# Patient Record
Sex: Male | Born: 1935 | Race: White | Hispanic: No | Marital: Married | Smoking: Current every day smoker
Health system: Southern US, Community
[De-identification: ages and names within clinical notes are randomized; demographics above are authoritative.]

## PROBLEM LIST (undated history)

## (undated) DIAGNOSIS — I2510 Atherosclerotic heart disease of native coronary artery without angina pectoris: Secondary | ICD-10-CM

## (undated) DIAGNOSIS — E7800 Pure hypercholesterolemia, unspecified: Secondary | ICD-10-CM

## (undated) DIAGNOSIS — E118 Type 2 diabetes mellitus with unspecified complications: Secondary | ICD-10-CM

## (undated) DIAGNOSIS — I6529 Occlusion and stenosis of unspecified carotid artery: Secondary | ICD-10-CM

## (undated) DIAGNOSIS — I10 Essential (primary) hypertension: Secondary | ICD-10-CM

## (undated) DIAGNOSIS — I480 Paroxysmal atrial fibrillation: Secondary | ICD-10-CM

## (undated) DIAGNOSIS — I5032 Chronic diastolic (congestive) heart failure: Secondary | ICD-10-CM

## (undated) HISTORY — DX: Paroxysmal atrial fibrillation: I48.0

## (undated) HISTORY — DX: Atherosclerotic heart disease of native coronary artery without angina pectoris: I25.10

## (undated) HISTORY — DX: Essential (primary) hypertension: I10

## (undated) HISTORY — DX: Chronic diastolic (congestive) heart failure: I50.32

## (undated) HISTORY — DX: Pure hypercholesterolemia, unspecified: E78.00

## (undated) HISTORY — DX: Occlusion and stenosis of unspecified carotid artery: I65.29

## (undated) HISTORY — DX: Type 2 diabetes mellitus with unspecified complications: E11.8

## (undated) HISTORY — PX: BACK SURGERY: SHX140

## (undated) HISTORY — PX: CORONARY ANGIOPLASTY WITH STENT PLACEMENT: SHX49

---

## 2004-01-02 ENCOUNTER — Other Ambulatory Visit: Payer: Self-pay

## 2004-01-03 ENCOUNTER — Other Ambulatory Visit: Payer: Self-pay

## 2005-10-07 ENCOUNTER — Ambulatory Visit: Payer: Self-pay | Admitting: Gastroenterology

## 2007-07-23 HISTORY — PX: CARDIAC CATHETERIZATION: SHX172

## 2007-07-26 ENCOUNTER — Ambulatory Visit: Payer: Self-pay | Admitting: Cardiovascular Disease

## 2011-02-03 ENCOUNTER — Ambulatory Visit: Payer: Self-pay | Admitting: Ophthalmology

## 2011-03-10 ENCOUNTER — Ambulatory Visit: Payer: Self-pay | Admitting: Ophthalmology

## 2011-07-23 HISTORY — PX: CARDIAC CATHETERIZATION: SHX172

## 2011-08-02 ENCOUNTER — Ambulatory Visit: Payer: Self-pay | Admitting: Cardiovascular Disease

## 2013-03-12 ENCOUNTER — Encounter: Payer: Self-pay | Admitting: *Deleted

## 2013-03-19 ENCOUNTER — Ambulatory Visit (INDEPENDENT_AMBULATORY_CARE_PROVIDER_SITE_OTHER): Payer: Medicare Other | Admitting: Cardiovascular Disease

## 2013-03-19 ENCOUNTER — Encounter: Payer: Self-pay | Admitting: Cardiovascular Disease

## 2013-03-19 VITALS — BP 161/87 | HR 75 | Ht 63.5 in | Wt 157.5 lb

## 2013-03-19 DIAGNOSIS — R0602 Shortness of breath: Secondary | ICD-10-CM

## 2013-03-19 DIAGNOSIS — I6529 Occlusion and stenosis of unspecified carotid artery: Secondary | ICD-10-CM | POA: Insufficient documentation

## 2013-03-19 DIAGNOSIS — I493 Ventricular premature depolarization: Secondary | ICD-10-CM | POA: Insufficient documentation

## 2013-03-19 DIAGNOSIS — I2510 Atherosclerotic heart disease of native coronary artery without angina pectoris: Secondary | ICD-10-CM | POA: Insufficient documentation

## 2013-03-19 DIAGNOSIS — I4949 Other premature depolarization: Secondary | ICD-10-CM

## 2013-03-19 NOTE — Assessment & Plan Note (Signed)
Recent ejection fraction was normal. Symptoms are overall mild. I'm hesitant to increase the dose of metoprolol given that presence of first-degree AV block with a very prolonged PR interval. I don't think we have to be aggressive in treating this unless he had more than 10,000 PVCs and 24 hours. The fact that his ejection fraction recently was normal is reassuring. I will continue to follow the patient closely and consider an antiarrhythmic medication if PVC burden increases.

## 2013-03-19 NOTE — Assessment & Plan Note (Signed)
Recent abnormal nuclear stress test is explainable by his most recent cardiac catheterization with known chronically occluded right coronary artery and high-grade chronic stenosis in the OM1. Continue medical therapy.

## 2013-03-19 NOTE — Assessment & Plan Note (Signed)
He reports no history of carotid artery disease with no previous history of stroke. I will be happy to follow this in our office if desired. Revascularization is only recommended if there is high-grade stenosis with acceptable surgical risk.

## 2013-03-19 NOTE — Progress Notes (Signed)
HPI  This is a 79 year old man who was referred by Dr.Jadali for evaluation of PVCs and abnormal stress test. The patient has known history of coronary artery disease with previous stenting of the right coronary artery years ago. Most recent cardiac catheterization was done in February of 2013 which showed an occluded RCA with left to right collaterals, high-grade complex disease in the OM1 which is known to be chronic, normal ejection fraction. The patient was treated medically. He has known history of hypertension, diabetes and hyperlipidemia as well as asymptomatic carotid disease. Recently, he was noted to have frequent PVCs with mild dizziness. He underwent a treadmill nuclear stress test and was able to exercise for 3 minutes with a peak heart rate of 120 beats per minute. Ejection fraction was noted to be 69% with large inferoapical scar with mild ischemia.  The patient denies chest pain at this time. He has mild exertional dyspnea. He reports that palpitations and dizziness have improved.  No Known Allergies   Current Outpatient Prescriptions on File Prior to Visit  Medication Sig Dispense Refill  . amLODipine-benazepril (LOTREL) 5-20 MG per capsule Take 1 capsule by mouth daily.      Marland Kitchen atorvastatin (LIPITOR) 40 MG tablet Take 40 mg by mouth daily.      . clopidogrel (PLAVIX) 75 MG tablet Take 75 mg by mouth daily.      Marland Kitchen glipiZIDE (GLUCOTROL) 5 MG tablet Take 5 mg by mouth daily.      . metFORMIN (GLUCOPHAGE) 1000 MG tablet Take 1,000 mg by mouth 2 (two) times daily with a meal.      . metoprolol tartrate (LOPRESSOR) 25 MG tablet Take 25 mg by mouth 2 (two) times daily.      . saxagliptin HCl (ONGLYZA) 5 MG TABS tablet Take 5 mg by mouth daily.       No current facility-administered medications on file prior to visit.     Past Medical History  Diagnosis Date  . Hypertension   . Diabetes mellitus without complication   . Hypercholesterolemia   . Carotid artery stenosis   .  Coronary artery disease     Cardiac cath in February of 2013: EF 60%, high-grade chronic disease in OM 1, occluded mid RCA at the site of a previously placed stent with left-to-right collaterals, occluded left SFA.     Past Surgical History  Procedure Laterality Date  . Cardiac catheterization  07/2007    [facility]  . Cardiac catheterization  07/2011    [facility]     Family History  Problem Relation Age of Onset  . Heart attack Father      History   Social History  . Marital Status: Married    Spouse Name: N/A    Number of Children: N/A  . Years of Education: N/A   Occupational History  . Not on file.   Social History Main Topics  . Smoking status: Current Every Day Smoker -- 1.00 packs/day for 50 years    Types: Cigarettes  . Smokeless tobacco: Not on file  . Alcohol Use: No  . Drug Use: No  . Sexual Activity: Not on file   Other Topics Concern  . Not on file   Social History Narrative  . No narrative on file     ROS A 10 point review of system was performed. It is negative other than what is mentioned in history of present illness.  PHYSICAL EXAM   BP 161/87  Pulse 75  Ht 5' 3.5" (1.613 m)  Wt 157 lb 8 oz (71.442 kg)  BMI 27.46 kg/m2 Constitutional: He is oriented to person, place, and time. He appears well-developed and well-nourished. No distress.  HENT: No nasal discharge.  Head: Normocephalic and atraumatic.  Eyes: Pupils are equal and round. Right eye exhibits no discharge. Left eye exhibits no discharge.  Neck: Normal range of motion. Neck supple. No JVD present. No thyromegaly present.  Cardiovascular: Normal rate, regular rhythm, normal heart sounds and. Exam reveals no gallop and no friction rub. No murmur heard.  Pulmonary/Chest: Effort normal and breath sounds normal. No stridor. No respiratory distress. He has no wheezes. He has no rales. He exhibits no tenderness.  Abdominal: Soft. Bowel sounds are normal. He exhibits no distension. There is no  tenderness. There is no rebound and no guarding.  Musculoskeletal: Normal range of motion. He exhibits no edema and no tenderness.  Neurological: He is alert and oriented to person, place, and time. Coordination normal.  Skin: Skin is warm and dry. No rash noted. He is not diaphoretic. No erythema. No pallor.  Psychiatric: He has a normal mood and affect. His behavior is normal. Judgment and thought content normal.       [id number]  Rhythm  -First degree A-V block  - occasional PVCs  PRi = 254  # PACs = 1. -Right bundle branch block.   ABNORMAL    ASSESSMENT AND PLAN

## 2013-03-19 NOTE — Patient Instructions (Addendum)
Continue same medications.   Your physician wants you to follow-up in: 6 months.  You will receive a reminder letter in the mail two months in advance. If you don't receive a letter, please call our office to schedule the follow-up appointment.  

## 2014-01-18 ENCOUNTER — Ambulatory Visit: Payer: Self-pay | Admitting: Internal Medicine

## 2014-01-23 ENCOUNTER — Ambulatory Visit: Payer: Self-pay | Admitting: Internal Medicine

## 2014-01-23 LAB — CBC CANCER CENTER
BASOS PCT: 0.9 %
Basophil #: 0.1 x10 3/mm (ref 0.0–0.1)
EOS ABS: 0.2 x10 3/mm (ref 0.0–0.7)
Eosinophil %: 1.9 %
HCT: 38.7 % — ABNORMAL LOW (ref 40.0–52.0)
HGB: 12.8 g/dL — ABNORMAL LOW (ref 13.0–18.0)
LYMPHS ABS: 1.7 x10 3/mm (ref 1.0–3.6)
LYMPHS PCT: 18.3 %
MCH: 29.7 pg (ref 26.0–34.0)
MCHC: 33.1 g/dL (ref 32.0–36.0)
MCV: 90 fL (ref 80–100)
MONOS PCT: 6.2 %
Monocyte #: 0.6 x10 3/mm (ref 0.2–1.0)
NEUTROS ABS: 6.6 x10 3/mm — AB (ref 1.4–6.5)
Neutrophil %: 72.7 %
PLATELETS: 163 x10 3/mm (ref 150–440)
RBC: 4.32 10*6/uL — ABNORMAL LOW (ref 4.40–5.90)
RDW: 14.4 % (ref 11.5–14.5)
WBC: 9.1 x10 3/mm (ref 3.8–10.6)

## 2014-01-23 LAB — PROTIME-INR
INR: 1
Prothrombin Time: 12.6 secs (ref 11.5–14.7)

## 2014-01-23 LAB — APTT: ACTIVATED PTT: 28.1 s (ref 23.6–35.9)

## 2014-02-13 LAB — CBC CANCER CENTER
BASOS PCT: 1.1 %
Basophil #: 0.1 x10 3/mm (ref 0.0–0.1)
EOS ABS: 0.2 x10 3/mm (ref 0.0–0.7)
Eosinophil %: 2.4 %
HCT: 39.5 % — AB (ref 40.0–52.0)
HGB: 13.1 g/dL (ref 13.0–18.0)
Lymphocyte #: 1.6 x10 3/mm (ref 1.0–3.6)
Lymphocyte %: 20 %
MCH: 29.5 pg (ref 26.0–34.0)
MCHC: 33.2 g/dL (ref 32.0–36.0)
MCV: 89 fL (ref 80–100)
Monocyte #: 0.6 x10 3/mm (ref 0.2–1.0)
Monocyte %: 7.4 %
Neutrophil #: 5.7 x10 3/mm (ref 1.4–6.5)
Neutrophil %: 69.1 %
Platelet: 166 x10 3/mm (ref 150–440)
RBC: 4.45 10*6/uL (ref 4.40–5.90)
RDW: 14.7 % — ABNORMAL HIGH (ref 11.5–14.5)
WBC: 8.3 x10 3/mm (ref 3.8–10.6)

## 2014-02-13 LAB — IRON AND TIBC
IRON BIND. CAP.(TOTAL): 324 ug/dL (ref 250–450)
IRON SATURATION: 26 %
Iron: 84 ug/dL (ref 65–175)
Unbound Iron-Bind.Cap.: 240 ug/dL

## 2014-02-13 LAB — APTT: Activated PTT: 26.8 secs (ref 23.6–35.9)

## 2014-02-13 LAB — PROTIME-INR
INR: 1
PROTHROMBIN TIME: 12.7 s (ref 11.5–14.7)

## 2014-02-13 LAB — FERRITIN: Ferritin (ARMC): 46 ng/mL (ref 8–388)

## 2014-02-19 ENCOUNTER — Ambulatory Visit: Payer: Self-pay | Admitting: Internal Medicine

## 2015-01-13 ENCOUNTER — Encounter: Payer: Self-pay | Admitting: Cardiovascular Disease

## 2015-01-13 ENCOUNTER — Ambulatory Visit (INDEPENDENT_AMBULATORY_CARE_PROVIDER_SITE_OTHER): Payer: Medicare HMO | Admitting: Cardiovascular Disease

## 2015-01-13 VITALS — BP 130/60 | HR 72 | Ht 63.0 in | Wt 146.2 lb

## 2015-01-13 DIAGNOSIS — I6529 Occlusion and stenosis of unspecified carotid artery: Secondary | ICD-10-CM | POA: Diagnosis not present

## 2015-01-13 DIAGNOSIS — I493 Ventricular premature depolarization: Secondary | ICD-10-CM | POA: Diagnosis not present

## 2015-01-13 DIAGNOSIS — I25119 Atherosclerotic heart disease of native coronary artery with unspecified angina pectoris: Secondary | ICD-10-CM | POA: Diagnosis not present

## 2015-01-13 DIAGNOSIS — I10 Essential (primary) hypertension: Secondary | ICD-10-CM | POA: Diagnosis not present

## 2015-01-13 NOTE — Patient Instructions (Signed)
Medication Instructions: Continue same medications.   Labwork: None.   Procedures/Testing: None.   Follow-Up: 1 year with Dr. Arida  Any Additional Special Instructions Will Be Listed Below (If Applicable).   

## 2015-01-13 NOTE — Assessment & Plan Note (Signed)
Consider repeat carotid Doppler if this has not been checked in the last year.

## 2015-01-13 NOTE — Assessment & Plan Note (Signed)
Blood pressure is well controlled on current medications. 

## 2015-01-13 NOTE — Assessment & Plan Note (Signed)
The patient did have a recent abnormal nuclear stress test. However, this is not much different from his most recent stress test in 2014 and can be explained based on most recent cardiac catheterization in 2013. The patient currently has no symptoms suggestive of angina and thus I recommend continuing aggressive medical therapy as is being done. Cardiac catheterization can be considered if he develops anginal symptoms. I will have him follow-up with me on a yearly basis.

## 2015-01-13 NOTE — Progress Notes (Signed)
HPI  This is a 81 year old man who was referred by Dr.Jadali for evaluation of an abnormal stress test. The patient has known history of coronary artery disease with previous stenting of the right coronary artery years ago. Most recent cardiac catheterization was done in February of 2013 which showed an occluded RCA with left to right collaterals, high-grade complex disease in the OM1 which is known to be chronic, normal ejection fraction. The patient was treated medically. He has known history of hypertension, diabetes, PAD and hyperlipidemia as well as asymptomatic carotid disease.  He was most recently seen by me in 2014 for asymptomatic PVCs. He was treated medically. He recently complained of some mild dizziness. He underwent a treadmill nuclear stress test which showed moderate to large inferior septal and inferolateral fixed defect with mild reversibility. Ejection fraction was normal. This abnormal stress test does not seem to be much different from the most recent one in 2014. The patient completely denies any chest pain or shortness of breath. He is physically very active and is able to do all his yard work without limitations.  No Known Allergies   Current Outpatient Prescriptions on File Prior to Visit  Medication Sig Dispense Refill  . amLODipine-benazepril (LOTREL) 5-20 MG per capsule Take 1 capsule by mouth daily.    Marland Kitchen aspirin 81 MG tablet Take 81 mg by mouth daily.    Marland Kitchen atorvastatin (LIPITOR) 40 MG tablet Take 40 mg by mouth daily.    . clopidogrel (PLAVIX) 75 MG tablet Take 75 mg by mouth daily.    . fish oil-omega-3 fatty acids 1000 MG capsule Take 2 g by mouth daily.    Marland Kitchen glipiZIDE (GLUCOTROL) 5 MG tablet Take 5 mg by mouth daily.    . lansoprazole (PREVACID) 15 MG capsule Take 15 mg by mouth daily.    . metFORMIN (GLUCOPHAGE) 1000 MG tablet Take 1,000 mg by mouth 2 (two) times daily with a meal.    . metoprolol tartrate (LOPRESSOR) 25 MG tablet Take 25 mg by mouth 2  (two) times daily.    . saxagliptin HCl (ONGLYZA) 5 MG TABS tablet Take 5 mg by mouth daily.     No current facility-administered medications on file prior to visit.     Past Medical History  Diagnosis Date  . Hypertension   . Diabetes mellitus without complication   . Hypercholesterolemia   . Carotid artery stenosis   . Coronary artery disease     Cardiac cath in February of 2013: EF 60%, high-grade chronic disease in OM 1, occluded mid RCA at the site of a previously placed stent with left-to-right collaterals, occluded left SFA.     Past Surgical History  Procedure Laterality Date  . Cardiac catheterization  07/2007    [facility]  . Cardiac catheterization  07/2011    [facility]  . Coronary angioplasty with stent placement       Family History  Problem Relation Age of Onset  . Heart attack Father      History   Social History  . Marital Status: Married    Spouse Name: N/A  . Number of Children: N/A  . Years of Education: N/A   Occupational History  . Not on file.   Social History Main Topics  . Smoking status: Current Every Day Smoker -- 1.00 packs/day for 50 years    Types: Cigarettes  . Smokeless tobacco: Not on file  . Alcohol Use: No  . Drug Use: No  .  Sexual Activity: Not on file   Other Topics Concern  . Not on file   Social History Narrative     ROS A 10 point review of system was performed. It is negative other than what is mentioned in history of present illness.  PHYSICAL EXAM   BP 130/60 mmHg  Pulse 72  Ht  (1.6 m)  Wt 146 lb 4 oz (66.339 kg)  BMI 25.91 kg/m2 Constitutional: He is oriented to person, place, and time. He appears well-developed and well-nourished. No distress.  HENT: No nasal discharge.  Head: Normocephalic and atraumatic.  Eyes: Pupils are equal and round. Right eye exhibits no discharge. Left eye exhibits no discharge.  Neck: Normal range of motion. Neck supple. No JVD present. No thyromegaly present.  Cardiovascular:  Normal rate, regular rhythm, normal heart sounds and. Exam reveals no gallop and no friction rub. No murmur heard.  Pulmonary/Chest: Effort normal and breath sounds normal. No stridor. No respiratory distress. He has no wheezes. He has no rales. He exhibits no tenderness.  Abdominal: Soft. Bowel sounds are normal. He exhibits no distension. There is no tenderness. There is no rebound and no guarding.  Musculoskeletal: Normal range of motion. He exhibits no edema and no tenderness.  Neurological: He is alert and oriented to person, place, and time. Coordination normal.  Skin: Skin is warm and dry. No rash noted. He is not diaphoretic. No erythema. No pallor.  Psychiatric: He has a normal mood and affect. His behavior is normal. Judgment and thought content normal.       ZOX:[id number]  Rhythm  -First degree A-V block  PRi = 260 -Right bundle branch block.   ABNORMAL    ASSESSMENT AND PLAN

## 2015-01-16 ENCOUNTER — Encounter: Payer: Self-pay | Admitting: Urology

## 2015-01-16 ENCOUNTER — Ambulatory Visit (INDEPENDENT_AMBULATORY_CARE_PROVIDER_SITE_OTHER): Payer: Medicare HMO | Admitting: Urology

## 2015-01-16 VITALS — BP 144/79 | HR 66 | Ht 63.0 in | Wt 144.4 lb

## 2015-01-16 DIAGNOSIS — N138 Other obstructive and reflux uropathy: Secondary | ICD-10-CM | POA: Insufficient documentation

## 2015-01-16 DIAGNOSIS — N401 Enlarged prostate with lower urinary tract symptoms: Secondary | ICD-10-CM

## 2015-01-16 DIAGNOSIS — R351 Nocturia: Secondary | ICD-10-CM | POA: Diagnosis not present

## 2015-01-16 DIAGNOSIS — N358 Other urethral stricture: Secondary | ICD-10-CM | POA: Diagnosis not present

## 2015-01-16 DIAGNOSIS — IMO0002 Reserved for concepts with insufficient information to code with codable children: Secondary | ICD-10-CM | POA: Insufficient documentation

## 2015-01-16 LAB — BLADDER SCAN AMB NON-IMAGING: Scan Result: 105

## 2015-01-16 MED ORDER — FINASTERIDE 5 MG PO TABS: 5.0000 mg | ORAL_TABLET | Freq: Every day | ORAL | Status: DC

## 2015-01-16 MED ORDER — TAMSULOSIN HCL 0.4 MG PO CAPS: 0.4000 mg | ORAL_CAPSULE | Freq: Every day | ORAL | Status: DC

## 2015-01-16 NOTE — Progress Notes (Signed)
01/16/2015 11:21 AM   Gary Lynn 1931-01-30 [id number]  Referring provider: Sherrie Mustache, MD [address]   [place], [place] [address]  Chief Complaint  Patient presents with  . Benign Prostatic Hypertrophy    f/u urthera stricture     HPI: Gary Lynn is an 81 year old white male with a h/o urethral stricture and BPH with LUTS who presents today for follow up.  Patient underwent a difficult foley placement with filiform's and followers in 2014 by Dr. Edwyna Shell for a PVR of 500 mL.  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  He had the foley in place for several days and was started on tamsulosin and finasteride.  He currently denies any difficulty with urination.  His IPSS score today is 12, which is moderate lower urinary tract symptomatology. He is mostly dissatisfied with his quality life due to his urinary symptoms. His PVR is 105 mL.   His previous PVR is 218  ML 6 months prior.    His major complaint today nocturia. He has been getting up four times nightly.  He denies any day time frequency.   He has had these symptoms for several years.  He denies any dysuria, hematuria or suprapubic pain.  His PCP has been encouraging him to obtain a sleep study.  He currently taking tamsulosin and finasteride.  His has had urethral dilation in 2014.  Previous PSA's:     3.0 ng/mL on 03/20/2014  He also denies any recent fevers, chills, nausea or vomiting.   He does not have a family history of PCa.      IPSS      01/16/15 1000       International Prostate Symptom Score   How often have you had the sensation of not emptying your bladder? Less than 1 in 5     How often have you had to urinate less than every two hours? About half the time     How often have you found you stopped and started again several times when you urinated? Less than 1 in 5 times     How often have you found it difficult to postpone urination? Less than 1 in 5 times     How often have you  had a weak urinary stream? Less than 1 in 5 times     How often have you had to strain to start urination? Less than 1 in 5 times     How many times did you typically get up at night to urinate? 4 Times     Total IPSS Score 12     Quality of Life due to urinary symptoms   If you were to spend the rest of your life with your urinary condition just the way it is now how would you feel about that? Mostly Disatisfied        Score:  1-7 Mild 8-19 Moderate 20-35 Severe     PMH: Past Medical History  Diagnosis Date  . Hypertension   . Diabetes mellitus without complication   . Hypercholesterolemia   . Carotid artery stenosis   . Coronary artery disease     Cardiac cath in February of 2013: EF 60%, high-grade chronic disease in OM 1, occluded mid RCA at the site of a previously placed stent with left-to-right collaterals, occluded left SFA.    Surgical History: Past Surgical History  Procedure Laterality Date  . Cardiac catheterization  07/2007  [facility]  . Cardiac catheterization  07/2011    [facility]  . Coronary angioplasty with stent placement      Home Medications:    Medication List       This list is accurate as of: 01/16/15 11:21 AM.  Always use your most recent med list.               amLODipine-benazepril 5-20 MG per capsule  Commonly known as:  LOTREL  Take 1 capsule by mouth daily.     aspirin 81 MG tablet  Take 81 mg by mouth daily.     atorvastatin 40 MG tablet  Commonly known as:  LIPITOR  Take 40 mg by mouth daily.     clopidogrel 75 MG tablet  Commonly known as:  PLAVIX  Take 75 mg by mouth daily.     finasteride 5 MG tablet  Commonly known as:  PROSCAR  Take 1 tablet (5 mg total) by mouth daily.     fish oil-omega-3 fatty acids 1000 MG capsule  Take 2 g by mouth daily.     glipiZIDE 5 MG 24 hr tablet  Commonly known as:  GLUCOTROL XL     lansoprazole 15 MG capsule  Commonly known as:  PREVACID  Take 15 mg by mouth daily.     levocetirizine  5 MG tablet  Commonly known as:  XYZAL  Take 5 mg by mouth every evening.     metFORMIN 1000 MG tablet  Commonly known as:  GLUCOPHAGE  Take 1,000 mg by mouth 2 (two) times daily with a meal.     metoprolol tartrate 25 MG tablet  Commonly known as:  LOPRESSOR  Take 25 mg by mouth 2 (two) times daily.     ONETOUCH VERIO test strip  Generic drug:  glucose blood     ONGLYZA 5 MG Tabs tablet  Generic drug:  saxagliptin HCl  Take 5 mg by mouth daily.     tamsulosin 0.4 MG Caps capsule  Commonly known as:  FLOMAX  Take 1 capsule (0.4 mg total) by mouth daily.     TRADJENTA 5 MG Tabs tablet  Generic drug:  linagliptin  Take 5 mg by mouth daily.        Allergies: No Known Allergies  Family History: Family History  Problem Relation Age of Onset  . Heart attack Father     Social History:  reports that he has been smoking Cigarettes.  He has a 25 pack-year smoking history. He does not have any smokeless tobacco history on file. He reports that he does not drink alcohol or use illicit drugs.  ROS: UROLOGY Frequent Urination?: No Hard to postpone urination?: No Burning/pain with urination?: No Get up at night to urinate?: Yes Leakage of urine?: No Urine stream starts and stops?: No Trouble starting stream?: No Do you have to strain to urinate?: No Blood in urine?: No Urinary tract infection?: No Sexually transmitted disease?: No Injury to kidneys or bladder?: No Painful intercourse?: No Weak stream?: No Erection problems?: No Penile pain?: No  Gastrointestinal Nausea?: No Vomiting?: No Indigestion/heartburn?: No Diarrhea?: No Constipation?: No  Constitutional Fever: No Night sweats?: No Weight loss?: No Fatigue?: No  Skin Skin rash/lesions?: No Itching?: No  Eyes Blurred vision?: No Double vision?: No  Ears/Nose/Throat Sore throat?: No Sinus problems?: No  Hematologic/Lymphatic Swollen glands?: No Easy bruising?: Yes  Cardiovascular Leg  swelling?: No Chest pain?: No  Respiratory Cough?: Yes Shortness of breath?: No  Endocrine Excessive thirst?:  No  Musculoskeletal Back pain?: No Joint pain?: No  Neurological Headaches?: No Dizziness?: No  Psychologic Depression?: No Anxiety?: No  Physical Exam: BP 144/79 mmHg  Pulse 66  Ht 5\' 3"  (1.6 m)  Wt 144 lb 6.4 oz (65.499 kg)  BMI 25.59 kg/m2  GU: Patient with uncircumcised phallus. Foreskin easily retracted  Urethral meatus is patent.  No penile discharge. No penile lesions or rashes. Scrotum without lesions, cysts, rashes and/or edema.  Testicles are located scrotally bilaterally and atrophic.  No masses are appreciated in the testicles. Left and right epididymis are normal. Rectal: Patient with  normal sphincter tone. Perineum without scarring or rashes. No rectal masses are appreciated. Prostate is approximately 50 grams, no nodules are appreciated. Seminal vesicles are normal.   Laboratory Data: Results for orders placed or performed in visit on 01/16/15  Bladder Scan (Post Void Residual) in office  Result Value Ref Range   Scan Result 105     Lab Results  Component Value Date   WBC 8.3 02/13/2014   HGB 13.1 02/13/2014   HCT 39.5* 02/13/2014   MCV 89 02/13/2014   PLT 166 02/13/2014    No results found for: CREATININE  No results found for: PSA  No results found for: TESTOSTERONE  No results found for: HGBA1C  Urinalysis No results found for: COLORURINE, APPEARANCEUR, LABSPEC, PHURINE, GLUCOSEU, HGBUR, BILIRUBINUR, KETONESUR, PROTEINUR, UROBILINOGEN, NITRITE, LEUKOCYTESUR  Pertinent Imaging:   Assessment & Plan:    1. BPH (benign prostatic hyperplasia) with LUTS:  Patient's IPSS score is 12/4.  His PVR 105.  His DRE demonstrates enlargement.  Patient like to continue  medical treatment.  He will continue  tamsulosin and finasteride.  I have refilled those medications.  He will follow up in 12 months for a  DRE, PVR and an IPSS.    -  Bladder Scan (Post Void Residual) in office  2. Other specified causes of urethral stricture:  Patient is voiding well and his PVR is improving.  We will continue to monitor his PVR.  Patient to call the office if he should develop difficulty with urination or the complete inability to urinate.  3. Nocturia:   I explained to the patient that nocturia is multifactorial.  The fact that he is not experiencing a lot of daytime frequency points to possible sleep apnea.  He has also been encouraged by his PCP to obtain a sleep study.  He and his wife has heard from friends that the sleep study was a waist of time and sleeping with a CPAP machine is impossible.    I encouraged the patient to reconsider and have the sleep study.  He does not want to pursue one at this time.  He will continue the medications and follow up with [place] in one year.      No Follow-up on file.  Michiel Cowboy, PA-C  [facility] [address], [address] [place], [place] [address] [phone]

## 2015-06-19 ENCOUNTER — Ambulatory Visit
Admission: RE | Admit: 2015-06-19 | Discharge: 2015-06-19 | Disposition: A | Discharge status: Home / Self Care | Payer: Medicare HMO | Source: Ambulatory Visit | Attending: Internal Medicine | Admitting: Internal Medicine

## 2015-06-19 ENCOUNTER — Other Ambulatory Visit: Payer: Self-pay | Admitting: Internal Medicine

## 2015-06-19 DIAGNOSIS — R059 Cough, unspecified: Secondary | ICD-10-CM

## 2015-06-19 DIAGNOSIS — R05 Cough: Secondary | ICD-10-CM | POA: Diagnosis not present

## 2015-06-19 DIAGNOSIS — J449 Chronic obstructive pulmonary disease, unspecified: Secondary | ICD-10-CM | POA: Diagnosis not present

## 2016-01-14 NOTE — Progress Notes (Signed)
01/15/2016 10:55 AM   [name] Charon [birth year] [id number]  Referring provider: Sherrie Mustache, MD [address]   [place], [place] [address]  Chief Complaint  Patient presents with  . Benign Prostatic Hypertrophy    1 year follow up    HPI: Mr. Jepsen is an 82 year old Caucasian male with a h/o urethral stricture and BPH with LUTS who presents today for follow up.  History of urethral stricture Patient underwent a difficult foley placement with filiform's and followers in 2014 by Dr. Edwyna Shell for a PVR of 500 mL.  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  He had the foley in place for several days and was started on tamsulosin and finasteride.  He currently denies any difficulty with urination.  PVR is 33 mL.   BPH WITH LUTS His IPSS score today is 11, which is moderate lower urinary tract symptomatology. He is unhappy with his quality life due to his urinary symptoms.  His previous IPSS score was 12/4.  His previous PVR is 105 mL.  His major complaint today nocturia x 5.  He has had these symptoms for a good while.  He denies any dysuria, hematuria or suprapubic pain.  He currently taking tamsulosin 0.4 mg daily and finasteride 5 mg daily.  He also denies any recent fevers, chills, nausea or vomiting.  He does not have a family history of PCa.  Patient admits to drinking large amounts of sweet tea in the evenings.        IPSS    Row Name 01/15/16 1000         International Prostate Symptom Score   How often have you had the sensation of not emptying your bladder? Less than 1 in 5     How often have you had to urinate less than every two hours? About half the time     How often have you found you stopped and started again several times when you urinated? Not at All     How often have you found it difficult to postpone urination? Not at All     How often have you had a weak urinary stream? Less than half the time     How often have you had to strain to start  urination? Not at All     How many times did you typically get up at night to urinate? 5 Times     Total IPSS Score 11       Quality of Life due to urinary symptoms   If you were to spend the rest of your life with your urinary condition just the way it is now how would you feel about that? Unhappy        Score:  1-7 Mild 8-19 Moderate 20-35 Severe    PMH: Past Medical History:  Diagnosis Date  . Carotid artery stenosis   . Coronary artery disease    Cardiac cath in February of 2013: EF 60%, high-grade chronic disease in OM 1, occluded mid RCA at the site of a previously placed stent with left-to-right collaterals, occluded left SFA.  . Diabetes mellitus without complication (HCC)   . Hypercholesterolemia   . Hypertension     Surgical History: Past Surgical History:  Procedure Laterality Date  . BACK SURGERY     years ago  . CARDIAC CATHETERIZATION  07/2007   [facility]  . CARDIAC CATHETERIZATION  07/2011   [facility]  . CORONARY ANGIOPLASTY WITH STENT  PLACEMENT      Home Medications:    Medication List       Accurate as of 01/15/16 10:55 AM. Always use your most recent med list.          amLODipine-benazepril 5-20 MG capsule Commonly known as:  LOTREL Take 1 capsule by mouth daily.   atorvastatin 40 MG tablet Commonly known as:  LIPITOR Take 40 mg by mouth daily.   clopidogrel 75 MG tablet Commonly known as:  PLAVIX Take 75 mg by mouth daily.   finasteride 5 MG tablet Commonly known as:  PROSCAR Take 1 tablet (5 mg total) by mouth daily.   fish oil-omega-3 fatty acids 1000 MG capsule Take 2 g by mouth daily.   glipiZIDE 5 MG 24 hr tablet Commonly known as:  GLUCOTROL XL   IRON PO Take by mouth.   lansoprazole 15 MG capsule Commonly known as:  PREVACID Take 15 mg by mouth daily.   levocetirizine 5 MG tablet Commonly known as:  XYZAL Take 5 mg by mouth every evening.   metFORMIN 1000 MG tablet Commonly known as:  GLUCOPHAGE Take 1,000 mg by mouth 2  (two) times daily with a meal.   metoprolol tartrate 25 MG tablet Commonly known as:  LOPRESSOR Take 25 mg by mouth 2 (two) times daily.   ONETOUCH VERIO test strip Generic drug:  glucose blood   ONGLYZA 5 MG Tabs tablet Generic drug:  saxagliptin HCl Take 5 mg by mouth daily.   tamsulosin 0.4 MG Caps capsule Commonly known as:  FLOMAX Take 1 capsule (0.4 mg total) by mouth daily.   TRADJENTA 5 MG Tabs tablet Generic drug:  linagliptin Take 5 mg by mouth daily.       Allergies: No Known Allergies  Family History: Family History  Problem Relation Age of Onset  . Heart attack Father   . Kidney disease Neg Hx   . Prostate cancer Neg Hx     Social History:  reports that he has been smoking Cigarettes.  He has a 25.00 pack-year smoking history. He has never used smokeless tobacco. He reports that he does not drink alcohol or use drugs.  ROS: UROLOGY Frequent Urination?: Yes Hard to postpone urination?: No Burning/pain with urination?: No Get up at night to urinate?: No Leakage of urine?: No Urine stream starts and stops?: No Trouble starting stream?: No Do you have to strain to urinate?: No Blood in urine?: No Urinary tract infection?: No Sexually transmitted disease?: No Injury to kidneys or bladder?: No Painful intercourse?: No Weak stream?: No Erection problems?: No Penile pain?: No  Gastrointestinal Nausea?: No Vomiting?: No Indigestion/heartburn?: No Diarrhea?: No Constipation?: No  Constitutional Fever: No Night sweats?: No Weight loss?: No Fatigue?: No  Skin Skin rash/lesions?: No Itching?: No  Eyes Blurred vision?: No Double vision?: No  Ears/Nose/Throat Sore throat?: No Sinus problems?: No  Hematologic/Lymphatic Swollen glands?: No Easy bruising?: No  Cardiovascular Leg swelling?: No Chest pain?: No  Respiratory Cough?: No Shortness of breath?: No  Endocrine Excessive thirst?: No  Musculoskeletal Back pain?: No Joint  pain?: No  Neurological Headaches?: No Dizziness?: No  Psychologic Depression?: No Anxiety?: No  Physical Exam: BP (!) 135/55   Pulse 72   Ht 5\' 3"  (1.6 m)   Wt 139 lb 14.4 oz (63.5 kg)   BMI 24.78 kg/m   GU: Patient with uncircumcised phallus. Foreskin easily retracted  Urethral meatus is patent.  No penile discharge. No penile lesions or rashes. Scrotum without  lesions, cysts, rashes and/or edema.  Testicles are located scrotally bilaterally and atrophic.  No masses are appreciated in the testicles. Left and right epididymis are normal. Rectal: Patient with  normal sphincter tone. Perineum without scarring or rashes. No rectal masses are appreciated. Prostate is approximately 50 grams, no nodules are appreciated. Seminal vesicles are normal.   Laboratory Data:  Lab Results  Component Value Date   WBC 8.3 02/13/2014   HGB 13.1 02/13/2014   HCT 39.5 (L) 02/13/2014   MCV 89 02/13/2014   PLT 166 02/13/2014   Previous PSA's:     3.0 ng/mL on 03/20/2014   Pertinent Imaging: Results for CORRAN, LALONE (MRN [id number]) as of 01/15/2016 10:54  Ref. Range 01/15/2016 10:44  Scan Result Unknown 33    Assessment & Plan:    1. BPH with LUTS  - IPSS score is 11/5,  it is stable  - Continue conservative management, avoiding bladder irritants and timed voiding's  - Continue tamsulosin 0.4 mg daily and finasteride 5 mg daily  - RTC in 12 months for IPSS and exam   2. Other specified causes of urethral stricture:  Patient is voiding well and his PVR is improving.  We will continue to monitor his PVR.  Patient to call the office if he should develop difficulty with urination or the complete inability to urinate.  3. Nocturia:   I explained to the patient that nocturia is multifactorial.  The fact that he is not experiencing a lot of daytime frequency points to possible sleep apnea.  He has also been encouraged by his PCP to obtain a sleep study.  He and his wife has heard from friends  that the sleep study was a waist of time and sleeping with a CPAP machine is impossible.   I encouraged the patient to reconsider and have the sleep study.  He does not want to pursue one at this time.  I have advised him to stop drinking the sweet tea at noon and switching to water.  Then stop all fluids after 6 pm to see if this helps.  He will continue the medications and follow up with [place] in one year.      Return in about 1 year (around 01/14/2017) for IPSS, PVR and exam.  Michiel Cowboy, [facility]  [facility] [address], [address] [place], [place] [address] [phone]

## 2016-01-15 ENCOUNTER — Encounter: Payer: Self-pay | Admitting: Urology

## 2016-01-15 ENCOUNTER — Encounter: Payer: Self-pay | Admitting: Cardiovascular Disease

## 2016-01-15 ENCOUNTER — Ambulatory Visit (INDEPENDENT_AMBULATORY_CARE_PROVIDER_SITE_OTHER): Payer: Medicare HMO | Admitting: Urology

## 2016-01-15 ENCOUNTER — Ambulatory Visit (INDEPENDENT_AMBULATORY_CARE_PROVIDER_SITE_OTHER): Payer: Medicare HMO | Admitting: Cardiovascular Disease

## 2016-01-15 VITALS — BP 136/72 | HR 69 | Ht 63.0 in | Wt 140.2 lb

## 2016-01-15 VITALS — BP 135/55 | HR 72 | Ht 63.0 in | Wt 139.9 lb

## 2016-01-15 DIAGNOSIS — N401 Enlarged prostate with lower urinary tract symptoms: Secondary | ICD-10-CM | POA: Diagnosis not present

## 2016-01-15 DIAGNOSIS — I10 Essential (primary) hypertension: Secondary | ICD-10-CM | POA: Diagnosis not present

## 2016-01-15 DIAGNOSIS — N138 Other obstructive and reflux uropathy: Secondary | ICD-10-CM

## 2016-01-15 DIAGNOSIS — R351 Nocturia: Secondary | ICD-10-CM | POA: Diagnosis not present

## 2016-01-15 DIAGNOSIS — IMO0002 Reserved for concepts with insufficient information to code with codable children: Secondary | ICD-10-CM

## 2016-01-15 DIAGNOSIS — I25119 Atherosclerotic heart disease of native coronary artery with unspecified angina pectoris: Secondary | ICD-10-CM

## 2016-01-15 DIAGNOSIS — N358 Other urethral stricture: Secondary | ICD-10-CM

## 2016-01-15 DIAGNOSIS — I739 Peripheral vascular disease, unspecified: Secondary | ICD-10-CM

## 2016-01-15 DIAGNOSIS — I493 Ventricular premature depolarization: Secondary | ICD-10-CM

## 2016-01-15 DIAGNOSIS — I779 Disorder of arteries and arterioles, unspecified: Secondary | ICD-10-CM

## 2016-01-15 LAB — BLADDER SCAN AMB NON-IMAGING: SCAN RESULT: 33

## 2016-01-15 MED ORDER — FINASTERIDE 5 MG PO TABS: 5.0000 mg | ORAL_TABLET | Freq: Every day | ORAL | 4 refills | Status: DC

## 2016-01-15 MED ORDER — TAMSULOSIN HCL 0.4 MG PO CAPS: 0.4000 mg | ORAL_CAPSULE | Freq: Every day | ORAL | 4 refills | Status: DC

## 2016-01-15 NOTE — Progress Notes (Signed)
Cardiology Office Note   Date:  01/15/2016   ID:  Gary Lynn, DOB 1931-03-05, MRN [id number]  PCP:  Sherrie Mustache, MD  Cardiologist:   Lorine Bears, MD   Chief Complaint  Patient presents with  . Other    12 month follow up. Meds reviewed by the patient verbally. "doing well."       History of Present Illness: Gary Lynn is a 82 y.o. male who presents for a follow-up visit regarding coronary artery disease. The patient has known history of coronary artery disease with previous stenting of the right coronary artery years ago. Most recent cardiac catheterization was done in February of 2013 which showed an occluded RCA with left to right collaterals, high-grade complex disease in the OM1 which is known to be chronic, normal ejection fraction. The patient was treated medically. He has known history of hypertension, diabetes, PVCs, PAD and hyperlipidemia as well as asymptomatic carotid disease.  He was most recently seen by me in 2014 for asymptomatic PVCs. He was treated medically. Most recent nuclear stress test in 2016 showed moderate to large inferior septal and inferolateral fixed defect with mild reversibility. Ejection fraction was normal. It was not much different from the most recent one in 2014. The patient completely denies any chest pain or shortness of breath.  He continues to complain of dizziness with activities. No syncopal episode. He also complains of significant bruising especially after he works in the yard. He continues to be his yard work without significant limitations.  Past Medical History:  Diagnosis Date  . Carotid artery stenosis   . Coronary artery disease    Cardiac cath in February of 2013: EF 60%, high-grade chronic disease in OM 1, occluded mid RCA at the site of a previously placed stent with left-to-right collaterals, occluded left SFA.  . Diabetes mellitus without complication (HCC)   . Hypercholesterolemia   . Hypertension     Past  Surgical History:  Procedure Laterality Date  . CARDIAC CATHETERIZATION  07/2007   [facility]  . CARDIAC CATHETERIZATION  07/2011   [facility]  . CORONARY ANGIOPLASTY WITH STENT PLACEMENT       Current Outpatient Prescriptions  Medication Sig Dispense Refill  . amLODipine-benazepril (LOTREL) 5-20 MG per capsule Take 1 capsule by mouth daily.    Marland Kitchen aspirin 81 MG tablet Take 81 mg by mouth daily.    Marland Kitchen atorvastatin (LIPITOR) 40 MG tablet Take 40 mg by mouth daily.    . clopidogrel (PLAVIX) 75 MG tablet Take 75 mg by mouth daily.    . finasteride (PROSCAR) 5 MG tablet Take 1 tablet (5 mg total) by mouth daily. 90 tablet 3  . fish oil-omega-3 fatty acids 1000 MG capsule Take 2 g by mouth daily.    Marland Kitchen glipiZIDE (GLUCOTROL XL) 5 MG 24 hr tablet     . lansoprazole (PREVACID) 15 MG capsule Take 15 mg by mouth daily.    Marland Kitchen levocetirizine (XYZAL) 5 MG tablet Take 5 mg by mouth every evening.    . linagliptin (TRADJENTA) 5 MG TABS tablet Take 5 mg by mouth daily.    . metFORMIN (GLUCOPHAGE) 1000 MG tablet Take 1,000 mg by mouth 2 (two) times daily with a meal.    . metoprolol tartrate (LOPRESSOR) 25 MG tablet Take 25 mg by mouth 2 (two) times daily.    Letta Pate VERIO test strip     . saxagliptin HCl (ONGLYZA) 5 MG TABS tablet Take 5 mg by mouth  daily.    . tamsulosin (FLOMAX) 0.4 MG CAPS capsule Take 1 capsule (0.4 mg total) by mouth daily. 90 capsule 3   No current facility-administered medications for this visit.     Allergies:   Review of patient's allergies indicates no known allergies.    Social History:  The patient  reports that he has been smoking Cigarettes.  He has a 25.00 pack-year smoking history. He has never used smokeless tobacco. He reports that he does not drink alcohol or use drugs.   Family History:  The patient's family history includes Heart attack in his father.    ROS:  Please see the history of present illness.   Otherwise, review of systems are positive for none.   All other  systems are reviewed and negative.    PHYSICAL EXAM: VS:  BP 136/72 (BP Location: Left Arm, Patient Position: Sitting, Cuff Size: Normal)   Pulse 69   Ht 5\' 3"  (1.6 m)   Wt 140 lb 4 oz (63.6 kg)   BMI 24.84 kg/m  , BMI Body mass index is 24.84 kg/m. GEN: Well nourished, well developed, in no acute distress  HEENT: normal  Neck: no JVD, carotid bruits, or masses Cardiac: RRR; no murmurs, rubs, or gallops,no edema  Respiratory:  clear to auscultation bilaterally, normal work of breathing GI: soft, nontender, nondistended, + BS MS: no deformity or atrophy  Skin: warm and dry, no rash Neuro:  Strength and sensation are intact Psych: euthymic mood, full affect Right radial pulse is normal. Left radial pulse is diminished.   EKG:  EKG is ordered today. The ekg ordered today demonstrates normal sinus rhythm with first degree AV block and  right bundle branch block.   Recent Labs: No results found for requested labs within last 8760 hours.    Lipid Panel No results found for: CHOL, TRIG, HDL, CHOLHDL, VLDL, LDLCALC, LDLDIRECT    Wt Readings from Last 3 Encounters:  01/15/16 140 lb 4 oz (63.6 kg)  01/16/15 144 lb 6.4 oz (65.5 kg)  01/13/15 146 lb 4 oz (66.3 kg)         ASSESSMENT AND PLAN:  1.  Coronary artery disease involving native coronary arteries without angina: He has no anginal symptoms at the present time and he continues to be active. Continue medical therapy. Given that stenting was years ago, he does not need to be on dual antiplatelet therapy. Thus, I discontinued aspirin. I elected to keep him on Plavix given his history of carotid disease.  2. Carotid disease: No recent evaluation of this as far as I can tell from reviewing his chart. Given his increased dizziness and diminished left radial pulse, I requested carotid Doppler to evaluate his carotid arteries as well as his vertebral flow.  3. Essential hypertension: Blood pressure is well controlled on current  medications.  4. Hyperlipidemia: Continue treatment with atorvastatin with a target LDL of less than 70. This is being managed by Dr. Dario Guardian.     Disposition:   FU with me in 1 year  Signed,  Lorine Bears, MD  01/15/2016 8:55 AM    [facility]

## 2016-01-15 NOTE — Patient Instructions (Signed)
Medication Instructions:  Your physician has recommended you make the following change in your medication:  STOP taking aspirin   Labwork: none  Testing/Procedures: Your physician has requested that you have a carotid duplex. This test is an ultrasound of the carotid arteries in your neck. It looks at blood flow through these arteries that supply the brain with blood. Allow one hour for this exam. There are no restrictions or special instructions.    Follow-Up: Your physician wants you to follow-up in: one year with Dr. Kirke Corin. You will receive a reminder letter in the mail two months in advance. If you don't receive a letter, please call our office to schedule the follow-up appointment.   Any Other Special Instructions Will Be Listed Below (If Applicable).     If you need a refill on your cardiac medications before your next appointment, please call your pharmacy.

## 2016-02-17 ENCOUNTER — Telehealth: Payer: Self-pay | Admitting: Cardiovascular Disease

## 2016-02-17 NOTE — Telephone Encounter (Signed)
Pt cancelled US carotids scheduled for 8/14. S/w pt who states Dr. Dario GuardianJadali told him to cancel and they would discuss further at his appt in September. Advised pt to call our office when he would like to reschedule. Pt verbalized understanding with no further questions at this time.

## 2016-03-31 ENCOUNTER — Encounter: Payer: Self-pay | Admitting: Cardiovascular Disease

## 2016-05-20 ENCOUNTER — Encounter: Payer: Self-pay | Admitting: Physician Assistant

## 2016-05-20 ENCOUNTER — Telehealth: Payer: Self-pay | Admitting: Cardiovascular Disease

## 2016-05-20 ENCOUNTER — Ambulatory Visit (INDEPENDENT_AMBULATORY_CARE_PROVIDER_SITE_OTHER): Payer: Medicare HMO

## 2016-05-20 ENCOUNTER — Ambulatory Visit (INDEPENDENT_AMBULATORY_CARE_PROVIDER_SITE_OTHER): Payer: Medicare HMO | Admitting: Physician Assistant

## 2016-05-20 VITALS — BP 150/76 | HR 83 | Ht 63.0 in | Wt 145.5 lb

## 2016-05-20 DIAGNOSIS — I2510 Atherosclerotic heart disease of native coronary artery without angina pectoris: Secondary | ICD-10-CM | POA: Diagnosis not present

## 2016-05-20 DIAGNOSIS — R42 Dizziness and giddiness: Secondary | ICD-10-CM

## 2016-05-20 DIAGNOSIS — I10 Essential (primary) hypertension: Secondary | ICD-10-CM

## 2016-05-20 DIAGNOSIS — E782 Mixed hyperlipidemia: Secondary | ICD-10-CM

## 2016-05-20 DIAGNOSIS — I779 Disorder of arteries and arterioles, unspecified: Secondary | ICD-10-CM

## 2016-05-20 DIAGNOSIS — I739 Peripheral vascular disease, unspecified: Secondary | ICD-10-CM

## 2016-05-20 NOTE — Telephone Encounter (Signed)
S/w Devonn at Dr Aurelio BrashJadali's office. She said the patient was supposed to call and schedule the carotid dopplers which he had previously cancelled. Patient has not been seen by Dr Dario GuardianJadali since September but was there recently with his wife and asked if it was ok to have the dopplers. Dr Dario GuardianJadali had advised the patient to call our office to have them scheduled.

## 2016-05-20 NOTE — Telephone Encounter (Signed)
Pt calling stating his pcp (Dr. Dario GuardianJadali) told him to call us He states anytime he is up and around he has dizzy spells.  States that there has not been any changes in medication This is been going on for a little while  Would like to know what can be done  Please advise

## 2016-05-20 NOTE — Telephone Encounter (Signed)
S/w patient. He's been having frequent episodes of dizziness. It happens with change of position and sometimes when he is up moving around. It lasts for a few seconds and he stands still and it goes away. His PCP(Dr Dario GuardianJadali) is concerned about these symptoms and asked the pt to call us. Patient is not having any symptoms at this moment. It looks like patient cancelled carotid dopplers in the past per advice from his PCP. Patient has history of carotid artery disease.  Patient scheduled to see Eula Listenyan Dunn, PA today at 1430. Patient verbalized understanding of appt date and time. Will call Dr Aurelio BrashJadali's office to obtain recent records if possible.

## 2016-05-20 NOTE — Telephone Encounter (Signed)
S/w patient.  Advised patient he needed to reschedule carotid dopplers that were previously cancelled. Patient says Gary Dario GuardianJadali said he needed to be seen.  I gave him the information from the office and patient still preferred to be seen today. Patient will schedule to dopplers today after office visit if advised.  Patient's wife wanted to talk as well, ok per patient. She says that patient had dizziness yesterday, but hasn't been up much today yet. Hard to determine from patient and wife if this is a daily occurrence or the frequency; however, they continue to say "whenever I'm up and moving around."   Patient has office visit with Gary Listenyan Dunn, Gary Lynn at 1430 today.  Will route to Gary Lynn and Gary CelesteSharon Yow, Gary Lynn.

## 2016-05-20 NOTE — Progress Notes (Signed)
Cardiology Office Note Date:  05/20/2016  Patient ID:  Gary Lynn, DOB 02/28/1931, MRN [id number] PCP:  Sherrie MustacheFayegh Jadali  Cardiologist:  Dr. Kirke CorinArida, MD    Chief Complaint: Dizziness  History of Present Illness: Gary Lynn is a 82 y.o. male with history of CAD s/p prior stenting of the RCA years ago, asymptomatic PVCs, known dizziness with activities, HTN, HLD, DM2, and PAD with asymptomatic carotid artery disease who presents with complaints of dizziness.   Patient's most recent cardiac cath in 07/2011 showed an occluded RCA with left to right collaterals, high-grade complex disease in OM1 which was known to be chronic, and had a normal EF. He was medically managed. Most recent nuclear stress test in 2016 showed moderate to large inferior septal and inferolateral fixed defect with mild reversibility. EF was normal. It was not much different from study in 2014. At his last follow up with Dr. Kirke CorinArida on 01/15/2016 he continued to note his history of dizziness with activities without any syncope, chest pain, or SOB. Given his history of carotid artery disease (no prior carotid doppler on file for review) with diminished left radial pulse he was scheduled for carotid doppler to evaluate his carotid arteries as well as his vertebral flow. He cancelled this test and did not reschedule. He contacted his PCP regarding his ongoing dizziness who advised the patient needed to be seen.   Patient reports dizziness initially began sometime in the summer and was initially episodic, though only with ambulation. He now has dizziness every time he is up moving around on his feet that resolves with rest. Never with dizziness at rest. Some positional change dizziness as well. Never with chest pain, SOB, palpitations, nausea, or vomiting. He does report a single episode of possible syncope in the spring of 2017 while walking in his kitchen, though he is also not certain if he just fell. None since. He drinks a cup of  coffee in the morning followed by one to two 20 oz teas for the remainder of the day. Never with dizziness when elevates arms over his head. No vision or speech changes.    Past Medical History:  Diagnosis Date  . Carotid artery stenosis    a. no studies to review in epic  . Coronary artery disease    Cardiac cath in February of 2013: EF 60%, high-grade chronic disease in OM 1, occluded mid RCA at the site of a previously placed stent with left-to-right collaterals, occluded left SFA.  . Diabetes mellitus with complication (HCC)   . Hypercholesterolemia   . Hypertension     Past Surgical History:  Procedure Laterality Date  . BACK SURGERY     years ago  . CARDIAC CATHETERIZATION  07/2007   [facility]  . CARDIAC CATHETERIZATION  07/2011   [facility]  . CORONARY ANGIOPLASTY WITH STENT PLACEMENT      Current Outpatient Prescriptions  Medication Sig Dispense Refill  . amLODipine-benazepril (LOTREL) 5-20 MG per capsule Take 1 capsule by mouth daily.    Marland Kitchen. aspirin EC 81 MG tablet Take 81 mg by mouth daily.    Marland Kitchen. atorvastatin (LIPITOR) 40 MG tablet Take 40 mg by mouth daily.    . clopidogrel (PLAVIX) 75 MG tablet Take 75 mg by mouth daily.    . finasteride (PROSCAR) 5 MG tablet Take 1 tablet (5 mg total) by mouth daily. 90 tablet 4  . fish oil-omega-3 fatty acids 1000 MG capsule Take 2 g by mouth daily.    .Marland Kitchen  glipiZIDE (GLUCOTROL XL) 5 MG 24 hr tablet     . IRON PO Take by mouth.    . lansoprazole (PREVACID) 15 MG capsule Take 15 mg by mouth daily.    Marland Kitchen linagliptin (TRADJENTA) 5 MG TABS tablet Take 5 mg by mouth daily.    . metFORMIN (GLUCOPHAGE) 1000 MG tablet Take 1,000 mg by mouth 2 (two) times daily with a meal.    . metoprolol tartrate (LOPRESSOR) 25 MG tablet Take 25 mg by mouth 2 (two) times daily.    Letta Pate VERIO test strip     . saxagliptin HCl (ONGLYZA) 5 MG TABS tablet Take 5 mg by mouth daily.    . tamsulosin (FLOMAX) 0.4 MG CAPS capsule Take 1 capsule (0.4 mg total) by mouth daily.  90 capsule 4   No current facility-administered medications for this visit.     Allergies:   Patient has no known allergies.   Social History:  The patient  reports that he has been smoking Cigarettes.  He has a 25.00 pack-year smoking history. He has never used smokeless tobacco. He reports that he does not drink alcohol or use drugs.   Family History:  The patient's family history includes Heart attack in his father.  ROS:   Review of Systems  Constitutional: Positive for malaise/fatigue. Negative for chills, diaphoresis, fever and weight loss.  HENT: Negative for congestion.   Eyes: Negative for discharge and redness.  Respiratory: Negative for cough, hemoptysis, sputum production, shortness of breath and wheezing.   Cardiovascular: Negative for chest pain, palpitations, orthopnea, claudication, leg swelling and PND.  Gastrointestinal: Negative for abdominal pain, blood in stool, heartburn, melena, nausea and vomiting.  Genitourinary: Negative for hematuria.  Musculoskeletal: Negative for falls and myalgias.  Skin: Negative for rash.  Neurological: Positive for dizziness. Negative for tingling, tremors, sensory change, speech change, focal weakness, loss of consciousness and weakness.  Endo/Heme/Allergies: Does not bruise/bleed easily.  Psychiatric/Behavioral: Negative for substance abuse. The patient is not nervous/anxious.   All other systems reviewed and are negative.    PHYSICAL EXAM:  VS:  BP (!) 150/76 (BP Location: Left Arm, Patient Position: Sitting, Cuff Size: Normal)   Pulse 83   Ht 5\' 3"  (1.6 m)   Wt 145 lb 8 oz (66 kg)   BMI 25.77 kg/m  BMI: Body mass index is 25.77 kg/m.  Physical Exam  Constitutional: He is oriented to person, place, and time. He appears well-developed and well-nourished.  HENT:  Head: Normocephalic and atraumatic.  Eyes: Right eye exhibits no discharge. Left eye exhibits no discharge.  Neck: Normal range of motion. Normal carotid pulses and  no JVD present. Carotid bruit is not present.  Cardiovascular: Normal rate, regular rhythm, S1 normal, S2 normal and normal heart sounds.  Exam reveals no distant heart sounds, no friction rub, no midsystolic click and no opening snap.   No murmur heard. Pulses:      Radial pulses are 2+ on the right side, and 1+ on the left side.       Dorsalis pedis pulses are 2+ on the right side, and 2+ on the left side.  Pulmonary/Chest: Effort normal and breath sounds normal. No respiratory distress. He has no decreased breath sounds. He has no wheezes. He has no rales. He exhibits no tenderness.  Abdominal: Soft. He exhibits no distension. There is no tenderness.  Musculoskeletal: He exhibits no edema.  Neurological: He is alert and oriented to person, place, and time.  Skin: Skin is  warm and dry. No cyanosis. Nails show no clubbing.  Psychiatric: He has a normal mood and affect. His speech is normal and behavior is normal. Judgment and thought content normal.     EKG:  Was ordered and interpreted by me today. Shows NSR with 1st degree AV block, RBBB, left anterior fascicular block, 83 bpm, poor R wave progression  Recent Labs: No results found for requested labs within last 8760 hours.  No results found for requested labs within last 8760 hours.   CrCl cannot be calculated (No order found.).   Wt Readings from Last 3 Encounters:  05/20/16 145 lb 8 oz (66 kg)  01/15/16 139 lb 14.4 oz (63.5 kg)  01/15/16 140 lb 4 oz (63.6 kg)     Orthostatic Vital Signs: Lying: 174/83, 80 bpm Sitting: 148/74, 84 bpm Standing: 148/76, 88 bpm Standing x 3 minutes: 154/72, 87 bpm  Other studies reviewed: Additional studies/records reviewed today include: summarized above  ASSESSMENT AND PLAN:  1. Dizziness: Reports near daily occurrence now when ambulating as well as some orthostasis. Schedule carotid doppler, echocardiogram, and 14-day Zio patch monitor. Recent nuclear stress test in 2016 unchanged from  2014. No angina or SOB. Will hold off on stress testing at this time. He will increase his PO fluid intake.   2. Carotid artery disease: No studies on file for review. Recommend carotid doppler to evaluate carotid artery disease and vertebral flow as above.   3. CAD as above: No symptoms concerning for angina at this time. Continue current medications. No plans for further ischemic evaluation at this time.   4. Asymptomatic PVCs/PACs: On metoprolol. Continue. Zio monitor as above.   5. HTN: Does not check BP at home. Will not increase his metoprolol at this time given orthostatic hypotension noted today in the office. As his volume status improves consider titration of antihypertensive.   6. HLD: Liptior  Disposition: F/u with Dr. Kirke CorinArida or myslef in 4-6 weeks.   Current medicines are reviewed at length with the patient today.  The patient did not have any concerns regarding medicines.  Elinor DodgeSigned, Riaan Toledo PA-C 05/20/2016 2:59 PM     [facility] [place] [address] [place], [place] [address] [phone]

## 2016-05-20 NOTE — Patient Instructions (Signed)
Medication Instructions:  Continue current medications.   Labwork: none  Testing/Procedures: Your physician has requested that you have an echocardiogram. Echocardiography is a painless test that uses sound waves to create images of your heart. It provides your doctor with information about the size and shape of your heart and how well your heart's chambers and valves are working. This procedure takes approximately one hour. There are no restrictions for this procedure.  Your physician has requested that you have a carotid duplex. This test is an ultrasound of the carotid arteries in your neck. It looks at blood flow through these arteries that supply the brain with blood. Allow one hour for this exam. There are no restrictions or special instructions.  Your physician has recommended that you wear an event monitor. Event monitors are medical devices that record the heart's electrical activity. Doctors most often us these monitors to diagnose arrhythmias. Arrhythmias are problems with the speed or rhythm of the heartbeat. The monitor is a small, portable device. You can wear one while you do your normal daily activities. This is usually used to diagnose what is causing palpitations/syncope (passing out). ZIO PATCH FOR 14 DAYS.   Follow-Up: Your physician recommends that you schedule a follow-up appointment in: 4-5 WEEKS WITH DR ARIDA OR Eula ListenYAN DUNN, PA.   Any Other Special Instructions Will Be Listed Below (If Applicable). Echocardiogram An echocardiogram, or echocardiography, uses sound waves (ultrasound) to produce an image of your heart. The echocardiogram is simple, painless, obtained within a short period of time, and offers valuable information to your health care provider. The images from an echocardiogram can provide information such as:  Evidence of coronary artery disease (CAD).  Heart size.  Heart muscle function.  Heart valve function.  Aneurysm detection.  Evidence of a  past heart attack.  Fluid buildup around the heart.  Heart muscle thickening.  Assess heart valve function. Tell a health care provider about:  Any allergies you have.  All medicines you are taking, including vitamins, herbs, eye drops, creams, and over-the-counter medicines.  Any problems you or family members have had with anesthetic medicines.  Any blood disorders you have.  Any surgeries you have had.  Any medical conditions you have.  Whether you are pregnant or may be pregnant. What happens before the procedure? No special preparation is needed. Eat and drink normally. What happens during the procedure?  In order to produce an image of your heart, gel will be applied to your chest and a wand-like tool (transducer) will be moved over your chest. The gel will help transmit the sound waves from the transducer. The sound waves will harmlessly bounce off your heart to allow the heart images to be captured in real-time motion. These images will then be recorded.  You may need an IV to receive a medicine that improves the quality of the pictures. What happens after the procedure? You may return to your normal schedule including diet, activities, and medicines, unless your health care provider tells you otherwise. This information is not intended to replace advice given to you by your health care provider. Make sure you discuss any questions you have with your health care provider. Document Released: 06/04/2000 Document Revised: 01/24/2016 Document Reviewed: 02/12/2013 Elsevier Interactive Patient Education  2017 [employer]   If you need a refill on your cardiac medications before your next appointment, please call your pharmacy.

## 2016-05-24 ENCOUNTER — Other Ambulatory Visit: Payer: Self-pay | Admitting: Physician Assistant

## 2016-05-24 DIAGNOSIS — I739 Peripheral vascular disease, unspecified: Secondary | ICD-10-CM

## 2016-05-24 DIAGNOSIS — I2510 Atherosclerotic heart disease of native coronary artery without angina pectoris: Secondary | ICD-10-CM

## 2016-05-24 DIAGNOSIS — R42 Dizziness and giddiness: Secondary | ICD-10-CM

## 2016-05-24 DIAGNOSIS — I779 Disorder of arteries and arterioles, unspecified: Secondary | ICD-10-CM

## 2016-06-01 ENCOUNTER — Ambulatory Visit: Payer: Medicare HMO

## 2016-06-01 ENCOUNTER — Ambulatory Visit (INDEPENDENT_AMBULATORY_CARE_PROVIDER_SITE_OTHER): Payer: Medicare HMO

## 2016-06-01 ENCOUNTER — Other Ambulatory Visit: Payer: Self-pay

## 2016-06-01 DIAGNOSIS — R42 Dizziness and giddiness: Secondary | ICD-10-CM

## 2016-06-01 DIAGNOSIS — I6523 Occlusion and stenosis of bilateral carotid arteries: Secondary | ICD-10-CM | POA: Diagnosis not present

## 2016-06-01 DIAGNOSIS — I2510 Atherosclerotic heart disease of native coronary artery without angina pectoris: Secondary | ICD-10-CM

## 2016-06-01 DIAGNOSIS — I739 Peripheral vascular disease, unspecified: Secondary | ICD-10-CM

## 2016-06-01 DIAGNOSIS — I779 Disorder of arteries and arterioles, unspecified: Secondary | ICD-10-CM

## 2016-06-02 ENCOUNTER — Other Ambulatory Visit: Payer: Self-pay

## 2016-06-02 DIAGNOSIS — I6523 Occlusion and stenosis of bilateral carotid arteries: Secondary | ICD-10-CM

## 2016-06-03 ENCOUNTER — Other Ambulatory Visit (INDEPENDENT_AMBULATORY_CARE_PROVIDER_SITE_OTHER): Payer: Medicare HMO

## 2016-06-03 ENCOUNTER — Other Ambulatory Visit: Payer: Self-pay

## 2016-06-03 DIAGNOSIS — I10 Essential (primary) hypertension: Secondary | ICD-10-CM

## 2016-06-04 ENCOUNTER — Other Ambulatory Visit: Payer: Medicare HMO

## 2016-06-04 LAB — BASIC METABOLIC PANEL
BUN/Creatinine Ratio: 16 (ref 10–24)
BUN: 16 mg/dL (ref 8–27)
CALCIUM: 8.9 mg/dL (ref 8.6–10.2)
CO2: 17 mmol/L — AB (ref 18–29)
CREATININE: 1.02 mg/dL (ref 0.76–1.27)
Chloride: 102 mmol/L (ref 96–106)
GFR calc Af Amer: 79 mL/min/{1.73_m2} (ref >59)
GFR calc non Af Amer: 68 mL/min/{1.73_m2} (ref >59)
GLUCOSE: 220 mg/dL — AB (ref 65–99)
Potassium: 5 mmol/L (ref 3.5–5.2)
SODIUM: 144 mmol/L (ref 134–144)

## 2016-06-11 ENCOUNTER — Encounter (HOSPITAL_COMMUNITY): Payer: Medicare HMO

## 2016-06-15 ENCOUNTER — Other Ambulatory Visit: Payer: Self-pay

## 2016-06-15 MED ORDER — METOPROLOL TARTRATE 50 MG PO TABS: 50.0000 mg | ORAL_TABLET | Freq: Two times a day (BID) | ORAL | 3 refills | Status: DC

## 2016-06-17 ENCOUNTER — Encounter: Payer: Self-pay | Admitting: Physician Assistant

## 2016-06-17 ENCOUNTER — Ambulatory Visit (INDEPENDENT_AMBULATORY_CARE_PROVIDER_SITE_OTHER): Payer: Medicare HMO | Admitting: Physician Assistant

## 2016-06-17 VITALS — BP 132/62 | HR 78 | Ht 63.0 in | Wt 145.0 lb

## 2016-06-17 DIAGNOSIS — E782 Mixed hyperlipidemia: Secondary | ICD-10-CM

## 2016-06-17 DIAGNOSIS — I779 Disorder of arteries and arterioles, unspecified: Secondary | ICD-10-CM | POA: Diagnosis not present

## 2016-06-17 DIAGNOSIS — R42 Dizziness and giddiness: Secondary | ICD-10-CM | POA: Diagnosis not present

## 2016-06-17 DIAGNOSIS — I480 Paroxysmal atrial fibrillation: Secondary | ICD-10-CM

## 2016-06-17 DIAGNOSIS — I10 Essential (primary) hypertension: Secondary | ICD-10-CM | POA: Diagnosis not present

## 2016-06-17 DIAGNOSIS — I739 Peripheral vascular disease, unspecified: Secondary | ICD-10-CM

## 2016-06-17 DIAGNOSIS — I2510 Atherosclerotic heart disease of native coronary artery without angina pectoris: Secondary | ICD-10-CM | POA: Diagnosis not present

## 2016-06-17 NOTE — Progress Notes (Signed)
Cardiology Office Note Date:  06/17/2016  Patient ID:  Gary NeighboursSamuel J Mergen, DOB 11-Jan-1931, MRN [id number] PCP:  Sherrie MustacheFayegh Jadali  Cardiologist:  Dr. Kirke CorinArida, MD    Chief Complaint: Follow up dizziness/heart monitor   History of Present Illness: Lequita HaltSamuel J Clemson is a 82 y.o. male with history of CAD s/p prior stenting of the RCA years ago, [facility], asymptomatic PVCs, dizziness with activities, HTN, HLD, DM2, and PAD with asymptomatic carotid artery disease who presents for follow up of his dizziness/Zio patch results.     Patient's most recent cardiac cath in 07/2011 showed an occluded RCA with left to right collaterals, high-grade complex disease in OM1 which was known to be chronic, and had a normal EF. He was medically managed. Most recent nuclear stress test in 2016 showed moderate to large inferior septal and inferolateral fixed defect with mild reversibility. EF was normal. It was not much different from study in 2014. At his last follow up with Dr. Kirke CorinArida on 01/15/2016 he continued to note his history of dizziness with activities without any syncope, chest pain, or SOB. Given his history of carotid artery disease (no prior carotid doppler on file for review) with diminished left radial pulse he was scheduled for carotid doppler to evaluate his carotid arteries as well as his vertebral flow. He cancelled this test and did not reschedule. He contacted his PCP regarding his ongoing dizziness who advised the patient needed to be seen. He was seen on 11/30 for dizziness that he stated began sometime in the summer and was initially episodic, though only with ambulation. At his visit in late November, he noted dizziness every time he is up moving around on his feet that resolved with rest. Some positional change dizziness as well. Never with chest pain, SOB, palpitations, nausea, or vomiting. He did report a single episode of possible syncope in the spring  of 2017 while walking in his kitchen, though he was also not certain if he just fell.   Zio patch monitoring showed a predominant rhyhtm of sinus with intermittent episodes of Afib/flutter with the longest episode being 6 hours and 51 minutes with an average heart rate of 114 bpm. Occasional PACs and PVCs. Echo on 06/01/16 showed EF 55-60%, AK of the basal-midinferolateral and inferior myocardium (c/w prior study), GR1DD, aortic sclerosis without stenosis, mild MR, PASP 40 mmHg. Carotid doppler on 12/12 showed 40-59% RICA stenosis (high end of range), 1-39% LICA stenosis (high end of range), patent vertebral arteries with antegrade flow, and normal subclavian arteries bilaterally. 1 year follow up advised. Given patient had been on DAPT with ASA and Plavix case was discussed with Dr. Kirke CorinArida regarding medication management prior to starting DOAC with recommendation to start DOAC and stop DAPT. This was discussed with the patient who wanted to wait until his office visit today to discuss further.   He has not had any further dizziness since he was last seen. Never felt palpitations while wearing Zio monitor, though did have dizziness while wearing this. Has made dosage increase to his metoprolol to 50 mg bid already and is tolerating this medication. Has not been taking ASA, only Plavix.    Past Medical History:  Diagnosis Date  . Carotid artery stenosis    a. no studies to review in epic  . Coronary artery disease    Cardiac cath in February of 2013: EF 60%, high-grade chronic disease in OM 1, occluded mid RCA at the site  of a previously placed stent with left-to-right collaterals, occluded left SFA.  . Diabetes mellitus with complication (HCC)   . Hypercholesterolemia   . Hypertension     Past Surgical History:  Procedure Laterality Date  . BACK SURGERY     years ago  . CARDIAC CATHETERIZATION  07/2007   [facility]  . CARDIAC CATHETERIZATION  07/2011   [facility]  . CORONARY ANGIOPLASTY WITH STENT  PLACEMENT      Current Outpatient Prescriptions  Medication Sig Dispense Refill  . atorvastatin (LIPITOR) 40 MG tablet Take 40 mg by mouth daily.    . finasteride (PROSCAR) 5 MG tablet Take 1 tablet (5 mg total) by mouth daily. 90 tablet 4  . fish oil-omega-3 fatty acids 1000 MG capsule Take 2 g by mouth daily.    Marland Kitchen glipiZIDE (GLUCOTROL XL) 5 MG 24 hr tablet     . IRON PO Take by mouth.    . lansoprazole (PREVACID) 15 MG capsule Take 15 mg by mouth daily.    Marland Kitchen linagliptin (TRADJENTA) 5 MG TABS tablet Take 5 mg by mouth daily.    . metFORMIN (GLUCOPHAGE) 1000 MG tablet Take 1,000 mg by mouth 2 (two) times daily with a meal.    . metoprolol tartrate (LOPRESSOR) 50 MG tablet Take 1 tablet (50 mg total) by mouth 2 (two) times daily. 60 tablet 3  . ONETOUCH VERIO test strip     . saxagliptin HCl (ONGLYZA) 5 MG TABS tablet Take 5 mg by mouth daily.    . tamsulosin (FLOMAX) 0.4 MG CAPS capsule Take 1 capsule (0.4 mg total) by mouth daily. 90 capsule 4   No current facility-administered medications for this visit.     Allergies:   Patient has no known allergies.   Social History:  The patient  reports that he has been smoking Cigarettes.  He has a 25.00 pack-year smoking history. He has never used smokeless tobacco. He reports that he does not drink alcohol or use drugs.   Family History:  The patient's family history includes Heart attack in his father.  ROS:   Review of Systems  Constitutional: Positive for malaise/fatigue. Negative for chills, diaphoresis, fever and weight loss.  HENT: Negative for congestion.   Eyes: Negative for discharge and redness.  Respiratory: Negative for cough, hemoptysis, sputum production, shortness of breath and wheezing.   Cardiovascular: Negative for chest pain, palpitations, orthopnea, claudication, leg swelling and PND.  Gastrointestinal: Negative for abdominal pain, blood in stool, heartburn, melena, nausea and vomiting.  Genitourinary: Negative for  hematuria.  Musculoskeletal: Negative for falls and myalgias.  Skin: Negative for rash.  Neurological: Negative for dizziness, tingling, tremors, sensory change, speech change, focal weakness, loss of consciousness and weakness.  Endo/Heme/Allergies: Bruises/bleeds easily.  Psychiatric/Behavioral: Negative for substance abuse. The patient is not nervous/anxious.   All other systems reviewed and are negative.    PHYSICAL EXAM:  VS:  BP 132/62 (BP Location: Left Arm, Patient Position: Sitting, Cuff Size: Normal)   Pulse 78   Ht 5\' 3"  (1.6 m)   Wt 145 lb (65.8 kg)   BMI 25.69 kg/m  BMI: Body mass index is 25.69 kg/m.  Physical Exam  Constitutional: He is oriented to person, place, and time. He appears well-developed and well-nourished.  HENT:  Head: Normocephalic and atraumatic.  Eyes: Right eye exhibits no discharge. Left eye exhibits no discharge.  Neck: Normal range of motion. No JVD present.  Cardiovascular: Normal rate, regular rhythm, S1 normal, S2 normal and normal heart  sounds.  Exam reveals no distant heart sounds, no friction rub, no midsystolic click and no opening snap.   No murmur heard. Pulmonary/Chest: Effort normal and breath sounds normal. No respiratory distress. He has no decreased breath sounds. He has no wheezes. He has no rales. He exhibits no tenderness.  Abdominal: Soft. He exhibits no distension. There is no tenderness.  Musculoskeletal: He exhibits no edema.  Neurological: He is alert and oriented to person, place, and time.  Skin: Skin is warm and dry. No cyanosis. Nails show no clubbing.  Psychiatric: He has a normal mood and affect. His speech is normal and behavior is normal. Judgment and thought content normal.     EKG:  Was ordered and interpreted by me today. Shows NSR, 78 bpm, 1st degree AV block, RBBB, left anterior fascicular block (bifascicular block), LVH, poor R wave progression, no acute st/t changes  Recent Labs: 06/03/2016: BUN 16;  Creatinine, Ser 1.02; Potassium 5.0; Sodium 144  No results found for requested labs within last 8760 hours.   Estimated Creatinine Clearance: 44.9 mL/min (by C-G formula based on SCr of 1.02 mg/dL).   Wt Readings from Last 3 Encounters:  06/17/16 145 lb (65.8 kg)  05/20/16 145 lb 8 oz (66 kg)  01/15/16 139 lb 14.4 oz (63.5 kg)     Other studies reviewed: Additional studies/records reviewed today include: summarized above  ASSESSMENT AND PLAN:  1. Newly diagnosed PAF: Currently in sinus rhythm with heart rates in the 70's bpm. Continue increased dose of Lopressor of 50 mg bid. Has not been taking ASA, continue to hold this medication. Stop Plavix. Check bmet, magnesium, tsh, and cbc. Based on these results will plan to start Eliquis or Xarelto on 12/29 as he is near the cut off for both medications reduced dose. CHADS2VASC at least 6 (CHF, HTN, age x 2, DM, vascular disease). Risks of anticoagulation and stroke discussed in detail.   2. Dizziness: Resolved. Likely related to the above.   3. Carotid artery disease: Recent carotid ultrasound showing 40-59% stenosis of RICA and 1-39% stenosis of LICA. Repeat study in 1 year.   4. CAD as above: No symptoms concerning for angina. No recent PCI. ASA and Plavix will be replaced with DOAC as above. Discussed with Dr. Kirke CorinArida.   5. HTN: BP well controlled. Continue current medications.   6. HLD: Lipitor.  Disposition: F/u with me in 1 month.   Current medicines are reviewed at length with the patient today.  The patient did not have any concerns regarding medicines.  Elinor DodgeSigned, Krysten Veronica PA-C 06/17/2016 3:08 PM     [facility] [place] [address] [place], [place] [address] [phone]

## 2016-06-17 NOTE — Patient Instructions (Addendum)
Medication Instructions:  Your physician has recommended you make the following change in your medication:  1- STOP taking your Aspirin. 2- STOP taking your Plavix.   Labwork: Your physician recommends that you return for lab work in: TODAY. Once we have the results of the lab work, someone will call you to let you know what medication you will need to start on for anticoagulation.   Follow-Up: Your physician recommends that you schedule a follow-up appointment in: 1 MONTH WITH DR ARIDA OR Eula ListenYAN DUNN, PA.   If you need a refill on your cardiac medications before your next appointment, please call your pharmacy.

## 2016-06-18 ENCOUNTER — Telehealth: Payer: Self-pay | Admitting: *Deleted

## 2016-06-18 DIAGNOSIS — Z5181 Encounter for therapeutic drug level monitoring: Secondary | ICD-10-CM

## 2016-06-18 DIAGNOSIS — R790 Abnormal level of blood mineral: Secondary | ICD-10-CM

## 2016-06-18 DIAGNOSIS — Z7901 Long term (current) use of anticoagulants: Secondary | ICD-10-CM

## 2016-06-18 DIAGNOSIS — I10 Essential (primary) hypertension: Secondary | ICD-10-CM

## 2016-06-18 DIAGNOSIS — Z79899 Other long term (current) drug therapy: Secondary | ICD-10-CM

## 2016-06-18 LAB — CBC WITH DIFFERENTIAL/PLATELET
BASOS: 1 %
Basophils Absolute: 0 10*3/uL (ref 0.0–0.2)
EOS (ABSOLUTE): 0.2 10*3/uL (ref 0.0–0.4)
EOS: 3 %
HEMATOCRIT: 35.3 % — AB (ref 37.5–51.0)
Hemoglobin: 11.8 g/dL — ABNORMAL LOW (ref 13.0–17.7)
IMMATURE GRANS (ABS): 0.1 10*3/uL (ref 0.0–0.1)
IMMATURE GRANULOCYTES: 1 %
LYMPHS: 21 %
Lymphocytes Absolute: 1.6 10*3/uL (ref 0.7–3.1)
MCH: 28.7 pg (ref 26.6–33.0)
MCHC: 33.4 g/dL (ref 31.5–35.7)
MCV: 86 fL (ref 79–97)
MONOCYTES: 12 %
Monocytes Absolute: 1 10*3/uL — ABNORMAL HIGH (ref 0.1–0.9)
NEUTROS PCT: 62 %
Neutrophils Absolute: 5 10*3/uL (ref 1.4–7.0)
Platelets: 210 10*3/uL (ref 150–379)
RBC: 4.11 x10E6/uL — ABNORMAL LOW (ref 4.14–5.80)
RDW: 15 % (ref 12.3–15.4)
WBC: 7.9 10*3/uL (ref 3.4–10.8)

## 2016-06-18 LAB — BASIC METABOLIC PANEL
BUN/Creatinine Ratio: 15 (ref 10–24)
BUN: 15 mg/dL (ref 8–27)
CO2: 25 mmol/L (ref 18–29)
CREATININE: 0.98 mg/dL (ref 0.76–1.27)
Calcium: 9.2 mg/dL (ref 8.6–10.2)
Chloride: 93 mmol/L — ABNORMAL LOW (ref 96–106)
GFR calc Af Amer: 83 mL/min/{1.73_m2} (ref >59)
GFR, EST NON AFRICAN AMERICAN: 72 mL/min/{1.73_m2} (ref >59)
Glucose: 103 mg/dL — ABNORMAL HIGH (ref 65–99)
Potassium: 4.1 mmol/L (ref 3.5–5.2)
Sodium: 135 mmol/L (ref 134–144)

## 2016-06-18 LAB — MAGNESIUM: MAGNESIUM: 1.3 mg/dL — AB (ref 1.6–2.3)

## 2016-06-18 LAB — TSH: TSH: 2.79 u[IU]/mL (ref 0.450–4.500)

## 2016-06-18 MED ORDER — RIVAROXABAN 20 MG PO TABS: 20.0000 mg | ORAL_TABLET | Freq: Every day | ORAL | 3 refills | Status: DC

## 2016-06-18 MED ORDER — MAGNESIUM OXIDE 400 MG PO TABS: 400.0000 mg | ORAL_TABLET | Freq: Every day | ORAL | 3 refills | Status: DC

## 2016-06-18 NOTE — Telephone Encounter (Signed)
Results and instructions called to pt. Patient wrote down each instruction and read back successfully. Patient will pick up new medications this morning from pharmacy. [facility] will [facility]/16 for repeat labwork.  Patient verbalized understanding.

## 2016-06-18 NOTE — Telephone Encounter (Signed)
-----   Message from Sondra Bargesyan M Dunn, PA-C sent at 06/18/2016  7:02 AM EST ----- Please call the patient. Renal function and potassium ok. HGB slightly low, though mostly stable. Magnesium low at 1.3.  1) Start magnesium oxide 400 mg daily 2) For anticoagulation given his newly diagnosed Afib: start Xarelto 20 mg q dinner (CrCl 54.06 mL/min based on weight and bmet from 12/28).  3) Follow up bmet, cbc, and magnesium in 2 weeks.

## 2016-07-05 ENCOUNTER — Other Ambulatory Visit
Admission: RE | Admit: 2016-07-05 | Discharge: 2016-07-05 | Disposition: A | Discharge status: Home / Self Care | Payer: Medicare HMO | Source: Ambulatory Visit | Attending: Physician Assistant | Admitting: Physician Assistant

## 2016-07-05 ENCOUNTER — Telehealth: Payer: Self-pay | Admitting: Physician Assistant

## 2016-07-05 ENCOUNTER — Telehealth: Payer: Self-pay | Admitting: *Deleted

## 2016-07-05 ENCOUNTER — Other Ambulatory Visit: Payer: Self-pay

## 2016-07-05 DIAGNOSIS — Z79899 Other long term (current) drug therapy: Secondary | ICD-10-CM | POA: Insufficient documentation

## 2016-07-05 DIAGNOSIS — I10 Essential (primary) hypertension: Secondary | ICD-10-CM

## 2016-07-05 LAB — BASIC METABOLIC PANEL
Anion gap: 8 (ref 5–15)
BUN: 17 mg/dL (ref 6–20)
CO2: 24 mmol/L (ref 22–32)
CREATININE: 1.18 mg/dL (ref 0.61–1.24)
Calcium: 8.7 mg/dL — ABNORMAL LOW (ref 8.9–10.3)
Chloride: 100 mmol/L — ABNORMAL LOW (ref 101–111)
GFR calc Af Amer: >60 mL/min (ref >60)
GFR, EST NON AFRICAN AMERICAN: 55 mL/min — AB (ref >60)
Glucose, Bld: 228 mg/dL — ABNORMAL HIGH (ref 65–99)
Potassium: 3.7 mmol/L (ref 3.5–5.1)
SODIUM: 132 mmol/L — AB (ref 135–145)

## 2016-07-05 LAB — CBC WITH DIFFERENTIAL/PLATELET
Basophils Absolute: 0.1 10*3/uL (ref 0–0.1)
Basophils Relative: 1 %
EOS ABS: 0.1 10*3/uL (ref 0–0.7)
EOS PCT: 3 %
HCT: 36.2 % — ABNORMAL LOW (ref 40.0–52.0)
Hemoglobin: 12.5 g/dL — ABNORMAL LOW (ref 13.0–18.0)
Lymphocytes Relative: 26 %
Lymphs Abs: 1.5 10*3/uL (ref 1.0–3.6)
MCH: 29.9 pg (ref 26.0–34.0)
MCHC: 34.5 g/dL (ref 32.0–36.0)
MCV: 86.6 fL (ref 80.0–100.0)
MONOS PCT: 13 %
Monocytes Absolute: 0.7 10*3/uL (ref 0.2–1.0)
Neutro Abs: 3.3 10*3/uL (ref 1.4–6.5)
Neutrophils Relative %: 57 %
PLATELETS: 158 10*3/uL (ref 150–440)
RBC: 4.18 MIL/uL — AB (ref 4.40–5.90)
RDW: 15 % — ABNORMAL HIGH (ref 11.5–14.5)
WBC: 5.7 10*3/uL (ref 3.8–10.6)

## 2016-07-05 LAB — MAGNESIUM: Magnesium: 1.5 mg/dL — ABNORMAL LOW (ref 1.7–2.4)

## 2016-07-05 MED ORDER — RIVAROXABAN 15 MG PO TABS: 15.0000 mg | ORAL_TABLET | Freq: Every day | ORAL | 3 refills | Status: DC

## 2016-07-05 NOTE — Telephone Encounter (Signed)
Patient was supposed to get lab work on 07/02/16. Patient has not had labs drawn yet.  S/w patient. His [facility] is in the [facility] for the past few days and has been with her frequently. I asked if [facility] could [facility] point today or tomorrow. He said he was not sure if he could.  Advised we need to check his kidney functions and it would be great if he could go today or tomorrow at anytime. He verbalized understanding and will try to go. I expressed sympathy for his wife being in the hospital.  Lab orders re-entered as some were cancelled in system.

## 2016-07-05 NOTE — Telephone Encounter (Signed)
Samples of Xarelto 15mg  were given to the patient, quantity 3 bottles, Lot Number [id number] exp: 4/19 Along with a 30-day free card. At front desk for pickup. Pt aware

## 2016-07-20 ENCOUNTER — Encounter: Payer: Self-pay | Admitting: Physician Assistant

## 2016-07-20 ENCOUNTER — Ambulatory Visit (INDEPENDENT_AMBULATORY_CARE_PROVIDER_SITE_OTHER): Payer: Medicare HMO | Admitting: Physician Assistant

## 2016-07-20 VITALS — BP 182/88 | HR 67 | Ht 63.0 in | Wt 146.8 lb

## 2016-07-20 DIAGNOSIS — E782 Mixed hyperlipidemia: Secondary | ICD-10-CM

## 2016-07-20 DIAGNOSIS — I2510 Atherosclerotic heart disease of native coronary artery without angina pectoris: Secondary | ICD-10-CM | POA: Diagnosis not present

## 2016-07-20 DIAGNOSIS — I5032 Chronic diastolic (congestive) heart failure: Secondary | ICD-10-CM | POA: Diagnosis not present

## 2016-07-20 DIAGNOSIS — I10 Essential (primary) hypertension: Secondary | ICD-10-CM

## 2016-07-20 DIAGNOSIS — Z79899 Other long term (current) drug therapy: Secondary | ICD-10-CM

## 2016-07-20 DIAGNOSIS — I480 Paroxysmal atrial fibrillation: Secondary | ICD-10-CM

## 2016-07-20 DIAGNOSIS — K219 Gastro-esophageal reflux disease without esophagitis: Secondary | ICD-10-CM

## 2016-07-20 DIAGNOSIS — I779 Disorder of arteries and arterioles, unspecified: Secondary | ICD-10-CM

## 2016-07-20 DIAGNOSIS — I739 Peripheral vascular disease, unspecified: Secondary | ICD-10-CM

## 2016-07-20 MED ORDER — RANITIDINE HCL 150 MG PO TABS: 150.0000 mg | ORAL_TABLET | Freq: Every day | ORAL | 3 refills | Status: DC

## 2016-07-20 NOTE — Patient Instructions (Addendum)
Medication Instructions:  STOP taking prevacid START taking zantac 150mg  once daily  Labwork: BMET and mag today  Testing/Procedures: none  Follow-Up: Your physician recommends that you schedule a follow-up appointment in: one month with Dr. Kirke CorinArida or Eula Listenyan Dunn, PA-C   Any Other Special Instructions Will Be Listed Below (If Applicable). You may take flomax at night and all other once daily medications in the morning. Take all other medications at the prescribed time of day.       If you need a refill on your cardiac medications before your next appointment, please call your pharmacy.

## 2016-07-20 NOTE — Progress Notes (Signed)
Cardiology Office Note Date:  07/20/2016  Patient ID:  Rob, Mciver 06/05/1931, MRN [id number] PCP:  Sherrie Mustache  Cardiologist:  Dr. Kirke Corin, MD    Chief Complaint: Follow up Afib  History of Present Illness: Gary Lynn is a 83 y.o. male with history of CAD s/p prior stenting of the RCA years ago, recently diagnosed PAF/flutter by [facility] patch monitoring in 05/2016, chronic diastolic CHF, asymptomatic PVCs, dizziness with activities, HTN, HLD, DM2, and PAD with asymptomatic carotid artery disease who presents for follow up of his recently diagnosed Afib.     Patient's most recent cardiac cath in 07/2011 showed an occluded RCA with left to right collaterals, high-grade complex disease in OM1 which was known to be chronic, and had a normal EF. He was medically managed. Most recent nuclear stress test in 2016 showed moderate to large inferior septal and inferolateral fixed defect with mild reversibility. EF was normal. It was not much different from study in 2014. At his follow up with Dr. Kirke Corin on 01/15/2016 he continued to note his history of dizziness with activities without any syncope, chest pain, or SOB. Given his history of carotid artery disease (no prior carotid doppler on file for review) with diminished left radial pulse he was scheduled for carotid doppler to evaluate his carotid arteries as well as his vertebral flow. He cancelled this test and did not reschedule. He contacted his PCP regarding his ongoing dizziness who advised the patient needed to be seen. He was seen on 11/30 for dizziness that he stated began sometime in the summer and was initially episodic, though only with ambulation. At his visit in late November, he noted dizziness with ambulation that resolved with rest. Some positional change dizziness as well. Never with chest pain, SOB, palpitations, nausea, or vomiting. He did report a single episode of possible syncope in the spring of 2017 while walking in his  kitchen, though he was also not certain if he just fell. He underwent Zio patch monitoring that showed a predominant rhyhtm of sinus with intermittent episodes of Afib/flutter with the longest episode being 6 hours and 51 minutes with an average heart rate of 114 bpm. Occasional PACs and PVCs. Echo on 06/01/16 showed EF 55-60%, AK of the basal-midinferolateral and inferior myocardium (c/w prior study), GR1DD, aortic sclerosis without stenosis, mild MR, PASP 40 mmHg. Carotid doppler on 12/12 showed 40-59% RICA stenosis (high end of range), 1-39% LICA stenosis (high end of range), patent vertebral arteries with antegrade flow, and normal subclavian arteries bilaterally. 1 year follow up advised. Given patient had been on DAPT with ASA and Plavix case was discussed with Dr. Kirke Corin regarding medication management prior to starting DOAC with recommendation to start DOAC and stop DAPT. In follow up on 12/28 he had not had any further dizziness. He did not feel any palpitations associated with his Afib/flutter. He was noted to have had some dizziness while wearing the monitor. His Lopressor was increased to 50 mg bid for rate control. Labs were checked on 12/28 that showed a stable cbc with hgb 11.8, [place] 0.98, K+ 4.1, magnesium 1.3, tsh normal. He was started on Xarelto 20 mg. Given his CrCl calcualted out to be 54.06 mL/min with these labs he followed up on 07/05/16 to recheck renal function and to assess if a dosage change needed to be made based on CrCl. On 1/15 SCr had trended upwards to 1.18, K+ 3.7, Magnesium 1.5, hgb 12.5. CrCl calcualted out to be 44 mL/min.  Thus, he was changed to reduced-dose Xarelto 15 mg.   He comes in doing well today. No complaints. His wife was admitted to [facility] with SBO. He is worried about her and this is weighing on his mind. Did not take morning medications this morning. Some confusion regarding what time of day to take medications. No chest pain, SOB, palpitations, nausea, vomiting,  presyncope, or syncope. Occasionally notes mild dizziness if he stands too fast. This resolves after a couple of seconds. Tolerating medications. Wonders if he can stop Prevacid.    Past Medical History:  Diagnosis Date  . Carotid artery stenosis    a. carotid doppler 05/2016: RICA 40-59%, LICA 1-39%  . Chronic diastolic CHF (congestive heart failure) (HCC)    a. echo 05/2016: EF 55-60%, AK of the basal-midinferolateral and inferior myocardium (c/w prior study), GR1DD, aortic sclerosis without stenosis, mild MR, PASP 40 mmHg  . Coronary artery disease    Cardiac cath in February of 2013: EF 60%, high-grade chronic disease in OM 1, occluded mid RCA at the site of a previously placed stent with left-to-right collaterals, occluded left SFA.  . Diabetes mellitus with complication (HCC)   . Hypercholesterolemia   . Hypertension   . PAF (paroxysmal atrial fibrillation) (HCC)    a. Zio monitor 05/2016: predominant rhyhtm of sinus with intermittent episodes of Afib/flutter with the longest episode being 6 hours and 51 minutes with an average heart rate of 114 bpm. Occasional PACs and PVCs. b. CHADS2VASc --> 6 (CHF, HTN, age x 2, DM, vascular disease)    Past Surgical History:  Procedure Laterality Date  . BACK SURGERY     years ago  . CARDIAC CATHETERIZATION  07/2007   [facility]  . CARDIAC CATHETERIZATION  07/2011   [facility]  . CORONARY ANGIOPLASTY WITH STENT PLACEMENT      Current Outpatient Prescriptions  Medication Sig Dispense Refill  . atorvastatin (LIPITOR) 40 MG tablet Take 40 mg by mouth daily.    . finasteride (PROSCAR) 5 MG tablet Take 1 tablet (5 mg total) by mouth daily. 90 tablet 4  . fish oil-omega-3 fatty acids 1000 MG capsule Take 2 g by mouth daily.    Marland Kitchen glipiZIDE (GLUCOTROL XL) 5 MG 24 hr tablet     . IRON PO Take by mouth.    . lansoprazole (PREVACID) 15 MG capsule Take 15 mg by mouth daily.    Marland Kitchen linagliptin (TRADJENTA) 5 MG TABS tablet Take 5 mg by mouth daily.    . magnesium  oxide (MAG-OX) 400 MG tablet Take 1 tablet (400 mg total) by mouth daily. 30 tablet 3  . metFORMIN (GLUCOPHAGE) 1000 MG tablet Take 1,000 mg by mouth 2 (two) times daily with a meal.    . metoprolol tartrate (LOPRESSOR) 50 MG tablet Take 1 tablet (50 mg total) by mouth 2 (two) times daily. 60 tablet 3  . ONETOUCH VERIO test strip     . Rivaroxaban (XARELTO) 15 MG TABS tablet Take 1 tablet (15 mg total) by mouth daily with supper. 30 tablet 3  . saxagliptin HCl (ONGLYZA) 5 MG TABS tablet Take 5 mg by mouth daily.    . tamsulosin (FLOMAX) 0.4 MG CAPS capsule Take 1 capsule (0.4 mg total) by mouth daily. 90 capsule 4   No current facility-administered medications for this visit.     Allergies:   Patient has no known allergies.   Social History:  The patient  reports that he has been smoking Cigarettes.  He has a  25.00 pack-year smoking history. He has never used smokeless tobacco. He reports that he does not drink alcohol or use drugs.   Family History:  The patient's family history includes Heart attack in his father.  ROS:   Review of Systems  Constitutional: Positive for malaise/fatigue. Negative for chills, diaphoresis, fever and weight loss.  HENT: Negative for congestion.   Eyes: Negative for discharge and redness.  Respiratory: Negative for cough, hemoptysis, sputum production, shortness of breath and wheezing.   Cardiovascular: Negative for chest pain, palpitations, orthopnea, claudication, leg swelling and PND.  Gastrointestinal: Positive for heartburn. Negative for abdominal pain, blood in stool, melena, nausea and vomiting.  Genitourinary: Negative for hematuria.  Musculoskeletal: Negative for falls and myalgias.  Skin: Negative for rash.  Neurological: Negative for dizziness, tingling, tremors, sensory change, speech change, focal weakness, loss of consciousness and weakness.  Endo/Heme/Allergies: Does not bruise/bleed easily.  Psychiatric/Behavioral: Negative for substance  abuse. The patient is not nervous/anxious.   All other systems reviewed and are negative.    PHYSICAL EXAM:  VS:  BP (!) 182/88 (BP Location: Left Arm, Patient Position: Sitting, Cuff Size: Normal)   Pulse 67   Ht 5\' 3"  (1.6 m)   Wt 146 lb 12 oz (66.6 kg)   BMI 26.00 kg/m  BMI: Body mass index is 26 kg/m.  Physical Exam  Constitutional: He is oriented to person, place, and time. He appears well-developed and well-nourished.  HENT:  Head: Normocephalic and atraumatic.  Eyes: Right eye exhibits no discharge. Left eye exhibits no discharge.  Neck: Normal range of motion. No JVD present.  Cardiovascular: Normal rate, regular rhythm, S1 normal, S2 normal and normal heart sounds.  Exam reveals no distant heart sounds, no friction rub, no midsystolic click and no opening snap.   No murmur heard. Pulmonary/Chest: Effort normal and breath sounds normal. No respiratory distress. He has no decreased breath sounds. He has no wheezes. He has no rales. He exhibits no tenderness.  Abdominal: Soft. He exhibits no distension. There is no tenderness.  Musculoskeletal: He exhibits edema.  Trace bilateral pre-tibial edema, left > right  Neurological: He is alert and oriented to person, place, and time.  Skin: Skin is warm and dry. No cyanosis. Nails show no clubbing.  Psychiatric: He has a normal mood and affect. His speech is normal and behavior is normal. Judgment and thought content normal.    EKG:  Was ordered and interpreted by me today. Shows NSR, 67 bpm, RBBB, 1st degree AV block, left anterior fascicular block, poor R wave progression, nonspecific st/t changes.   Recent Labs: 06/17/2016: TSH 2.790 07/05/2016: BUN 17; Creatinine, Ser 1.18; Hemoglobin 12.5; Magnesium 1.5; Platelets 158; Potassium 3.7; Sodium 132  No results found for requested labs within last 8760 hours.   Estimated Creatinine Clearance: 38.2 mL/min (by C-G formula based on SCr of 1.18 mg/dL).   Wt Readings from Last 3  Encounters:  07/20/16 146 lb 12 oz (66.6 kg)  06/17/16 145 lb (65.8 kg)  05/20/16 145 lb 8 oz (66 kg)     Other studies reviewed: Additional studies/records reviewed today include: summarized above  ASSESSMENT AND PLAN:  1. PAF: diagnosed in 05/2016. Currently in sinus rhythm with heart rate in the 60's bpm. Continue Lopressor 50 mg bid for rate control. Initially started on Xarelto 20 mg, though dose was decreased 2/2 follow up bmet showing CrCl of < 50 mL/min. Check cmet, cbc, and magnesium today. Continue reduced-dose Xarelto until labs are back. CHADS2VASc at  least 6 (CHF, HTN, age x 2, DM, vascular disease).  2. CAD: Currently, no symptoms suggestive of angina/ischemia. On Xarelto in place of ASA. No recent PCI. Continue Lipitor. No plans for further ischemic evaluation at this time.   3. Chronic diastolic CHF: He does not appear to be volume overloaded. No SOB. Will add compression hose given trace pre-tibial edema. May need Lasix in the future.   4. HTN: Did not take medications this AM prior to going to the hospital to see his wife. His wife's illness has been weighing on him. He will go back home at this time to take his medications prior to returning to the hospital.   5. HLD: Lipitor. Check lipid and liver at next office visit.   6. Carotid artery disease: Per HPI. Needs repeat study 05/2016.   7. Medication management: Listed on the AVS times to take each medication. Granddaughter will help with this. They decline HHRN at this time.   8. Hypomagnesemia: Check magnesium. Continue repletion via PO with magnesium oxide. Likely in the setting of prolonged PPI usage.   9. Reflux: Patient request change in Prevacid. Will place on Zantac 150 mg daily. Follow up with PCP.   Disposition: F/u with me in 4 weeks.   Current medicines are reviewed at length with the patient today.  The patient did not have any concerns regarding medicines.  Elinor DodgeSigned, Broghan Pannone PA-C 07/20/2016 1:01 PM      [facility] [place] [address] [place], [place] [address] [phone]

## 2016-07-21 LAB — BASIC METABOLIC PANEL
BUN / CREAT RATIO: 16 (ref 10–24)
BUN: 17 mg/dL (ref 8–27)
CHLORIDE: 92 mmol/L — AB (ref 96–106)
CO2: 25 mmol/L (ref 18–29)
Calcium: 9 mg/dL (ref 8.6–10.2)
Creatinine, Ser: 1.04 mg/dL (ref 0.76–1.27)
GFR calc non Af Amer: 66 mL/min/{1.73_m2} (ref >59)
GFR, EST AFRICAN AMERICAN: 76 mL/min/{1.73_m2} (ref >59)
Glucose: 187 mg/dL — ABNORMAL HIGH (ref 65–99)
POTASSIUM: 3.8 mmol/L (ref 3.5–5.2)
SODIUM: 134 mmol/L (ref 134–144)

## 2016-07-21 LAB — MAGNESIUM: Magnesium: 2 mg/dL (ref 1.6–2.3)

## 2016-08-18 ENCOUNTER — Encounter: Payer: Self-pay | Admitting: Physician Assistant

## 2016-08-18 ENCOUNTER — Ambulatory Visit (INDEPENDENT_AMBULATORY_CARE_PROVIDER_SITE_OTHER): Payer: Medicare HMO | Admitting: Physician Assistant

## 2016-08-18 VITALS — BP 172/72 | HR 70 | Ht 63.0 in | Wt 148.0 lb

## 2016-08-18 DIAGNOSIS — I2510 Atherosclerotic heart disease of native coronary artery without angina pectoris: Secondary | ICD-10-CM | POA: Diagnosis not present

## 2016-08-18 DIAGNOSIS — I951 Orthostatic hypotension: Secondary | ICD-10-CM

## 2016-08-18 DIAGNOSIS — I480 Paroxysmal atrial fibrillation: Secondary | ICD-10-CM

## 2016-08-18 DIAGNOSIS — E782 Mixed hyperlipidemia: Secondary | ICD-10-CM | POA: Diagnosis not present

## 2016-08-18 DIAGNOSIS — I872 Venous insufficiency (chronic) (peripheral): Secondary | ICD-10-CM

## 2016-08-18 DIAGNOSIS — I5032 Chronic diastolic (congestive) heart failure: Secondary | ICD-10-CM

## 2016-08-18 DIAGNOSIS — Z79899 Other long term (current) drug therapy: Secondary | ICD-10-CM

## 2016-08-18 DIAGNOSIS — I10 Essential (primary) hypertension: Secondary | ICD-10-CM | POA: Diagnosis not present

## 2016-08-18 MED ORDER — LISINOPRIL 5 MG PO TABS: 5.0000 mg | ORAL_TABLET | Freq: Every day | ORAL | 3 refills | Status: DC

## 2016-08-18 NOTE — Patient Instructions (Signed)
Medication Instructions:  Your physician has recommended you make the following change in your medication:  START taking lisinopril 5mg  once daily   Labwork: BMET today  Testing/Procedures: none  Follow-Up: Your physician recommends that you schedule a follow-up appointment in: 1 week with Eula Listenyan Dunn, PA-C or Dr. Kirke CorinArida.    Any Other Special Instructions Will Be Listed Below (If Applicable). Please increase your fluid (water) intake. You may wear compression stockings for lower extremity swelling.      If you need a refill on your cardiac medications before your next appointment, please call your pharmacy.

## 2016-08-18 NOTE — Progress Notes (Signed)
Cardiology Office Note Date:  08/18/2016  Patient ID:  Gary, Lynn 1930-08-20, MRN [id number] PCP:  Casilda Carls  Cardiologist:  Dr. Fletcher Anon, MD    Chief Complaint: Follow up Afib  History of Present Illness: Gary Lynn is a 83 y.o. male with history of CAD s/p prior stenting of the RCA years ago, recently diagnosed PAF/flutter by [facility] patch monitoring in 00/3491, chronic diastolic CHF, asymptomatic PVCs, dizziness with activities, HTN, HLD, DM2, and PAD with asymptomatic carotid artery disease who presents for follow up of his recently diagnosed Afib.     Patient's most recent cardiac cath in 07/2011 showed an occluded RCA with left to right collaterals, high-grade complex disease in OM1 which was known to be chronic, and had a normal EF. He was medically managed. Most recent nuclear stress test in 2016 showed moderate to large inferior septal and inferolateral fixed defect with mild reversibility. EF was normal. It was not much different from study in 2014. At his follow up with Dr. Fletcher Anon on 01/15/2016 he continued to note his history of dizziness with activities without any syncope, chest pain, or SOB. Given his history of carotid artery disease with diminished left radial pulse he was scheduled for carotid doppler. He cancelled this test and did not reschedule. He contacted his PCP regarding his ongoing dizziness who advised the patient needed to be seen. He was seen on 11/30 for dizziness that he stated began sometime in the summer and was initially episodic with ambulation. At his visit in late November, he noted dizziness with ambulation that resolved with rest. Some positional change dizziness as well. He did report a single episode of possible syncope in the spring of 2017 while walking in his kitchen, though he was also not certain if he just fell. He underwent Zio patch monitoring that showed a predominant rhyhtm of sinus with intermittent episodes of Afib/flutter with the longest  episode being 6 hours and 51 minutes with an average heart rate of 114 bpm. Occasional PACs and PVCs. Echo on 06/01/16 showed EF 55-60%, AK of the basal-midinferolateral and inferior myocardium (c/w prior study), GR1DD, aortic sclerosis without stenosis, mild MR, PASP 40 mmHg. Carotid doppler on 12/12 showed 40-59% RICA stenosis (high end of range), 7-91% LICA stenosis (high end of range), patent vertebral arteries with antegrade flow, and normal subclavian arteries bilaterally. Given patient had been on DAPT with ASA and Plavix case was discussed with Dr. Fletcher Anon regarding medication management prior to starting DOAC with recommendation to start DOAC and stop DAPT. In follow up on 12/28 he had not had any further dizziness. He did not feel any palpitations associated with his Afib/flutter. He was noted to have had some dizziness while wearing the monitor. His Lopressor was increased to 50 mg bid for rate control. Labs were checked on 12/28 that showed a stable cbc with hgb 11.8, Falman 0.98, K+ 4.1, magnesium 1.3, tsh normal. He was started on Xarelto 20 mg. Given his CrCl calcualted out to be 54.06 mL/min with these labs he followed up on 07/05/16 to recheck renal function and to assess if a dosage change needed to be made based on CrCl. On 1/15 SCr had trended upwards to 1.18 with a CrCl 44 mL/min. Thus, he was changed to reduced-dose Xarelto 15 mg as well as addition of Mag-Ox. At follow-up on 07/20/16 he was doing well and without complaints. He was worried about his wife who was admitted in the hospital with an SBO. BP at  that time was noted to be elevated at 182/88. He was under significant stress with his wife's illness and had not taken his morning medications at that time. Labs checked on 1/30 showed a CrCl of 50 mL/minute, thus he was continued on reduced dose Xarelto.  Patient comes in doing well today. He continues to no dizziness most frequently with positional changes from lying to sitting. Upon sitting for  several seconds dizziness resolves and he is able to continue her bowel with his activities. He has not fully certain what medications he takes daily and does not have his medications with him today. Blood pressure in the office today is again noted to be elevated at 427 systolic when sitting with a peak of 192 supine. No chest pain, shortness of breath, palpitations, nausea, vomiting, presyncope, syncope. He reports drinking 120 ounce bottle of water daily along with a couple glasses of tea. He also notes intermittent pretibial edema and has received worn compression stockings. Of note, his wife is now out of the hospital from her SBO, though did have to return to the hospital for intraoperative treatment given adhesions. He otherwise has no concerns or complaints today.   Past Medical History:  Diagnosis Date  . Carotid artery stenosis    a. carotid doppler 11/2374: RICA 28-31%, LICA 5-17%  . Chronic diastolic CHF (congestive heart failure) ([place])    a. echo 05/2016: EF 55-60%, AK of the basal-midinferolateral and inferior myocardium (c/w prior study), GR1DD, aortic sclerosis without stenosis, mild MR, PASP 40 mmHg  . Coronary artery disease    Cardiac cath in February of 2013: EF 60%, high-grade chronic disease in OM 1, occluded mid RCA at the site of a previously placed stent with left-to-right collaterals, occluded left SFA.  . Diabetes mellitus with complication ([place])   . Hypercholesterolemia   . Hypertension   . PAF (paroxysmal atrial fibrillation) ([place])    a. Zio monitor 05/2016: predominant rhyhtm of sinus with intermittent episodes of Afib/flutter with the longest episode being 6 hours and 51 minutes with an average heart rate of 114 bpm. Occasional PACs and PVCs. b. CHADS2VASc --> 6 (CHF, HTN, age x 2, DM, vascular disease)    Past Surgical History:  Procedure Laterality Date  . BACK SURGERY     years ago  . CARDIAC CATHETERIZATION  07/2007   [facility]  . CARDIAC CATHETERIZATION  07/2011     [facility]  . CORONARY ANGIOPLASTY WITH STENT PLACEMENT      Current Outpatient Prescriptions  Medication Sig Dispense Refill  . atorvastatin (LIPITOR) 40 MG tablet Take 40 mg by mouth daily.    . finasteride (PROSCAR) 5 MG tablet Take 1 tablet (5 mg total) by mouth daily. 90 tablet 4  . fish oil-omega-3 fatty acids 1000 MG capsule Take 2 g by mouth daily.    Marland Kitchen glipiZIDE (GLUCOTROL XL) 5 MG 24 hr tablet     . IRON PO Take by mouth.    . linagliptin (TRADJENTA) 5 MG TABS tablet Take 5 mg by mouth daily.    . magnesium oxide (MAG-OX) 400 MG tablet Take 1 tablet (400 mg total) by mouth daily. 30 tablet 3  . metFORMIN (GLUCOPHAGE) 1000 MG tablet Take 1,000 mg by mouth 2 (two) times daily with a meal.    . metoprolol tartrate (LOPRESSOR) 50 MG tablet Take 1 tablet (50 mg total) by mouth 2 (two) times daily. 60 tablet 3  . ONETOUCH VERIO test strip     . ranitidine (ZANTAC)  150 MG tablet Take 1 tablet (150 mg total) by mouth daily. 30 tablet 3  . Rivaroxaban (XARELTO) 15 MG TABS tablet Take 1 tablet (15 mg total) by mouth daily with supper. 30 tablet 3  . saxagliptin HCl (ONGLYZA) 5 MG TABS tablet Take 5 mg by mouth daily.    . tamsulosin (FLOMAX) 0.4 MG CAPS capsule Take 1 capsule (0.4 mg total) by mouth daily. 90 capsule 4   No current facility-administered medications for this visit.     Allergies:   Patient has no known allergies.   Social History:  The patient  reports that he has been smoking Cigarettes.  He has a 25.00 pack-year smoking history. He has never used smokeless tobacco. He reports that he does not drink alcohol or use drugs.   Family History:  The patient's family history includes Dementia in his mother; Heart attack in his father.  ROS:   Review of Systems  Constitutional: Positive for malaise/fatigue. Negative for chills, diaphoresis, fever and weight loss.  HENT: Negative for congestion.   Eyes: Negative for discharge and redness.  Respiratory: Negative for cough,  hemoptysis, sputum production, shortness of breath and wheezing.   Cardiovascular: Positive for leg swelling. Negative for chest pain, palpitations, orthopnea, claudication and PND.  Gastrointestinal: Negative for abdominal pain, blood in stool, heartburn, melena, nausea and vomiting.  Genitourinary: Negative for hematuria.  Musculoskeletal: Negative for falls and myalgias.  Skin: Negative for rash.  Neurological: Positive for dizziness and weakness. Negative for tingling, tremors, sensory change, speech change, focal weakness and loss of consciousness.  Endo/Heme/Allergies: Does not bruise/bleed easily.  Psychiatric/Behavioral: Negative for substance abuse. The patient is not nervous/anxious.   All other systems reviewed and are negative.    PHYSICAL EXAM:  VS:  Pulse 70   Ht '5\' 3"'  (1.6 m)   Wt 148 lb (67.1 kg)   BMI 26.22 kg/m  BMI: Body mass index is 26.22 kg/m.  Physical Exam  Constitutional: He is oriented to person, place, and time. He appears well-developed and well-nourished.  HENT:  Head: Normocephalic and atraumatic.  Eyes: Right eye exhibits no discharge. Left eye exhibits no discharge.  Neck: Normal range of motion. No JVD present.  Cardiovascular: Normal rate, regular rhythm, S1 normal, S2 normal and normal heart sounds.  Exam reveals no distant heart sounds, no friction rub, no midsystolic click and no opening snap.   No murmur heard. Pulmonary/Chest: Effort normal and breath sounds normal. No respiratory distress. He has no decreased breath sounds. He has no wheezes. He has no rales. He exhibits no tenderness.  Abdominal: Soft. He exhibits no distension. There is no tenderness.  Musculoskeletal: He exhibits edema.  Trace pedal edema bilaterally   Neurological: He is alert and oriented to person, place, and time.  Skin: Skin is warm and dry. No cyanosis. Nails show no clubbing.  Psychiatric: He has a normal mood and affect. His speech is normal and behavior is normal.  Judgment and thought content normal.     EKG:  Was ordered and interpreted by me today. Shows NSR, 70 bpm, first degree AV block with PR interval 302 msec, trifascicular bock (1st degree AV block, RBBB, left anterior fascicular block), poor R wave progression, 0.5 mm horizontal st depression leads V4, V5, V6  Recent Labs: 06/17/2016: TSH 2.790 07/05/2016: Hemoglobin 12.5; Platelets 158 07/20/2016: BUN 17; Creatinine, Ser 1.04; Magnesium 2.0; Potassium 3.8; Sodium 134  No results found for requested labs within last 8760 hours.   CrCl cannot  be calculated (Patient's most recent lab result is older than the maximum 21 days allowed.).   Wt Readings from Last 3 Encounters:  08/18/16 148 lb (67.1 kg)  07/20/16 146 lb 12 oz (66.6 kg)  06/17/16 145 lb (65.8 kg)    Orthostatic Vital Signs: Lying: 192/80, 71 bpm Sitting: 172/72, 71 bpm Standing: 170/68, 74 bpm Standing x 3 min: 166/72, 73 bpm   Other studies reviewed: Additional studies/records reviewed today include: summarized above  ASSESSMENT AND PLAN:  1. PAF: Patient presents in sinus rhythm today with first-degree block. Continue rate control with Lopressor 50 mg twice a day. Long-term full dose anticoagulation with Xarelto 15 mg every dinner given creatinine clearance less than 50 mL/min. Check be met today to evaluate renal function. It is noted the patient continues to note episodic dizziness even in the setting of normal sinus rhythm making arrhythmia less likely as etiology of his dizziness.CHADS2VASc at least 6 (CHF, HTN, age x 2, DM, vascular disease). 2. Orthostatic hypotension/dizziness: Patient was noted to have dropped his systolic blood pressure from 192 to 172 upon sitting from supine position. He reports drinking 120 ounce bottle of water along with glasses of tea during the day for his fluid intake. Discussed with patient need for increased fluid intake. Patient and patient's wife will take a pressure twice daily and write  these readings down to bring to next appointment. He was also noted to be hypertensive as above during his last office visit as well as today. He is on Lopressor 50 mg twice a day as above for rate control as well as hypertension. Given his persistently elevated blood pressures will start low-dose lisinopril 5 mg daily in an effort to obtain better blood pressure control. Will need to be mindful of his orthostatic hypotension with initiation of further antihypertensive medication. 3. CAD: Currently no symptoms suggestive of angina. On Xarelto in place of ASA. No recent PCI. Continue Lipitor. No plans for further ischemic evaluation at this time. 4. Chronic diastolic CHF/venous insufficiency: Patient appears grossly euvolemic today though with trace pretibial edema likely secondary to venous insufficiency. Order compression stockings at this time to be worn daily with rest at night. We'll hold off on placing patient on standing or when necessary Lasix at this time given his orthostatic hypotension. Ultimately, patient may require when necessary Lasix if the swelling persists or worsens. 5. Hypertension: As above. 6. HLD: Continue Lipitor. 7. Medication management: Advised patient to bring pill bottles to next visit.   Disposition: F/u with me in 1 week.  Current medicines are reviewed at length with the patient today.  The patient did not have any concerns regarding medicines.  Melvern Banker PA-C 08/18/2016 1:31 PM     [place] [place] [address], [place] [address] [phone]

## 2016-08-19 LAB — BASIC METABOLIC PANEL
BUN / CREAT RATIO: 13 (ref 10–24)
BUN: 14 mg/dL (ref 8–27)
CALCIUM: 9.2 mg/dL (ref 8.6–10.2)
CO2: 26 mmol/L (ref 18–29)
Chloride: 93 mmol/L — ABNORMAL LOW (ref 96–106)
Creatinine, Ser: 1.04 mg/dL (ref 0.76–1.27)
GFR, EST AFRICAN AMERICAN: 76 mL/min/{1.73_m2} (ref >59)
GFR, EST NON AFRICAN AMERICAN: 66 mL/min/{1.73_m2} (ref >59)
Glucose: 141 mg/dL — ABNORMAL HIGH (ref 65–99)
Potassium: 4.1 mmol/L (ref 3.5–5.2)
Sodium: 137 mmol/L (ref 134–144)

## 2016-08-26 ENCOUNTER — Ambulatory Visit (INDEPENDENT_AMBULATORY_CARE_PROVIDER_SITE_OTHER): Payer: Medicare HMO | Admitting: Cardiovascular Disease

## 2016-08-26 ENCOUNTER — Encounter: Payer: Self-pay | Admitting: Cardiovascular Disease

## 2016-08-26 VITALS — BP 160/80 | HR 77 | Ht 63.0 in | Wt 149.0 lb

## 2016-08-26 DIAGNOSIS — I779 Disorder of arteries and arterioles, unspecified: Secondary | ICD-10-CM

## 2016-08-26 DIAGNOSIS — E785 Hyperlipidemia, unspecified: Secondary | ICD-10-CM

## 2016-08-26 DIAGNOSIS — I2510 Atherosclerotic heart disease of native coronary artery without angina pectoris: Secondary | ICD-10-CM | POA: Diagnosis not present

## 2016-08-26 DIAGNOSIS — I10 Essential (primary) hypertension: Secondary | ICD-10-CM

## 2016-08-26 DIAGNOSIS — I480 Paroxysmal atrial fibrillation: Secondary | ICD-10-CM | POA: Diagnosis not present

## 2016-08-26 DIAGNOSIS — I739 Peripheral vascular disease, unspecified: Secondary | ICD-10-CM

## 2016-08-26 MED ORDER — LISINOPRIL 20 MG PO TABS: 20.0000 mg | ORAL_TABLET | Freq: Every day | ORAL | 3 refills | Status: DC

## 2016-08-26 NOTE — Patient Instructions (Addendum)
Serum creatinine: 1.04 mg/dL 16/03/9601/28/18 [id number] Estimated creatinine clearance: 43.3 mL/min  Medication Instructions:  Your physician has recommended you make the following change in your medication:  INCREASE lisinopril to 20mg  once daily   Labwork: none  Testing/Procedures: none  Follow-Up: Your physician recommends that you schedule a follow-up appointment in: 4 months with Dr. Kirke CorinArida.    Any Other Special Instructions Will Be Listed Below (If Applicable).     If you need a refill on your cardiac medications before your next appointment, please call your pharmacy.

## 2016-08-26 NOTE — Progress Notes (Signed)
Cardiology Office Note   Date:  08/26/2016   ID:  Gary Lynn, DOB [birth year], MRN [id number]  PCP:  Sherrie MustacheFayegh Jadali  Cardiologist:   Lorine BearsMuhammad Kailynne Ferrington, MD   Chief Complaint  Patient presents with  . other    1 week follow up. Meds reviewed by the pt. verbally. "doing well."       History of Present Illness: Gary Lynn is a 83 y.o. male who presents for a follow-up visit regarding coronary artery disease And paroxysmal atrial fibrillation. The patient has known history of coronary artery disease with previous stenting of the right coronary artery years ago. Most recent cardiac catheterization was done in February of 2013 which showed an occluded RCA with left to right collaterals, high-grade complex disease in the OM1 which is known to be chronic, normal ejection fraction. The patient was treated medically. He has known history of hypertension, diabetes, PVCs, PAD and hyperlipidemia as well as asymptomatic carotid disease.  Most recent nuclear stress test in 2016 showed moderate to large inferior septal and inferolateral fixed defect with mild reversibility. Ejection fraction was normal. It was not much different from the most recent one in 2014.  The patient was diagnosed with paroxysmal atrial fibrillation noted on 14 days monitor. He was started on anticoagulation with Xarelto without complications. The patient has chronic dizziness thought to be due to orthostatic hypotension. During last visit, he was started on small dose lisinopril due to elevated blood pressure. His blood pressure continues to be elevated at home ranging from 170-190 mmHg. He denies chest pain, shortness of breath or palpitations.  Past Medical History:  Diagnosis Date  . Carotid artery stenosis    a. carotid doppler 05/2016: RICA 40-59%, LICA 1-39%  . Chronic diastolic CHF (congestive heart failure) ([facility])    a. echo 05/2016: EF 55-60%, AK of the basal-midinferolateral and inferior myocardium (c/w prior  study), GR1DD, aortic sclerosis without stenosis, mild MR, PASP 40 mmHg  . Coronary artery disease    Cardiac cath in February of 2013: EF 60%, high-grade chronic disease in OM 1, occluded mid RCA at the site of a previously placed stent with left-to-right collaterals, occluded left SFA.  . Diabetes mellitus with complication ([facility])   . Hypercholesterolemia   . Hypertension   . PAF (paroxysmal atrial fibrillation) ([facility])    a. Zio monitor 05/2016: predominant rhyhtm of sinus with intermittent episodes of Afib/flutter with the longest episode being 6 hours and 51 minutes with an average heart rate of 114 bpm. Occasional PACs and PVCs. b. CHADS2VASc --> 6 (CHF, HTN, age x 2, DM, vascular disease)    Past Surgical History:  Procedure Laterality Date  . BACK SURGERY     years ago  . CARDIAC CATHETERIZATION  07/2007   [facility]  . CARDIAC CATHETERIZATION  07/2011   [facility]  . CORONARY ANGIOPLASTY WITH STENT PLACEMENT       Current Outpatient Prescriptions  Medication Sig Dispense Refill  . atorvastatin (LIPITOR) 40 MG tablet Take 40 mg by mouth daily.    . finasteride (PROSCAR) 5 MG tablet Take 1 tablet (5 mg total) by mouth daily. 90 tablet 4  . fish oil-omega-3 fatty acids 1000 MG capsule Take 2 g by mouth daily.    Marland Kitchen. glipiZIDE (GLUCOTROL XL) 5 MG 24 hr tablet Take 5 mg by mouth daily with breakfast.     . IRON PO Take by mouth.    . linagliptin (TRADJENTA) 5 MG TABS tablet Take 5 mg  by mouth daily.    Marland Kitchen lisinopril (PRINIVIL,ZESTRIL) 5 MG tablet Take 1 tablet (5 mg total) by mouth daily. 30 tablet 3  . magnesium oxide (MAG-OX) 400 MG tablet Take 1 tablet (400 mg total) by mouth daily. 30 tablet 3  . metFORMIN (GLUCOPHAGE) 1000 MG tablet Take 1,000 mg by mouth 2 (two) times daily with a meal.    . metoprolol tartrate (LOPRESSOR) 50 MG tablet Take 1 tablet (50 mg total) by mouth 2 (two) times daily. 60 tablet 3  . ONETOUCH VERIO test strip     . ranitidine (ZANTAC) 150 MG tablet Take 1 tablet (150  mg total) by mouth daily. 30 tablet 3  . Rivaroxaban (XARELTO) 15 MG TABS tablet Take 1 tablet (15 mg total) by mouth daily with supper. 30 tablet 3  . saxagliptin HCl (ONGLYZA) 5 MG TABS tablet Take 5 mg by mouth daily.    . tamsulosin (FLOMAX) 0.4 MG CAPS capsule Take 1 capsule (0.4 mg total) by mouth daily. 90 capsule 4   No current facility-administered medications for this visit.     Allergies:   Patient has no known allergies.    Social History:  The patient  reports that he has been smoking Cigarettes.  He has a 25.00 pack-year smoking history. He has never used smokeless tobacco. He reports that he does not drink alcohol or use drugs.   Family History:  The patient's family history includes Dementia in his mother; Heart attack in his father.    ROS:  Please see the history of present illness.   Otherwise, review of systems are positive for none.   All other systems are reviewed and negative.    PHYSICAL EXAM: VS:  BP (!) 160/80 (BP Location: Left Arm, Patient Position: Sitting, Cuff Size: Normal)   Pulse 77   Ht 5\' 3"  (1.6 m)   Wt 149 lb (67.6 kg)   BMI 26.39 kg/m  , BMI Body mass index is 26.39 kg/m. GEN: Well nourished, well developed, in no acute distress  HEENT: normal  Neck: no JVD, carotid bruits, or masses Cardiac: RRR; no murmurs, rubs, or gallops,no edema  Respiratory:  clear to auscultation bilaterally, normal work of breathing GI: soft, nontender, nondistended, + BS MS: no deformity or atrophy  Skin: warm and dry, no rash Neuro:  Strength and sensation are intact Psych: euthymic mood, full affect Right radial pulse is normal. Left radial pulse is diminished.   EKG:  EKG is ordered today. The ekg ordered today demonstrates normal sinus rhythm with first degree AV block and  right bundle branch block, left anterior fascicular block.   Recent Labs: 06/17/2016: TSH 2.790 07/05/2016: Hemoglobin 12.5; Platelets 158 07/20/2016: Magnesium 2.0 08/18/2016: BUN  14; Creatinine, Ser 1.04; Potassium 4.1; Sodium 137    Lipid Panel No results found for: CHOL, TRIG, HDL, CHOLHDL, VLDL, LDLCALC, LDLDIRECT    Wt Readings from Last 3 Encounters:  08/26/16 149 lb (67.6 kg)  08/18/16 148 lb (67.1 kg)  07/20/16 146 lb 12 oz (66.6 kg)         ASSESSMENT AND PLAN:  1.  Coronary artery disease involving native coronary arteries without angina: He has no anginal symptoms at the present time and he continues to be active. Continue medical therapy.  Antiplatelet medications were discontinued given that he is on anticoagulation.  2. Paroxysmal atrial fibrillation: He is currently in sinus rhythm. He is tolerating Xarelto. Creatinine clearance is below 50 and thus 15 mg daily is appropriate  dose.  3. Orthostatic hypotension: Seems to be stable.  4. Right Carotid disease: Repeat carotid Doppler in December 2018. There was 40-59% right ICA stenosis.  5. Essential hypertension: Blood pressure continues to be elevated. I elected to increase the dose of lisinopril to 20 mg daily.  6. Hyperlipidemia: Continue treatment with atorvastatin with a target LDL of less than 70.   Disposition:   FU with me in 4 months  Signed,  Lorine Bears, MD  08/26/2016 3:13 PM    [facility]

## 2017-01-12 NOTE — Progress Notes (Signed)
01/13/2017 10:34 AM   Lequita Halt 09-30-1930 [id number]  Referring provider: Sherrie Mustache, MD [address] [place], [place] [address]  Chief Complaint  Patient presents with  . Benign Prostatic Hypertrophy    1 year follow up   . Nocturia    HPI: Mr. Mccorkel is an 83 year old Caucasian male with a h/o urethral stricture and BPH with LUTS who presents today for a one year follow up.  History of urethral stricture Patient underwent a difficult foley placement with filiform's and followers in 2014 by Dr. Edwyna Shell for a PVR of 500 mL.  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  He had the foley in place for several days and was started on tamsulosin and finasteride.  He currently denies any difficulty with urination.  PVR was 33 mL.   BPH WITH LUTS His IPSS score today is 9, which is moderate lower urinary tract symptomatology. He is mostly satisfied with his quality life due to his urinary symptoms.  His previous IPSS score was 11/5.  His previous PVR is 105 mL.  His major complaint today Iis urinary leakage.  He has had these symptoms for a good while.  He denies any dysuria, hematuria or suprapubic pain.  He currently taking tamsulosin 0.4 mg daily and finasteride 5 mg daily.  He also denies any recent fevers, chills, nausea or vomiting.  He does not have a family history of PCa.  Patient admits to drinking large amounts of sweet tea in the evenings.        IPSS    Row Name 01/13/17 1000         International Prostate Symptom Score   How often have you had the sensation of not emptying your bladder? Less than 1 in 5     How often have you had to urinate less than every two hours? Less than half the time     How often have you found you stopped and started again several times when you urinated? Not at All     How often have you found it difficult to postpone urination? Less than half the time     How often have you had a weak urinary stream? Less than 1 in 5  times     How often have you had to strain to start urination? Not at All     How many times did you typically get up at night to urinate? 3 Times     Total IPSS Score 9       Quality of Life due to urinary symptoms   If you were to spend the rest of your life with your urinary condition just the way it is now how would you feel about that? Mostly Satisfied        Score:  1-7 Mild 8-19 Moderate 20-35 Severe    PMH: Past Medical History:  Diagnosis Date  . Carotid artery stenosis    a. carotid doppler 05/2016: RICA 40-59%, LICA 1-39%  . Chronic diastolic CHF (congestive heart failure) (HCC)    a. echo 05/2016: EF 55-60%, AK of the basal-midinferolateral and inferior myocardium (c/w prior study), GR1DD, aortic sclerosis without stenosis, mild MR, PASP 40 mmHg  . Coronary artery disease    Cardiac cath in February of 2013: EF 60%, high-grade chronic disease in OM 1, occluded mid RCA at the site of a previously placed stent with left-to-right collaterals, occluded left SFA.  . Diabetes mellitus  with complication (HCC)   . Hypercholesterolemia   . Hypertension   . PAF (paroxysmal atrial fibrillation) (HCC)    a. Zio monitor 05/2016: predominant rhyhtm of sinus with intermittent episodes of Afib/flutter with the longest episode being 6 hours and 51 minutes with an average heart rate of 114 bpm. Occasional PACs and PVCs. b. CHADS2VASc --> 6 (CHF, HTN, age x 2, DM, vascular disease)    Surgical History: Past Surgical History:  Procedure Laterality Date  . BACK SURGERY     years ago  . CARDIAC CATHETERIZATION  07/2007   [facility]  . CARDIAC CATHETERIZATION  07/2011   [facility]  . CORONARY ANGIOPLASTY WITH STENT PLACEMENT      Home Medications:  Allergies as of 01/13/2017   No Known Allergies     Medication List       Accurate as of 01/13/17 10:34 AM. Always use your most recent med list.          atorvastatin 40 MG tablet Commonly known as:  LIPITOR Take 40 mg by mouth daily.     clopidogrel 75 MG tablet Commonly known as:  PLAVIX Take 75 mg by mouth daily.   finasteride 5 MG tablet Commonly known as:  PROSCAR Take 1 tablet (5 mg total) by mouth daily.   fish oil-omega-3 fatty acids 1000 MG capsule Take 2 g by mouth daily.   glipiZIDE 5 MG 24 hr tablet Commonly known as:  GLUCOTROL XL Take 5 mg by mouth daily with breakfast.   IRON PO Take by mouth.   lisinopril 20 MG tablet Commonly known as:  PRINIVIL,ZESTRIL Take 1 tablet (20 mg total) by mouth daily.   magnesium oxide 400 MG tablet Commonly known as:  MAG-OX Take 1 tablet (400 mg total) by mouth daily.   metFORMIN 1000 MG tablet Commonly known as:  GLUCOPHAGE Take 1,000 mg by mouth 2 (two) times daily with a meal.   metoprolol tartrate 50 MG tablet Commonly known as:  LOPRESSOR Take 1 tablet (50 mg total) by mouth 2 (two) times daily.   ONETOUCH VERIO test strip Generic drug:  glucose blood   ONGLYZA 5 MG Tabs tablet Generic drug:  saxagliptin HCl Take 5 mg by mouth daily.   ranitidine 150 MG tablet Commonly known as:  ZANTAC Take 1 tablet (150 mg total) by mouth daily.   Rivaroxaban 15 MG Tabs tablet Commonly known as:  XARELTO Take 1 tablet (15 mg total) by mouth daily with supper.   tamsulosin 0.4 MG Caps capsule Commonly known as:  FLOMAX Take 1 capsule (0.4 mg total) by mouth daily.   TRADJENTA 5 MG Tabs tablet Generic drug:  linagliptin Take 5 mg by mouth daily.       Allergies: No Known Allergies  Family History: Family History  Problem Relation Age of Onset  . Dementia Mother   . Heart attack Father   . Kidney disease Neg Hx   . Prostate cancer Neg Hx     Social History:  reports that he has been smoking Cigarettes.  He has a 25.00 pack-year smoking history. He has never used smokeless tobacco. He reports that he does not drink alcohol or use drugs.  ROS: UROLOGY Frequent Urination?: No Hard to postpone urination?: No Burning/pain with urination?:  No Get up at night to urinate?: No Leakage of urine?: Yes Urine stream starts and stops?: No Trouble starting stream?: No Do you have to strain to urinate?: No Blood in urine?: No Urinary tract infection?: No  Sexually transmitted disease?: No Injury to kidneys or bladder?: No Painful intercourse?: No Weak stream?: No Erection problems?: No Penile pain?: No  Gastrointestinal Nausea?: No Vomiting?: No Indigestion/heartburn?: Yes Diarrhea?: No Constipation?: No  Constitutional Fever: No Night sweats?: No Weight loss?: No Fatigue?: No  Skin Skin rash/lesions?: No Itching?: No  Eyes Blurred vision?: No Double vision?: No  Ears/Nose/Throat Sore throat?: No Sinus problems?: No  Hematologic/Lymphatic Swollen glands?: No Easy bruising?: No  Cardiovascular Leg swelling?: No Chest pain?: No  Respiratory Cough?: No Shortness of breath?: No  Endocrine Excessive thirst?: No  Musculoskeletal Back pain?: No Joint pain?: No  Neurological Headaches?: No Dizziness?: No  Psychologic Depression?: No Anxiety?: No  Physical Exam: BP (!) 163/73   Pulse 69   Ht 5\' 3"  (1.6 m)   Wt 141 lb 3.2 oz (64 kg)   BMI 25.01 kg/m   GU: Patient with uncircumcised phallus. Foreskin easily retracted  Urethral meatus is patent.  No penile discharge. No penile lesions or rashes. Scrotum without lesions, cysts, rashes and/or edema.  Testicles are located scrotally bilaterally and atrophic.  No masses are appreciated in the testicles. Left and right epididymis are normal. Rectal: Patient with  normal sphincter tone. Perineum without scarring or rashes. No rectal masses are appreciated. Prostate is approximately 50 grams, no nodules are appreciated. Seminal vesicles are normal.   Laboratory Data:  Lab Results  Component Value Date   WBC 5.7 07/05/2016   HGB 12.5 (L) 07/05/2016   HCT 36.2 (L) 07/05/2016   MCV 86.6 07/05/2016   PLT 158 07/05/2016   Previous PSA's:     3.0  ng/mL on 03/20/2014  I have reviewed the labs   Assessment & Plan:    1. BPH with LUTS  - IPSS score is 9/2,  it is improving   - Continue conservative management, avoiding bladder irritants and timed voiding's  - Continue tamsulosin 0.4 mg daily and finasteride 5 mg daily  - RTC in 12 months for IPSS and exam   2. Other specified causes of urethral stricture:  Patient is voiding well.    3. Nocturia  - not interested in pursuing a sleep study   Return in about 1 year (around 01/13/2018) for I PSS and exam.  Michiel CowboySHANNON Stamatia Masri, [facility]  [facility] [address], [address] [place], [place] [address] [phone]

## 2017-01-13 ENCOUNTER — Ambulatory Visit (INDEPENDENT_AMBULATORY_CARE_PROVIDER_SITE_OTHER): Payer: Medicare HMO | Admitting: Urology

## 2017-01-13 ENCOUNTER — Encounter: Payer: Self-pay | Admitting: Urology

## 2017-01-13 VITALS — BP 163/73 | HR 69 | Ht 63.0 in | Wt 141.2 lb

## 2017-01-13 DIAGNOSIS — N138 Other obstructive and reflux uropathy: Secondary | ICD-10-CM

## 2017-01-13 DIAGNOSIS — N401 Enlarged prostate with lower urinary tract symptoms: Secondary | ICD-10-CM

## 2017-01-13 DIAGNOSIS — IMO0002 Reserved for concepts with insufficient information to code with codable children: Secondary | ICD-10-CM

## 2017-01-13 DIAGNOSIS — N358 Other urethral stricture: Secondary | ICD-10-CM | POA: Diagnosis not present

## 2017-01-13 DIAGNOSIS — R351 Nocturia: Secondary | ICD-10-CM | POA: Diagnosis not present

## 2017-01-13 MED ORDER — FINASTERIDE 5 MG PO TABS: 5.0000 mg | ORAL_TABLET | Freq: Every day | ORAL | 4 refills | Status: DC

## 2017-01-14 ENCOUNTER — Ambulatory Visit: Payer: Medicare HMO | Admitting: Urology

## 2017-01-14 ENCOUNTER — Ambulatory Visit: Payer: Medicare HMO | Admitting: Cardiovascular Disease

## 2017-02-22 ENCOUNTER — Ambulatory Visit: Payer: Medicare HMO | Admitting: Cardiovascular Disease

## 2017-03-03 ENCOUNTER — Other Ambulatory Visit: Payer: Self-pay

## 2017-03-03 ENCOUNTER — Telehealth: Payer: Self-pay | Admitting: Cardiovascular Disease

## 2017-03-03 MED ORDER — RIVAROXABAN 15 MG PO TABS: 15.0000 mg | ORAL_TABLET | Freq: Every day | ORAL | 5 refills | Status: DC

## 2017-03-03 NOTE — Telephone Encounter (Addendum)
S/w pt who reports he is taking both plavix 75mg  and xarelto 15mg . Pt reported he was not taking xarelto at his 7/26 urology appt. Per Dr. Kirke CorinArida, instructed pt to stop taking plavix and continue xarelto. [facility].  She states plavix is not on pt's profile.  Updated Dr. Kirke CorinArida. No intervention needed at this time.

## 2017-03-07 ENCOUNTER — Telehealth: Payer: Self-pay | Admitting: Cardiovascular Disease

## 2017-03-07 NOTE — Telephone Encounter (Signed)
Pt wife calling  PT has questions regarding medications Please call to discuss

## 2017-03-07 NOTE — Telephone Encounter (Signed)
Returned call to patient's wife, ok per DPR. She wanted to clarify medication instructions.  She verbalized understanding that patient was to stop Plavix and continue Xarelto. She said they went to [facility] pharmacy but they didn't have any prescriptions for the patient to pick up.  I called the pharmacy and they do have the Rx for Xarelto on file but its not due for refill until 03/21/17. It was last filled on 01/12/17 through mail order.   Notifed wife and she verbalized understanding. She said patient should have enough medication to last until next refill and she will call us back if needed.

## 2017-05-19 ENCOUNTER — Encounter: Payer: Self-pay | Admitting: Cardiovascular Disease

## 2017-05-19 ENCOUNTER — Ambulatory Visit: Payer: Medicare HMO | Admitting: Cardiovascular Disease

## 2017-05-19 VITALS — BP 192/78 | HR 68 | Ht 63.0 in | Wt 141.0 lb

## 2017-05-19 DIAGNOSIS — E785 Hyperlipidemia, unspecified: Secondary | ICD-10-CM

## 2017-05-19 DIAGNOSIS — I480 Paroxysmal atrial fibrillation: Secondary | ICD-10-CM

## 2017-05-19 DIAGNOSIS — I779 Disorder of arteries and arterioles, unspecified: Secondary | ICD-10-CM | POA: Diagnosis not present

## 2017-05-19 DIAGNOSIS — I2510 Atherosclerotic heart disease of native coronary artery without angina pectoris: Secondary | ICD-10-CM

## 2017-05-19 DIAGNOSIS — I739 Peripheral vascular disease, unspecified: Secondary | ICD-10-CM

## 2017-05-19 DIAGNOSIS — I10 Essential (primary) hypertension: Secondary | ICD-10-CM | POA: Diagnosis not present

## 2017-05-19 NOTE — Patient Instructions (Signed)
Medication Instructions: Continue same medications.   Labwork: None.   Procedures/Testing: None.   Follow-Up: 6 months with Dr. Kristalyn Bergstresser.   Any Additional Special Instructions Will Be Listed Below (If Applicable).     If you need a refill on your cardiac medications before your next appointment, please call your pharmacy.   

## 2017-05-19 NOTE — Progress Notes (Signed)
Cardiology Office Note   Date:  05/19/2017   ID:  Gary Lynn, DOB 07/03/1930, MRN [id number]  PCP:  Sherrie MustacheJadali, Fayegh, MD  Cardiologist:   Lorine BearsMuhammad Arida, MD   Chief Complaint  Patient presents with  . other    OD 4 month f/u c/o dizziness d/c Xarelto too expensive. Meds reviewed verbally with pt.      History of Present Illness: Gary Lynn is a 83 y.o. male who presents for a follow-up visit regarding coronary artery disease and paroxysmal atrial fibrillation. The patient has known history of coronary artery disease with previous stenting of the right coronary artery years ago. Most recent cardiac catheterization was done in February of 2013 which showed an occluded RCA with left to right collaterals, high-grade complex disease in the OM1 which is known to be chronic, normal ejection fraction. The patient was treated medically. He has known history of hypertension, diabetes, PVCs, PAD and hyperlipidemia as well as asymptomatic carotid disease.  Most recent nuclear stress test in 2016 showed moderate to large inferior septal and inferolateral fixed defect with mild reversibility. Ejection fraction was normal. It was not much different from the most recent one in 2014.  The patient was diagnosed with paroxysmal atrial fibrillation noted on 14 days monitor. He was started on anticoagulation with Xarelto . The patient has not been able to afford Xarelto due to a monthly cost of $146.  Thus, he went back on Plavix.  He seems to be having more problems with orthostatic dizziness and had multiple falls recently.  No chest pain or worsening dyspnea.   Past Medical History:  Diagnosis Date  . Carotid artery stenosis    a. carotid doppler 05/2016: RICA 40-59%, LICA 1-39%  . Chronic diastolic CHF (congestive heart failure) (HCC)    a. echo 05/2016: EF 55-60%, AK of the basal-midinferolateral and inferior myocardium (c/w prior study), GR1DD, aortic sclerosis without stenosis, mild MR,  PASP 40 mmHg  . Coronary artery disease    Cardiac cath in February of 2013: EF 60%, high-grade chronic disease in OM 1, occluded mid RCA at the site of a previously placed stent with left-to-right collaterals, occluded left SFA.  . Diabetes mellitus with complication (HCC)   . Hypercholesterolemia   . Hypertension   . PAF (paroxysmal atrial fibrillation) (HCC)    a. Zio monitor 05/2016: predominant rhyhtm of sinus with intermittent episodes of Afib/flutter with the longest episode being 6 hours and 51 minutes with an average heart rate of 114 bpm. Occasional PACs and PVCs. b. CHADS2VASc --> 6 (CHF, HTN, age x 2, DM, vascular disease)    Past Surgical History:  Procedure Laterality Date  . BACK SURGERY     years ago  . CARDIAC CATHETERIZATION  07/2007   [facility]  . CARDIAC CATHETERIZATION  07/2011   [facility]  . CORONARY ANGIOPLASTY WITH STENT PLACEMENT       Current Outpatient Medications  Medication Sig Dispense Refill  . atorvastatin (LIPITOR) 40 MG tablet Take 40 mg by mouth daily.    . clopidogrel (PLAVIX) 75 MG tablet Take 75 mg by mouth daily.    . finasteride (PROSCAR) 5 MG tablet Take 1 tablet (5 mg total) by mouth daily. 90 tablet 4  . fish oil-omega-3 fatty acids 1000 MG capsule Take 2 g by mouth daily.    Marland Kitchen. glipiZIDE (GLUCOTROL XL) 5 MG 24 hr tablet Take 5 mg by mouth daily with breakfast.     . IRON PO Take  by mouth.    . linagliptin (TRADJENTA) 5 MG TABS tablet Take 5 mg by mouth daily.    . magnesium oxide (MAG-OX) 400 MG tablet Take 1 tablet (400 mg total) by mouth daily. 30 tablet 3  . metFORMIN (GLUCOPHAGE) 1000 MG tablet Take 1,000 mg by mouth 2 (two) times daily with a meal.    . metoprolol tartrate (LOPRESSOR) 50 MG tablet Take 1 tablet (50 mg total) by mouth 2 (two) times daily. 60 tablet 3  . ONETOUCH VERIO test strip     . ranitidine (ZANTAC) 150 MG tablet Take 1 tablet (150 mg total) by mouth daily. 30 tablet 3  . saxagliptin HCl (ONGLYZA) 5 MG TABS tablet Take 5 mg  by mouth daily.    . tamsulosin (FLOMAX) 0.4 MG CAPS capsule Take 1 capsule (0.4 mg total) by mouth daily. 90 capsule 4  . lisinopril (PRINIVIL,ZESTRIL) 20 MG tablet Take 1 tablet (20 mg total) by mouth daily. 90 tablet 3   No current facility-administered medications for this visit.     Allergies:   Patient has no known allergies.    Social History:  The patient  reports that he has been smoking cigarettes.  He has a 25.00 pack-year smoking history. he has never used smokeless tobacco. He reports that he does not drink alcohol or use drugs.   Family History:  The patient's family history includes Dementia in his mother; Heart attack in his father.    ROS:  Please see the history of present illness.   Otherwise, review of systems are positive for none.   All other systems are reviewed and negative.    PHYSICAL EXAM: VS:  BP (!) 192/78 (BP Location: Right Arm, Patient Position: Sitting, Cuff Size: Normal)   Pulse 68   Ht 5\' 3"  (1.6 m)   Wt 141 lb (64 kg)   BMI 24.98 kg/m  , BMI Body mass index is 24.98 kg/m. GEN: Well nourished, well developed, in no acute distress  HEENT: normal  Neck: no JVD, carotid bruits, or masses Cardiac: RRR; no murmurs, rubs, or gallops,no edema  Respiratory:  clear to auscultation bilaterally, normal work of breathing GI: soft, nontender, nondistended, + BS MS: no deformity or atrophy  Skin: warm and dry, no rash Neuro:  Strength and sensation are intact Psych: euthymic mood, full affect Right radial pulse is normal. Left radial pulse is diminished.   EKG:  EKG is ordered today. The ekg ordered today demonstrates normal sinus rhythm with first degree AV block and  right bundle branch block, left anterior fascicular block.   Recent Labs: 06/17/2016: TSH 2.790 07/05/2016: Hemoglobin 12.5; Platelets 158 07/20/2016: Magnesium 2.0 08/18/2016: BUN 14; Creatinine, Ser 1.04; Potassium 4.1; Sodium 137    Lipid Panel No results found for: CHOL, TRIG,  HDL, CHOLHDL, VLDL, LDLCALC, LDLDIRECT    Wt Readings from Last 3 Encounters:  05/19/17 141 lb (64 kg)  01/13/17 141 lb 3.2 oz (64 kg)  08/26/16 149 lb (67.6 kg)         ASSESSMENT AND PLAN:  1.  Coronary artery disease involving native coronary arteries without angina: He is doing reasonably well.  Continue Plavix given that he stopped Xarelto.  2. Paroxysmal atrial fibrillation: He is currently in sinus rhythm.  Unfortunately, he could not afford Xarelto due to cost.  Also has been having frequent falls recently.  His A. fib burden has been overall low and was only detected on a 14-day monitor.  Due to all  of the above, it appears that the risks of anticoagulation outweigh the benefit.  3. Orthostatic hypotension: Seems to be stable.  4. Right Carotid disease: There was 40-59% right ICA stenosis.  I will consider repeat carotid Doppler next year.  5. Essential hypertension: Blood pressure continues to be elevated.  However, given his significant orthostatic hypotension, it is difficult to be very aggressive with this.  6. Hyperlipidemia: Continue treatment with atorvastatin with a target LDL of less than 70.   Disposition:   FU with me in 6 months  Signed,  Lorine Bears, MD  05/19/2017 2:09 PM    [facility]

## 2017-06-08 ENCOUNTER — Ambulatory Visit (INDEPENDENT_AMBULATORY_CARE_PROVIDER_SITE_OTHER): Payer: Medicare HMO

## 2017-06-08 DIAGNOSIS — I6523 Occlusion and stenosis of bilateral carotid arteries: Secondary | ICD-10-CM

## 2017-06-09 ENCOUNTER — Other Ambulatory Visit: Payer: Self-pay | Admitting: *Deleted

## 2017-06-09 ENCOUNTER — Telehealth: Payer: Self-pay | Admitting: Cardiovascular Disease

## 2017-06-09 DIAGNOSIS — I6529 Occlusion and stenosis of unspecified carotid artery: Secondary | ICD-10-CM

## 2017-06-09 NOTE — Telephone Encounter (Signed)
Pt wife returning our call for results  Please call back

## 2017-06-09 NOTE — Telephone Encounter (Signed)
Called and s/w patient's wife, ok per DPR. She verbalized understanding of carotid results.

## 2017-07-08 ENCOUNTER — Telehealth: Payer: Self-pay | Admitting: *Deleted

## 2017-07-08 NOTE — Telephone Encounter (Signed)
error 

## 2017-12-14 NOTE — Progress Notes (Signed)
12/15/2017 8:44 PM   Gary Lynn 03-26-1931 [id number]  Referring provider: Sherrie Mustache, MD [address] [place], [place] [address]  Chief Complaint  Patient presents with  . Benign Prostatic Hypertrophy    HPI: Gary Lynn is an 84 year old Caucasian male with a h/o urethral stricture and BPH with LUTS who presents today for a one year follow up.  History of urethral stricture Patient underwent a difficult foley placement with filiform's and followers in 2014 by Dr. Edwyna Shell for a PVR of 500 mL.  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  He had the foley in place for several days and was started on tamsulosin and finasteride.  He currently denies any difficulty with urination.  PVR was 33 mL.   BPH WITH LUTS His IPSS score today is 4, which is mild lower urinary tract symptomatology. He is mixed with his quality life due to his urinary symptoms.  His previous IPSS score was 9/2.  His previous PVR is 105 mL.  His major complaints today are frequency and nocturia.  He has had these symptoms for a good while.  He denies any dysuria, hematuria or suprapubic pain.  He currently taking tamsulosin 0.4 mg daily and finasteride 5 mg daily.  He also denies any recent fevers, chills, nausea or vomiting.  He does not have a family history of PCa.  Patient admits to drinking large amounts of sweet tea in the evenings.    IPSS    Row Name 12/15/17 1600         International Prostate Symptom Score   How often have you had the sensation of not emptying your bladder?  Not at All     How often have you had to urinate less than every two hours?  Less than 1 in 5 times     How often have you found you stopped and started again several times when you urinated?  Not at All     How often have you found it difficult to postpone urination?  Less than 1 in 5 times     How often have you had a weak urinary stream?  Not at All     How often have you had to strain to start  urination?  Not at All     How many times did you typically get up at night to urinate?  2 Times     Total IPSS Score  4       Quality of Life due to urinary symptoms   If you were to spend the rest of your life with your urinary condition just the way it is now how would you feel about that?  Mixed        Score:  1-7 Mild 8-19 Moderate 20-35 Severe    PMH: Past Medical History:  Diagnosis Date  . Carotid artery stenosis    a. carotid doppler 05/2016: RICA 40-59%, LICA 1-39%  . Chronic diastolic CHF (congestive heart failure) (HCC)    a. echo 05/2016: EF 55-60%, AK of the basal-midinferolateral and inferior myocardium (c/w prior study), GR1DD, aortic sclerosis without stenosis, mild MR, PASP 40 mmHg  . Coronary artery disease    Cardiac cath in February of 2013: EF 60%, high-grade chronic disease in OM 1, occluded mid RCA at the site of a previously placed stent with left-to-right collaterals, occluded left SFA.  . Diabetes mellitus with complication (HCC)   . Hypercholesterolemia   .  Hypertension   . PAF (paroxysmal atrial fibrillation) (HCC)    a. Zio monitor 05/2016: predominant rhyhtm of sinus with intermittent episodes of Afib/flutter with the longest episode being 6 hours and 51 minutes with an average heart rate of 114 bpm. Occasional PACs and PVCs. b. CHADS2VASc --> 6 (CHF, HTN, age x 2, DM, vascular disease)    Surgical History: Past Surgical History:  Procedure Laterality Date  . BACK SURGERY     years ago  . CARDIAC CATHETERIZATION  07/2007   [facility]  . CARDIAC CATHETERIZATION  07/2011   [facility]  . CORONARY ANGIOPLASTY WITH STENT PLACEMENT      Home Medications:  Allergies as of 12/15/2017   No Known Allergies     Medication List        Accurate as of 12/15/17  8:44 PM. Always use your most recent med list.          atorvastatin 40 MG tablet Commonly known as:  LIPITOR Take 40 mg by mouth daily.   carvedilol 12.5 MG tablet Commonly known as:  COREG Take  1 tablet (12.5 mg total) by mouth 2 (two) times daily.   finasteride 5 MG tablet Commonly known as:  PROSCAR Take 1 tablet (5 mg total) by mouth daily.   fish oil-omega-3 fatty acids 1000 MG capsule Take 2 g by mouth daily.   glipiZIDE 5 MG 24 hr tablet Commonly known as:  GLUCOTROL XL Take 5 mg by mouth daily with breakfast.   IRON PO Take 325 mg by mouth daily.   lisinopril 10 MG tablet Commonly known as:  PRINIVIL,ZESTRIL Take 10 mg by mouth 2 (two) times daily. 2 tablets BID   magnesium oxide 400 MG tablet Commonly known as:  MAG-OX Take 1 tablet (400 mg total) by mouth daily.   metFORMIN 1000 MG tablet Commonly known as:  GLUCOPHAGE Take 1,000 mg by mouth 2 (two) times daily with a meal.   metoprolol tartrate 25 MG tablet Commonly known as:  LOPRESSOR Take 25 mg by mouth 2 (two) times daily.   ONETOUCH VERIO test strip Generic drug:  glucose blood   ranitidine 150 MG tablet Commonly known as:  ZANTAC Take 1 tablet (150 mg total) by mouth daily.   tamsulosin 0.4 MG Caps capsule Commonly known as:  FLOMAX Take 1 capsule (0.4 mg total) by mouth daily.   TRADJENTA 5 MG Tabs tablet Generic drug:  linagliptin Take 5 mg by mouth daily.   XARELTO 20 MG Tabs tablet Generic drug:  rivaroxaban       Allergies: No Known Allergies  Family History: Family History  Problem Relation Age of Onset  . Dementia Mother   . Heart attack Father   . Kidney disease Neg Hx   . Prostate cancer Neg Hx     Social History:  reports that he has been smoking cigarettes.  He has a 25.00 pack-year smoking history. He has never used smokeless tobacco. He reports that he does not drink alcohol or use drugs.  ROS: UROLOGY Frequent Urination?: Yes Hard to postpone urination?: Yes Burning/pain with urination?: No Get up at night to urinate?: No Leakage of urine?: No Urine stream starts and stops?: No Trouble starting stream?: No Do you have to strain to urinate?: No Blood in  urine?: No Urinary tract infection?: No Sexually transmitted disease?: No Injury to kidneys or bladder?: No Painful intercourse?: No Weak stream?: No Erection problems?: No Penile pain?: No  Gastrointestinal Nausea?: No Vomiting?: No Indigestion/heartburn?: No  Diarrhea?: No Constipation?: No  Constitutional Fever: No Night sweats?: No Weight loss?: No Fatigue?: No  Skin Skin rash/lesions?: No Itching?: No  Eyes Blurred vision?: No Double vision?: No  Ears/Nose/Throat Sore throat?: No Sinus problems?: No  Hematologic/Lymphatic Swollen glands?: No Easy bruising?: No  Cardiovascular Leg swelling?: No Chest pain?: No  Respiratory Cough?: No Shortness of breath?: No  Endocrine Excessive thirst?: No  Musculoskeletal Back pain?: No Joint pain?: No  Neurological Headaches?: No Dizziness?: No  Psychologic Depression?: No Anxiety?: No  Physical Exam: BP (!) 206/87 (BP Location: Left Arm, Cuff Size: Normal)   Pulse 87   Temp 98.6 F (37 C) (Oral)   Resp 16   Wt 140 lb (63.5 kg)   BMI 24.80 kg/m   Constitutional: Well nourished. Alert and oriented, No acute distress. HEENT: [place] AT, moist mucus membranes. Trachea midline, no masses. Cardiovascular: No clubbing, cyanosis, or edema. Respiratory: Normal respiratory effort, no increased work of breathing. GI: Abdomen is soft, non tender, non distended, no abdominal masses. Liver and spleen not palpable.  No hernias appreciated.  Stool sample for occult testing is not indicated.   GU: No CVA tenderness.  No bladder fullness or masses.  Patient with uncircumcised phallus.  Foreskin easily retracted.   Urethral meatus is patent.  No penile discharge. No penile lesions or rashes. Scrotum without lesions, cysts, rashes and/or edema.  Testicles are located scrotally bilaterally. No masses are appreciated in the testicles. Left and right epididymis are normal. Rectal: Patient with  normal sphincter tone. Anus and  perineum without scarring or rashes. No rectal masses are appreciated. Prostate is approximately 50 grams, no nodules are appreciated. Seminal vesicles are normal. Skin: No rashes, bruises or suspicious lesions. Lymph: No cervical or inguinal adenopathy. Neurologic: Grossly intact, no focal deficits, moving all 4 extremities. Psychiatric: Normal mood and affect.    Laboratory Data:  Lab Results  Component Value Date   WBC 5.7 07/05/2016   HGB 12.5 (L) 07/05/2016   HCT 36.2 (L) 07/05/2016   MCV 86.6 07/05/2016   PLT 158 07/05/2016   Previous PSA's:     3.0 ng/mL on 03/20/2014  I have reviewed the labs   Assessment & Plan:    1. BPH with LUTS IPSS score is 4/3,  it is improving  Continue conservative management, avoiding bladder irritants and timed voiding's Restart tamsulosin 0.4 mg daily; script sent to pharmacy Continue finasteride 5 mg daily RTC in 12 months for I PSS and exam   2. HTN Patient is transferred to the ED   Return in about 1 year (around 12/16/2018) for IPSS, PVR and exam.  Michiel Cowboy, [facility]  [facility] [address] [place], [place] [address] [phone]

## 2017-12-15 ENCOUNTER — Other Ambulatory Visit: Payer: Self-pay

## 2017-12-15 ENCOUNTER — Telehealth: Payer: Self-pay | Admitting: Cardiovascular Disease

## 2017-12-15 ENCOUNTER — Ambulatory Visit: Payer: Medicare HMO | Admitting: Urology

## 2017-12-15 ENCOUNTER — Telehealth: Payer: Self-pay | Admitting: Urology

## 2017-12-15 ENCOUNTER — Encounter: Payer: Self-pay | Admitting: Urology

## 2017-12-15 ENCOUNTER — Emergency Department
Admission: EM | Admit: 2017-12-15 | Discharge: 2017-12-15 | Disposition: A | Discharge status: Home / Self Care | Payer: Medicare HMO | Attending: Emergency Medicine | Admitting: Emergency Medicine

## 2017-12-15 ENCOUNTER — Encounter: Payer: Self-pay | Admitting: Emergency Medicine

## 2017-12-15 VITALS — BP 206/87 | HR 87 | Temp 98.6°F | Resp 16 | Wt 140.0 lb

## 2017-12-15 DIAGNOSIS — I5032 Chronic diastolic (congestive) heart failure: Secondary | ICD-10-CM | POA: Diagnosis not present

## 2017-12-15 DIAGNOSIS — N401 Enlarged prostate with lower urinary tract symptoms: Secondary | ICD-10-CM

## 2017-12-15 DIAGNOSIS — E119 Type 2 diabetes mellitus without complications: Secondary | ICD-10-CM | POA: Insufficient documentation

## 2017-12-15 DIAGNOSIS — I110 Hypertensive heart disease with heart failure: Secondary | ICD-10-CM | POA: Diagnosis not present

## 2017-12-15 DIAGNOSIS — N138 Other obstructive and reflux uropathy: Secondary | ICD-10-CM | POA: Diagnosis not present

## 2017-12-15 DIAGNOSIS — I10 Essential (primary) hypertension: Secondary | ICD-10-CM

## 2017-12-15 DIAGNOSIS — Z7984 Long term (current) use of oral hypoglycemic drugs: Secondary | ICD-10-CM | POA: Diagnosis not present

## 2017-12-15 DIAGNOSIS — F17210 Nicotine dependence, cigarettes, uncomplicated: Secondary | ICD-10-CM | POA: Diagnosis not present

## 2017-12-15 DIAGNOSIS — R030 Elevated blood-pressure reading, without diagnosis of hypertension: Secondary | ICD-10-CM | POA: Diagnosis present

## 2017-12-15 MED ORDER — CARVEDILOL 12.5 MG PO TABS
12.5000 mg | ORAL_TABLET | Freq: Once | ORAL | Status: AC
  Administered 2017-12-15: 12.5 mg via ORAL
  Filled 2017-12-15: qty 1

## 2017-12-15 MED ORDER — TAMSULOSIN HCL 0.4 MG PO CAPS: 0.4000 mg | ORAL_CAPSULE | Freq: Every day | ORAL | 3 refills | Status: DC

## 2017-12-15 MED ORDER — CARVEDILOL 12.5 MG PO TABS: 12.5000 mg | ORAL_TABLET | Freq: Two times a day (BID) | ORAL | 0 refills | Status: DC

## 2017-12-15 MED ORDER — FINASTERIDE 5 MG PO TABS: 5.0000 mg | ORAL_TABLET | Freq: Every day | ORAL | 4 refills | Status: DC

## 2017-12-15 NOTE — ED Triage Notes (Signed)
Pt presents to ED via wheelchair with c/o hypertension. Pt states "I thought everything was fine before this appt". Per [facility] pt's BP 209/98 in office. Pt denies CP, SOB. Pt is alert and oriented at this time.NAD noted at this time, pt is neurologically intact.

## 2017-12-15 NOTE — ED Triage Notes (Signed)
First Nurse Note:  [facility] for [facility] evaluation of elevated BP.  States BP 209/98 in office.  Patient is AAOx3.  Skin warm and dry. NAD

## 2017-12-15 NOTE — Telephone Encounter (Signed)
I called the number but the office is closed.

## 2017-12-15 NOTE — ED Provider Notes (Signed)
[facility] Emergency Department Provider Note ____________________________________________   First MD Initiated Contact with Patient 12/15/17 1724     (approximate)  I have reviewed the triage vital signs and the nursing notes.   HISTORY  Chief Complaint Hypertension  HPI Gary Lynn is a 84 y.o. male with a history of CAD, diabetes as well as hypertension who is presenting to the emergency department today with a symptom medic hypertension.  He says that he was at the urologist office for his BPH when he was found to have a blood pressure in the 200s over 100s.  He denies any headache, shortness of breath or chest pain.  He was sent to the emergency department for further evaluation and treatment.  Patient and family state that his blood pressure is in the 160s to 170s at baseline.  Past Medical History:  Diagnosis Date  . Carotid artery stenosis    a. carotid doppler 05/2016: RICA 40-59%, LICA 1-39%  . Chronic diastolic CHF (congestive heart failure) (HCC)    a. echo 05/2016: EF 55-60%, AK of the basal-midinferolateral and inferior myocardium (c/w prior study), GR1DD, aortic sclerosis without stenosis, mild MR, PASP 40 mmHg  . Coronary artery disease    Cardiac cath in February of 2013: EF 60%, high-grade chronic disease in OM 1, occluded mid RCA at the site of a previously placed stent with left-to-right collaterals, occluded left SFA.  . Diabetes mellitus with complication (HCC)   . Hypercholesterolemia   . Hypertension   . PAF (paroxysmal atrial fibrillation) (HCC)    a. Zio monitor 05/2016: predominant rhyhtm of sinus with intermittent episodes of Afib/flutter with the longest episode being 6 hours and 51 minutes with an average heart rate of 114 bpm. Occasional PACs and PVCs. b. CHADS2VASc --> 6 (CHF, HTN, age x 2, DM, vascular disease)    Patient Active Problem List   Diagnosis Date Noted  . PAF (paroxysmal atrial fibrillation) (HCC)   .  Chronic diastolic CHF (congestive heart failure) (HCC)   . 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 Other specified causes of urethral stricture 01/16/2015  . Nocturia 01/16/2015  . Essential hypertension 01/13/2015  . PVC's (premature ventricular contractions) 03/19/2013  . Carotid artery stenosis   . Coronary artery disease     Past Surgical History:  Procedure Laterality Date  . BACK SURGERY     years ago  . CARDIAC CATHETERIZATION  07/2007   [facility]  . CARDIAC CATHETERIZATION  07/2011   [facility]  . CORONARY ANGIOPLASTY WITH STENT PLACEMENT      Prior to Admission medications   Medication Sig Start Date End Date Taking? Authorizing Provider  atorvastatin (LIPITOR) 40 MG tablet Take 40 mg by mouth daily.    [provider]  finasteride (PROSCAR) 5 MG tablet Take 1 tablet (5 mg total) by mouth daily. 12/15/17   Michiel Cowboy A, PA-C  fish oil-omega-3 fatty acids 1000 MG capsule Take 2 g by mouth daily.    [provider]  glipiZIDE (GLUCOTROL XL) 5 MG 24 hr tablet Take 5 mg by mouth daily with breakfast.  12/02/14   [provider]  IRON PO Take 325 mg by mouth daily.     [provider]  linagliptin (TRADJENTA) 5 MG TABS tablet Take 5 mg by mouth daily.    [provider]  lisinopril (PRINIVIL,ZESTRIL) 10 MG tablet Take 10 mg by mouth 2 (two) times daily. 2 tablets BID 09/16/17   [provider]  magnesium oxide (MAG-OX) 400 MG tablet Take 1 tablet (400 mg total) by mouth daily. 06/18/16   Dunn, Raymon Muttonyan M, PA-C  metFORMIN (GLUCOPHAGE) 1000 MG tablet Take 1,000 mg by mouth 2 (two) times daily with a meal.    [provider]  metoprolol tartrate (LOPRESSOR) 25 MG tablet Take 25 mg by mouth 2 (two) times daily.  11/29/17   [provider]  ONETOUCH VERIO test strip  11/04/14   [provider]  ranitidine (ZANTAC) 150 MG tablet Take 1 tablet (150 mg total) by mouth daily. 07/20/16   Sondra Bargesunn, Ryan M,  PA-C  tamsulosin (FLOMAX) 0.4 MG CAPS capsule Take 1 capsule (0.4 mg total) by mouth daily. 12/15/17   McGowan, Wellington HampshireShannon A, PA-C  XARELTO 20 MG TABS tablet  11/29/17   [provider]    Allergies Patient has no known allergies.  Family History  Problem Relation Age of Onset  . Dementia Mother   . Heart attack Father   . Kidney disease Neg Hx   . Prostate cancer Neg Hx     Social History Social History   Tobacco Use  . Smoking status: Current Every Day Smoker    Packs/day: 0.50    Years: 50.00    Pack years: 25.00    Types: Cigarettes  . Smokeless tobacco: Never Used  . Tobacco comment: 1 pack per 3days  Substance Use Topics  . Alcohol use: No    Alcohol/week: 0.0 oz  . Drug use: No    Review of Systems  Constitutional: No fever/chills Eyes: No visual changes. ENT: No sore throat. Cardiovascular: Denies chest pain. Respiratory: Denies shortness of breath. Gastrointestinal: No abdominal pain.  No nausea, no vomiting.  No diarrhea.  No constipation. Genitourinary: Negative for dysuria. Musculoskeletal: Negative for back pain. Skin: Negative for rash. Neurological: Negative for headaches, focal weakness or numbness.   ____________________________________________   PHYSICAL EXAM:  VITAL SIGNS: ED Triage Vitals  Enc Vitals Group     BP 12/15/17 1717 (!) 178/92     Pulse Rate 12/15/17 1717 81     Resp 12/15/17 1717 18     Temp 12/15/17 1717 98.9 F (37.2 C)     Temp Source 12/15/17 1717 Oral     SpO2 12/15/17 1717 98 %     Weight 12/15/17 1716 140 lb (63.5 kg)     Height 12/15/17 1716 5\' 3"  (1.6 m)     Head Circumference --      Peak Flow --      Pain Score 12/15/17 1716 0     Pain Loc --      Pain Edu? --      Excl. in GC? --     Constitutional: Alert and oriented. Well appearing and in no acute distress. Eyes: Conjunctivae are normal.  Head: Atraumatic. Nose: No congestion/rhinnorhea. Mouth/Throat: Mucous membranes are moist.  Neck: No  stridor.   Cardiovascular: Normal rate, regular rhythm. Grossly normal heart sounds.  Respiratory: Normal respiratory effort.  No retractions. Lungs CTAB. Gastrointestinal: Soft and nontender. No distention.  Musculoskeletal: No lower extremity tenderness nor edema.  No joint effusions. Neurologic:  Normal speech and language. No gross focal neurologic deficits are appreciated. Skin:  Skin is warm, dry and intact. No rash noted. Psychiatric: Mood and affect are normal. Speech and behavior are normal.  ____________________________________________   LABS (all labs ordered are listed, but only abnormal results are displayed)  Labs Reviewed - No data to display ____________________________________________  EKG   ____________________________________________  RADIOLOGY   ____________________________________________   PROCEDURES  Procedure(s) performed:   Procedures  Critical Care performed:   ____________________________________________   INITIAL IMPRESSION / ASSESSMENT AND PLAN / ED COURSE  Pertinent labs & imaging results that were available during my care of the patient were reviewed by me and considered in my medical decision making (see chart for details).  DDX: Asymptomatic hypertension As part of my medical decision making, I reviewed the following data within the electronic MEDICAL RECORD NUMBERNotes from outpatient visit earlier today.  Reviewed note where staff try to get in touch with Dr.Arida but were unable to.  ----------------------------------------- 7:17 PM on 12/15/2017 -----------------------------------------  Discussed the case with Dr. Elberta Fortis of the [facility] cardiology service.  We discussed possible medication adjustments.  We will be discontinuing the patient's metoprolol and adding Coreg 12.5 mg twice daily.  At this time the patient's blood pressure appears to be at 192/80.  In the room during my exam it was in the 200s over 150s on the bedside monitor.   Patient remains asymptomatic.  Patient says that he would rather follow-up with his primary care doctor regarding his blood pressure.  I think this is acceptable.  They understand they will be stopping the metoprolol and starting now on the Coreg. ____________________________________________   FINAL CLINICAL IMPRESSION(S) / ED DIAGNOSES  Asymptomatic hypertension.    NEW MEDICATIONS STARTED DURING THIS VISIT:  New Prescriptions   No medications on file     Note:  This document was prepared using Dragon voice recognition software and may include unintentional dictation errors.     Myrna Blazer, MD 12/15/17 Norberta Keens

## 2017-12-15 NOTE — Telephone Encounter (Signed)
[facility] States that Gary CowWynona CanesboyShannon Lynn, Gary Lynn wants to discuss patients elevated BP At his visit today with her it was 209/93 Please call to discuss at [phone] and ask for [facility]

## 2017-12-15 NOTE — Telephone Encounter (Signed)
Please call Mr. Anastasia FiedlerHelms and have him schedule a one year follow up.

## 2017-12-16 NOTE — Telephone Encounter (Signed)
Spoke with provider to follow up on call yesterday. She states that patient was sent to ED for his elevated blood pressures. No further needs at this time.

## 2017-12-18 ENCOUNTER — Encounter (HOSPITAL_COMMUNITY): Payer: Self-pay | Admitting: Radiology

## 2017-12-18 ENCOUNTER — Emergency Department (HOSPITAL_COMMUNITY): Admission: EM | Admit: 2017-12-18 | Payer: Medicare HMO | Source: Home / Self Care

## 2017-12-18 ENCOUNTER — Other Ambulatory Visit: Payer: Self-pay

## 2017-12-18 ENCOUNTER — Emergency Department (HOSPITAL_COMMUNITY): Payer: Medicare HMO

## 2017-12-18 ENCOUNTER — Observation Stay (HOSPITAL_COMMUNITY)
Admission: EM | Admit: 2017-12-18 | Discharge: 2017-12-19 | Disposition: A | Discharge status: Home Health | Payer: Medicare HMO | Attending: Family Medicine | Admitting: Family Medicine

## 2017-12-18 DIAGNOSIS — G459 Transient cerebral ischemic attack, unspecified: Secondary | ICD-10-CM | POA: Diagnosis not present

## 2017-12-18 DIAGNOSIS — E785 Hyperlipidemia, unspecified: Secondary | ICD-10-CM | POA: Insufficient documentation

## 2017-12-18 DIAGNOSIS — E7800 Pure hypercholesterolemia, unspecified: Secondary | ICD-10-CM | POA: Insufficient documentation

## 2017-12-18 DIAGNOSIS — Z8249 Family history of ischemic heart disease and other diseases of the circulatory system: Secondary | ICD-10-CM | POA: Diagnosis not present

## 2017-12-18 DIAGNOSIS — I110 Hypertensive heart disease with heart failure: Secondary | ICD-10-CM | POA: Diagnosis not present

## 2017-12-18 DIAGNOSIS — Z79899 Other long term (current) drug therapy: Secondary | ICD-10-CM | POA: Diagnosis not present

## 2017-12-18 DIAGNOSIS — Z7984 Long term (current) use of oral hypoglycemic drugs: Secondary | ICD-10-CM | POA: Diagnosis not present

## 2017-12-18 DIAGNOSIS — Z7901 Long term (current) use of anticoagulants: Secondary | ICD-10-CM | POA: Insufficient documentation

## 2017-12-18 DIAGNOSIS — I2510 Atherosclerotic heart disease of native coronary artery without angina pectoris: Secondary | ICD-10-CM | POA: Insufficient documentation

## 2017-12-18 DIAGNOSIS — E119 Type 2 diabetes mellitus without complications: Secondary | ICD-10-CM | POA: Insufficient documentation

## 2017-12-18 DIAGNOSIS — Z955 Presence of coronary angioplasty implant and graft: Secondary | ICD-10-CM | POA: Diagnosis not present

## 2017-12-18 DIAGNOSIS — F17210 Nicotine dependence, cigarettes, uncomplicated: Secondary | ICD-10-CM

## 2017-12-18 DIAGNOSIS — N400 Enlarged prostate without lower urinary tract symptoms: Secondary | ICD-10-CM | POA: Diagnosis not present

## 2017-12-18 DIAGNOSIS — R29810 Facial weakness: Secondary | ICD-10-CM | POA: Diagnosis not present

## 2017-12-18 DIAGNOSIS — J439 Emphysema, unspecified: Secondary | ICD-10-CM | POA: Insufficient documentation

## 2017-12-18 DIAGNOSIS — I6523 Occlusion and stenosis of bilateral carotid arteries: Secondary | ICD-10-CM

## 2017-12-18 DIAGNOSIS — I739 Peripheral vascular disease, unspecified: Secondary | ICD-10-CM | POA: Diagnosis not present

## 2017-12-18 DIAGNOSIS — Z820 Family history of epilepsy and other diseases of the nervous system: Secondary | ICD-10-CM | POA: Insufficient documentation

## 2017-12-18 DIAGNOSIS — I452 Bifascicular block: Secondary | ICD-10-CM | POA: Diagnosis not present

## 2017-12-18 DIAGNOSIS — I5032 Chronic diastolic (congestive) heart failure: Secondary | ICD-10-CM | POA: Insufficient documentation

## 2017-12-18 DIAGNOSIS — I639 Cerebral infarction, unspecified: Secondary | ICD-10-CM

## 2017-12-18 DIAGNOSIS — Z7902 Long term (current) use of antithrombotics/antiplatelets: Secondary | ICD-10-CM | POA: Diagnosis not present

## 2017-12-18 DIAGNOSIS — I480 Paroxysmal atrial fibrillation: Secondary | ICD-10-CM | POA: Insufficient documentation

## 2017-12-18 LAB — DIFFERENTIAL
ABS IMMATURE GRANULOCYTES: 0.1 10*3/uL (ref 0.0–0.1)
BASOS PCT: 1 %
Basophils Absolute: 0.1 10*3/uL (ref 0.0–0.1)
EOS ABS: 0.1 10*3/uL (ref 0.0–0.7)
Eosinophils Relative: 2 %
Immature Granulocytes: 1 %
LYMPHS ABS: 1.4 10*3/uL (ref 0.7–4.0)
Lymphocytes Relative: 17 %
MONO ABS: 0.6 10*3/uL (ref 0.1–1.0)
MONOS PCT: 8 %
NEUTROS ABS: 5.9 10*3/uL (ref 1.7–7.7)
Neutrophils Relative %: 71 %

## 2017-12-18 LAB — CBC
HEMATOCRIT: 37.4 % — AB (ref 39.0–52.0)
Hemoglobin: 12.3 g/dL — ABNORMAL LOW (ref 13.0–17.0)
MCH: 29.6 pg (ref 26.0–34.0)
MCHC: 32.9 g/dL (ref 30.0–36.0)
MCV: 89.9 fL (ref 78.0–100.0)
PLATELETS: 169 10*3/uL (ref 150–400)
RBC: 4.16 MIL/uL — ABNORMAL LOW (ref 4.22–5.81)
RDW: 14 % (ref 11.5–15.5)
WBC: 8.2 10*3/uL (ref 4.0–10.5)

## 2017-12-18 LAB — I-STAT CHEM 8, ED
BUN: 18 mg/dL (ref 8–23)
CALCIUM ION: 1.07 mmol/L — AB (ref 1.15–1.40)
Chloride: 93 mmol/L — ABNORMAL LOW (ref 98–111)
Creatinine, Ser: 1.2 mg/dL (ref 0.61–1.24)
GLUCOSE: 231 mg/dL — AB (ref 70–99)
HCT: 37 % — ABNORMAL LOW (ref 39.0–52.0)
HEMOGLOBIN: 12.6 g/dL — AB (ref 13.0–17.0)
Potassium: 4.3 mmol/L (ref 3.5–5.1)
Sodium: 133 mmol/L — ABNORMAL LOW (ref 135–145)
TCO2: 26 mmol/L (ref 22–32)

## 2017-12-18 LAB — COMPREHENSIVE METABOLIC PANEL
ALT: 10 U/L (ref 0–44)
AST: 14 U/L — AB (ref 15–41)
Albumin: 3.1 g/dL — ABNORMAL LOW (ref 3.5–5.0)
Alkaline Phosphatase: 54 U/L (ref 38–126)
Anion gap: 12 (ref 5–15)
BILIRUBIN TOTAL: 0.6 mg/dL (ref 0.3–1.2)
BUN: 17 mg/dL (ref 8–23)
CHLORIDE: 95 mmol/L — AB (ref 98–111)
CO2: 26 mmol/L (ref 22–32)
Calcium: 8.8 mg/dL — ABNORMAL LOW (ref 8.9–10.3)
Creatinine, Ser: 1.29 mg/dL — ABNORMAL HIGH (ref 0.61–1.24)
GFR, EST AFRICAN AMERICAN: 57 mL/min — AB (ref >60)
GFR, EST NON AFRICAN AMERICAN: 49 mL/min — AB (ref >60)
Glucose, Bld: 235 mg/dL — ABNORMAL HIGH (ref 70–99)
Potassium: 4.3 mmol/L (ref 3.5–5.1)
Sodium: 133 mmol/L — ABNORMAL LOW (ref 135–145)
TOTAL PROTEIN: 6.5 g/dL (ref 6.5–8.1)

## 2017-12-18 LAB — GLUCOSE, CAPILLARY
Glucose-Capillary: 137 mg/dL — ABNORMAL HIGH (ref 70–99)
Glucose-Capillary: 140 mg/dL — ABNORMAL HIGH (ref 70–99)

## 2017-12-18 LAB — LIPID PANEL
CHOL/HDL RATIO: 5.3 ratio
Cholesterol: 163 mg/dL (ref 0–200)
HDL: 31 mg/dL — ABNORMAL LOW (ref >40)
LDL CALC: 100 mg/dL — AB (ref 0–99)
Triglycerides: 158 mg/dL — ABNORMAL HIGH (ref <150)
VLDL: 32 mg/dL (ref 0–40)

## 2017-12-18 LAB — PROTIME-INR
INR: 0.96
Prothrombin Time: 12.7 seconds (ref 11.4–15.2)

## 2017-12-18 LAB — VITAMIN B12: Vitamin B-12: 156 pg/mL — ABNORMAL LOW (ref 180–914)

## 2017-12-18 LAB — MAGNESIUM: MAGNESIUM: 1.5 mg/dL — AB (ref 1.7–2.4)

## 2017-12-18 LAB — APTT: aPTT: 27 seconds (ref 24–36)

## 2017-12-18 LAB — FOLATE: FOLATE: 13.1 ng/mL (ref >5.9)

## 2017-12-18 LAB — I-STAT TROPONIN, ED: TROPONIN I, POC: 0.01 ng/mL (ref 0.00–0.08)

## 2017-12-18 LAB — TSH: TSH: 3.169 u[IU]/mL (ref 0.350–4.500)

## 2017-12-18 LAB — HEMOGLOBIN A1C
Hgb A1c MFr Bld: 7.1 % — ABNORMAL HIGH (ref 4.8–5.6)
Mean Plasma Glucose: 157.07 mg/dL

## 2017-12-18 LAB — CBG MONITORING, ED: Glucose-Capillary: 222 mg/dL — ABNORMAL HIGH (ref 70–99)

## 2017-12-18 MED ORDER — CARVEDILOL 12.5 MG PO TABS: 12.5000 mg | ORAL_TABLET | Freq: Two times a day (BID) | ORAL | Status: DC

## 2017-12-18 MED ORDER — POLYETHYLENE GLYCOL 3350 17 G PO PACK: 17.0000 g | PACK | Freq: Every day | ORAL | Status: DC | PRN

## 2017-12-18 MED ORDER — NICOTINE 14 MG/24HR TD PT24
14.0000 mg | MEDICATED_PATCH | Freq: Every day | TRANSDERMAL | Status: DC
  Filled 2017-12-18: qty 1

## 2017-12-18 MED ORDER — ACETAMINOPHEN 650 MG RE SUPP
650.0000 mg | Freq: Four times a day (QID) | RECTAL | Status: DC | PRN
Start: 2017-12-18 — End: 2017-12-19

## 2017-12-18 MED ORDER — FERROUS SULFATE 325 (65 FE) MG PO TABS
325.0000 mg | ORAL_TABLET | Freq: Every day | ORAL | Status: DC
  Administered 2017-12-18 – 2017-12-19 (×2): 325 mg via ORAL
  Filled 2017-12-18 (×2): qty 1

## 2017-12-18 MED ORDER — FAMOTIDINE 20 MG PO TABS
20.0000 mg | ORAL_TABLET | Freq: Every day | ORAL | Status: DC
  Administered 2017-12-19: 20 mg via ORAL
  Filled 2017-12-18: qty 1

## 2017-12-18 MED ORDER — RIVAROXABAN 20 MG PO TABS
20.0000 mg | ORAL_TABLET | Freq: Every day | ORAL | Status: DC
  Administered 2017-12-18: 20 mg via ORAL
  Filled 2017-12-18: qty 1

## 2017-12-18 MED ORDER — ATORVASTATIN CALCIUM 80 MG PO TABS
80.0000 mg | ORAL_TABLET | Freq: Every day | ORAL | Status: DC
  Administered 2017-12-18: 80 mg via ORAL
  Filled 2017-12-18: qty 1

## 2017-12-18 MED ORDER — CLOPIDOGREL BISULFATE 75 MG PO TABS
75.0000 mg | ORAL_TABLET | Freq: Every day | ORAL | Status: DC
  Administered 2017-12-18 – 2017-12-19 (×2): 75 mg via ORAL
  Filled 2017-12-18 (×2): qty 1

## 2017-12-18 MED ORDER — INSULIN ASPART 100 UNIT/ML ~~LOC~~ SOLN
0.0000 [IU] | Freq: Three times a day (TID) | SUBCUTANEOUS | Status: DC
  Administered 2017-12-19: 2 [IU] via SUBCUTANEOUS
  Administered 2017-12-19: 3 [IU] via SUBCUTANEOUS

## 2017-12-18 MED ORDER — ACETAMINOPHEN 325 MG PO TABS: 650.0000 mg | ORAL_TABLET | Freq: Four times a day (QID) | ORAL | Status: DC | PRN

## 2017-12-18 MED ORDER — LISINOPRIL 20 MG PO TABS: 20.0000 mg | ORAL_TABLET | Freq: Two times a day (BID) | ORAL | Status: DC

## 2017-12-18 MED ORDER — ONDANSETRON HCL 4 MG PO TABS: 4.0000 mg | ORAL_TABLET | Freq: Four times a day (QID) | ORAL | Status: DC | PRN

## 2017-12-18 MED ORDER — ONDANSETRON HCL 4 MG/2ML IJ SOLN: 4.0000 mg | Freq: Four times a day (QID) | INTRAMUSCULAR | Status: DC | PRN

## 2017-12-18 MED ORDER — ASPIRIN EC 81 MG PO TBEC
81.0000 mg | DELAYED_RELEASE_TABLET | Freq: Every day | ORAL | Status: DC
  Administered 2017-12-18: 81 mg via ORAL
  Filled 2017-12-18: qty 1

## 2017-12-18 MED ORDER — IOPAMIDOL (ISOVUE-370) INJECTION 76%
50.0000 mL | Freq: Once | INTRAVENOUS | Status: AC | PRN
  Administered 2017-12-18: 50 mL via INTRAVENOUS

## 2017-12-18 MED ORDER — FINASTERIDE 5 MG PO TABS
5.0000 mg | ORAL_TABLET | Freq: Every day | ORAL | Status: DC
  Administered 2017-12-19: 5 mg via ORAL
  Filled 2017-12-18: qty 1

## 2017-12-18 MED ORDER — TAMSULOSIN HCL 0.4 MG PO CAPS
0.4000 mg | ORAL_CAPSULE | Freq: Every day | ORAL | Status: DC
  Administered 2017-12-18: 0.4 mg via ORAL
  Filled 2017-12-18: qty 1

## 2017-12-18 NOTE — ED Notes (Signed)
Pharmacy notified of unable to get meds out of pyxis due to grayed-out.

## 2017-12-18 NOTE — ED Notes (Signed)
Pt returned from MRI °

## 2017-12-18 NOTE — ED Provider Notes (Signed)
[facility] Provider Note   CSN: [id number] Arrival date & time: 12/18/17  1416   An emergency department physician performed an initial assessment on this suspected stroke patient at [id number].  History   Chief Complaint Chief Complaint  Patient presents with  . Code Stroke    HPI Gary Lynn is a 84 y.o. male.  HPI Patient is wife reports he went to sit on the porch as he usually does every day.  She had asked him he want a sandwich and he did not.  She was inside making a sandwich and later went back to ask him again.  He had gone out at about 1130 and it was noon when she went back to him for second time.  She reports he seemed like he was asleep.  He did not respond to voice.  She shook him.  He started to come around some but did not seem to recognize her and seemed very groggy.  She called her daughter who came and did a FAST stroke check.  She reports his left face was drooping and he could not hold his left arm up.  He called EMS.  Things were still present upon EMS arrival but his daughter reports they were improving by the time they were leaving her house.  Patient reports that he has some recall about his wife trying to awaken him.  Now at the time of my evaluation, he no longer endorses weakness of the left upper or lower extremity. Past Medical History:  Diagnosis Date  . Carotid artery stenosis    a. carotid doppler 05/2016: RICA 40-59%, LICA 1-39%  . Chronic diastolic CHF (congestive heart failure) (HCC)    a. echo 05/2016: EF 55-60%, AK of the basal-midinferolateral and inferior myocardium (c/w prior study), GR1DD, aortic sclerosis without stenosis, mild MR, PASP 40 mmHg  . Coronary artery disease    Cardiac cath in February of 2013: EF 60%, high-grade chronic disease in OM 1, occluded mid RCA at the site of a previously placed stent with left-to-right collaterals, occluded left SFA.  . Diabetes mellitus with complication (HCC)   .  Hypercholesterolemia   . Hypertension   . PAF (paroxysmal atrial fibrillation) (HCC)    a. Zio monitor 05/2016: predominant rhyhtm of sinus with intermittent episodes of Afib/flutter with the longest episode being 6 hours and 51 minutes with an average heart rate of 114 bpm. Occasional PACs and PVCs. b. CHADS2VASc --> 6 (CHF, HTN, age x 2, DM, vascular disease)    Patient Active Problem List   Diagnosis Date Noted  . PAF (paroxysmal atrial fibrillation) (HCC)   . Chronic diastolic CHF (congestive heart failure) (HCC)   . 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 Other specified causes of urethral stricture 01/16/2015  . Nocturia 01/16/2015  . Essential hypertension 01/13/2015  . PVC's (premature ventricular contractions) 03/19/2013  . Carotid artery stenosis   . Coronary artery disease     Past Surgical History:  Procedure Laterality Date  . BACK SURGERY     years ago  . CARDIAC CATHETERIZATION  07/2007   [facility]  . CARDIAC CATHETERIZATION  07/2011   [facility]  . CORONARY ANGIOPLASTY WITH STENT PLACEMENT          Home Medications    Prior to Admission medications   Medication Sig Start Date End Date Taking? Authorizing Provider  atorvastatin (LIPITOR) 40 MG tablet Take 40 mg by mouth daily.    [provider]  carvedilol (COREG) 12.5 MG tablet Take 1 tablet (12.5 mg total) by mouth 2 (two) times daily. 12/15/17 12/15/18  Myrna Blazer, MD  finasteride (PROSCAR) 5 MG tablet Take 1 tablet (5 mg total) by mouth daily. 12/15/17   Michiel Cowboy A, PA-C  fish oil-omega-3 fatty acids 1000 MG capsule Take 2 g by mouth daily.    [provider]  glipiZIDE (GLUCOTROL XL) 5 MG 24 hr tablet Take 5 mg by mouth daily with breakfast.  12/02/14   [provider]  IRON PO Take 325 mg by mouth daily.     [provider]  linagliptin (TRADJENTA) 5 MG TABS tablet Take 5 mg by mouth daily.    [provider]  lisinopril  (PRINIVIL,ZESTRIL) 10 MG tablet Take 10 mg by mouth 2 (two) times daily. 2 tablets BID 09/16/17   [provider]  magnesium oxide (MAG-OX) 400 MG tablet Take 1 tablet (400 mg total) by mouth daily. 06/18/16   Dunn, Raymon Mutton, PA-C  metFORMIN (GLUCOPHAGE) 1000 MG tablet Take 1,000 mg by mouth 2 (two) times daily with a meal.    [provider]  metoprolol tartrate (LOPRESSOR) 25 MG tablet Take 25 mg by mouth 2 (two) times daily.  11/29/17   [provider]  ONETOUCH VERIO test strip  11/04/14   [provider]  ranitidine (ZANTAC) 150 MG tablet Take 1 tablet (150 mg total) by mouth daily. 07/20/16   Sondra Barges, PA-C  tamsulosin (FLOMAX) 0.4 MG CAPS capsule Take 1 capsule (0.4 mg total) by mouth daily. 12/15/17   McGowan, Wellington Hampshire, PA-C  XARELTO 20 MG TABS tablet  11/29/17   [provider]    Family History Family History  Problem Relation Age of Onset  . Dementia Mother   . Heart attack Father   . Kidney disease Neg Hx   . Prostate cancer Neg Hx     Social History Social History   Tobacco Use  . Smoking status: Current Every Day Smoker    Packs/day: 0.50    Years: 50.00    Pack years: 25.00    Types: Cigarettes  . Smokeless tobacco: Never Used  . Tobacco comment: 1 pack per 3days  Substance Use Topics  . Alcohol use: No    Alcohol/week: 0.0 oz  . Drug use: No     Allergies   Patient has no known allergies.   Review of Systems Review of Systems 10 Systems reviewed and are negative for acute change except as noted in the HPI.   Physical Exam Updated Vital Signs BP (!) 152/74   Pulse 67   Temp 98.9 F (37.2 C) (Oral)   Resp 17   Wt 64.1 kg (141 lb 5 oz)   SpO2 98%   BMI 25.03 kg/m   Physical Exam  Constitutional: He is oriented to person, place, and time. He appears well-developed and well-nourished.  HENT:  Head: Normocephalic and atraumatic.  Mouth/Throat: Oropharynx is clear and moist.  Eyes: Pupils are equal,  round, and reactive to light. EOM are normal.  Neck: Neck supple.  Cardiovascular: Normal rate, regular rhythm, normal heart sounds and intact distal pulses.  Pulmonary/Chest: Effort normal and breath sounds normal.  Abdominal: Soft. Bowel sounds are normal. He exhibits no distension. There is no tenderness.  Musculoskeletal: Normal range of motion. He exhibits no edema or tenderness.  Neurological: He is alert and oriented to person, place, and time. He has normal strength. A  cranial nerve deficit is present. He exhibits normal muscle tone. Coordination normal. GCS eye subscore is 4. GCS verbal subscore is 5. GCS motor subscore is 6.  Patient is awake and appropriate.  He does seem to have residual left mouth droop.  Airway widely patent.  Tongue midline.  Grip strength bilateral upper extremities 5\5.  Patient can elevate and hold both lower extremities off of the bed without difficulty.  Skin: Skin is warm, dry and intact.  Psychiatric: He has a normal mood and affect.     ED Treatments / Results  Labs (all labs ordered are listed, but only abnormal results are displayed) Labs Reviewed  CBC - Abnormal; Notable for the following components:      Result Value   RBC 4.16 (*)    Hemoglobin 12.3 (*)    HCT 37.4 (*)    All other components within normal limits  COMPREHENSIVE METABOLIC PANEL - Abnormal; Notable for the following components:   Sodium 133 (*)    Chloride 95 (*)    Glucose, Bld 235 (*)    Creatinine, Ser 1.29 (*)    Calcium 8.8 (*)    Albumin 3.1 (*)    AST 14 (*)    GFR calc non Af Amer 49 (*)    GFR calc Af Amer 57 (*)    All other components within normal limits  CBG MONITORING, ED - Abnormal; Notable for the following components:   Glucose-Capillary 222 (*)    All other components within normal limits  I-STAT CHEM 8, ED - Abnormal; Notable for the following components:   Sodium 133 (*)    Chloride 93 (*)    Glucose, Bld 231 (*)    Calcium, Ion 1.07 (*)     Hemoglobin 12.6 (*)    HCT 37.0 (*)    All other components within normal limits  PROTIME-INR  APTT  DIFFERENTIAL  I-STAT TROPONIN, ED    EKG EKG Interpretation  Date/Time:  Sunday December 18 2017 15:25:59 EDT Ventricular Rate:  68 PR Interval:    QRS Duration: 145 QT Interval:  462 QTC Calculation: 492 R Axis:   -73 Text Interpretation:  Sinus rhythm Prolonged PR interval Right bundle branch block LVH with IVCD and secondary repol abnrm Borderline prolonged QT interval old RBBB. old nonspecific inferior ST . appears sinus. need repeat for baseline artifact. Confirmed by Arby Barrette [phone]) on 12/18/2017 3:36:41 PM   Radiology Ct Angio Head W Or Wo Contrast  Result Date: 12/18/2017 CLINICAL DATA:  Left facial droop EXAM: CT ANGIOGRAPHY HEAD AND NECK TECHNIQUE: Multidetector CT imaging of the head and neck was performed using the standard protocol during bolus administration of intravenous contrast. Multiplanar CT image reconstructions and MIPs were obtained to evaluate the vascular anatomy. Carotid stenosis measurements (when applicable) are obtained utilizing NASCET criteria, using the distal internal carotid diameter as the denominator. CONTRAST:  Reference EMR COMPARISON:  None. FINDINGS: CTA NECK FINDINGS Aortic arch: Atherosclerotic calcification.  No acute finding. Right carotid system: Atheromatous plaque at the brachiocephalic and common carotid. There is irregular mixed density plaque at the right ICA bulb with at least 75% stenosis. Left carotid system: Extensive mainly calcified plaque at the proximal internal carotid with stenosis measuring up to 50%. No ulceration or dissection. Vertebral arteries: Proximal subclavian atherosclerosis without flow limiting stenosis. Atheromatous left vertebral artery with occasional extrinsic narrowing from hypertrophic facets. Diminutive congenitally small right vertebral artery with stenosis at the origin, effectively occluded by the dura.  Skeleton:  Diffuse degenerative disease without acute finding. Other neck: No acute finding. Upper chest: No acute finding.  Emphysema. Review of the MIP images confirms the above findings CTA HEAD FINDINGS Anterior circulation: Atherosclerotic plaque along the carotid siphons without suspected flow limiting stenosis. No large vessel occlusion or flow limiting stenosis. Right M4 branch calcification (correlating with calcification noted on prior noncontrast CT) would be an atypical location for atheromatous calcification and is most likely calcified embolus-chronicity uncertain. Negative for aneurysm. Posterior circulation: Left vertebral and basilar arteries are smooth and diffusely patent. Fetal type left PCA. Robust flow in bilateral posterior cerebral arteries. Venous sinuses: Limited assessment by contrast timing. Anatomic variants: As above Delayed phase: Not obtained in the emergent setting Review of the MIP images confirms the above findings IMPRESSION: 1. Negative for emergent large vessel occlusion. 2. Right inferior division M4 branch calcification is likely a calcified embolus at this location, of indeterminate chronicity. 3. Severe proximal right ICA stenosis. The underlying plaque is irregular and could be an embolic source. 4. 50% atheromatous narrowing at the left proximal ICA. 5. Severe stenosis at the origin of the non dominant right vertebral artery. Right PICA flow is likely retrograde from the basilar. 6.  Emphysema (ICD10-J43.9). Electronically Signed   By: Marnee Spring M.D.   On: 12/18/2017 14:53   Ct Angio Neck W Or Wo Contrast  Result Date: 12/18/2017 CLINICAL DATA:  Left facial droop EXAM: CT ANGIOGRAPHY HEAD AND NECK TECHNIQUE: Multidetector CT imaging of the head and neck was performed using the standard protocol during bolus administration of intravenous contrast. Multiplanar CT image reconstructions and MIPs were obtained to evaluate the vascular anatomy. Carotid stenosis  measurements (when applicable) are obtained utilizing NASCET criteria, using the distal internal carotid diameter as the denominator. CONTRAST:  Reference EMR COMPARISON:  None. FINDINGS: CTA NECK FINDINGS Aortic arch: Atherosclerotic calcification.  No acute finding. Right carotid system: Atheromatous plaque at the brachiocephalic and common carotid. There is irregular mixed density plaque at the right ICA bulb with at least 75% stenosis. Left carotid system: Extensive mainly calcified plaque at the proximal internal carotid with stenosis measuring up to 50%. No ulceration or dissection. Vertebral arteries: Proximal subclavian atherosclerosis without flow limiting stenosis. Atheromatous left vertebral artery with occasional extrinsic narrowing from hypertrophic facets. Diminutive congenitally small right vertebral artery with stenosis at the origin, effectively occluded by the dura. Skeleton: Diffuse degenerative disease without acute finding. Other neck: No acute finding. Upper chest: No acute finding.  Emphysema. Review of the MIP images confirms the above findings CTA HEAD FINDINGS Anterior circulation: Atherosclerotic plaque along the carotid siphons without suspected flow limiting stenosis. No large vessel occlusion or flow limiting stenosis. Right M4 branch calcification (correlating with calcification noted on prior noncontrast CT) would be an atypical location for atheromatous calcification and is most likely calcified embolus-chronicity uncertain. Negative for aneurysm. Posterior circulation: Left vertebral and basilar arteries are smooth and diffusely patent. Fetal type left PCA. Robust flow in bilateral posterior cerebral arteries. Venous sinuses: Limited assessment by contrast timing. Anatomic variants: As above Delayed phase: Not obtained in the emergent setting Review of the MIP images confirms the above findings IMPRESSION: 1. Negative for emergent large vessel occlusion. 2. Right inferior division  M4 branch calcification is likely a calcified embolus at this location, of indeterminate chronicity. 3. Severe proximal right ICA stenosis. The underlying plaque is irregular and could be an embolic source. 4. 50% atheromatous narrowing at the left proximal ICA. 5. Severe stenosis at the origin of  the non dominant right vertebral artery. Right PICA flow is likely retrograde from the basilar. 6.  Emphysema (ICD10-J43.9). Electronically Signed   By: Marnee Spring M.D.   On: 12/18/2017 14:53   Ct Head Code Stroke Wo Contrast  Result Date: 12/18/2017 CLINICAL DATA:  Code stroke.  Left facial droop EXAM: CT HEAD WITHOUT CONTRAST TECHNIQUE: Contiguous axial images were obtained from the base of the skull through the vertex without intravenous contrast. COMPARISON:  None. FINDINGS: Brain: No evidence of acute infarction, hemorrhage, hydrocephalus, extra-axial collection or mass lesion/mass effect. Generalized atrophy. Well-defined remote appearing lateral lenticulostriate infarct on the right extending through putamen and corona radiata. There is a solitary calcification within a right parietal sulcus. Vascular: Atherosclerotic calcification.  No hyperdense vessel. Skull: No acute finding Sinuses/Orbits: Negative Other: These results were communicated to Dr. Amada Jupiter at 2:33 pmon 6/30/2019by text page via the [facility] messaging system. ASPECTS [facility] Stroke Program Early CT Score) - Ganglionic level infarction (caudate, lentiform nuclei, internal capsule, insula, M1-M3 cortex): 7 - Supraganglionic infarction (M4-M6 cortex): 3 Total score (0-10 with 10 being normal): 10 IMPRESSION: 1. Negative for hemorrhage or visible acute infarct.  ASPECTS is 10. 2. Remote appearing lateral lenticulostriate infarct on the right. 3. Low right parietal sulcal calcification, calcific embolus is a consideration in this setting. Electronically Signed   By: Marnee Spring M.D.   On: 12/18/2017 14:36    Procedures Procedures  (including critical care time)  Medications Ordered in ED Medications  aspirin EC tablet 81 mg (81 mg Oral Given 12/18/17 1552)  clopidogrel (PLAVIX) tablet 75 mg (75 mg Oral Given 12/18/17 1550)  atorvastatin (LIPITOR) tablet 80 mg (has no administration in time range)  nicotine (NICODERM CQ - dosed in mg/24 hours) patch 14 mg (has no administration in time range)  iopamidol (ISOVUE-370) 76 % injection 50 mL (50 mLs Intravenous Contrast Given 12/18/17 1444)     Initial Impression / Assessment and Plan / ED Course  I have reviewed the triage vital signs and the nursing notes.  Pertinent labs & imaging results that were available during my care of the patient were reviewed by me and considered in my medical decision making (see chart for details).  Clinical Course as of Dec 19 1551  Sun Dec 18, 2017  1550 Family Medicine resident consulted for admission.   [MP]    Clinical Course User Index [MP] Arby Barrette, MD    Neurology has seen the patient as code stroke.  Final Clinical Impressions(s) / ED Diagnoses   Final diagnoses:  TIA (transient ischemic attack)  Cerebrovascular accident (CVA), unspecified mechanism (HCC)   Patient change in mental status and currently observed left-sided deficits by family members.  This was acute in onset.  Does have significantly improved.  Patient has been seen as code stroke.  At this time his mental status is alert and appropriate.  Does not have ongoing motor deficit.  Currently there is not recommendation from neurology for interventional procedure or thrombolytics.  Plan for admission for stroke evaluation. ED Discharge Orders    None       Arby Barrette, MD 12/18/17 1553

## 2017-12-18 NOTE — ED Notes (Addendum)
Admitting MD at bedside. Transported to MRI.  Wife given glasses and alert necklace.  Transporter notified that pt's shorts needed to be removed prior to transport but states they will remove them in MRI.  Family going to get something to eat.

## 2017-12-18 NOTE — ED Notes (Signed)
Unable to pull meds due at 1530 from pyxis because not verified by pharmacy.  Pharmacy notified.

## 2017-12-18 NOTE — Consult Note (Signed)
Referring Physician: ER/EMS Chief Complaint: Code Stroke  HPI: Gary Lynn is an 84 y.o. male with a history of Carotid stenosis, CAD, HLD, diabetes and HTN who presents to the emergency department today as a pre-activated Code Stroke per EMS due to report of dense left side symptoms. However, upon arrival his symptoms have now resolved. Pt denies any h/a or pain. He tells me he fell asleep on the front porch as he usually does every day. Today when his wife went out to wake him, she could not arouse him. EMS was summoned and they noted dense left side symptoms, which prompted the Code Stroke. He will need to be admitted for stroke/TIA work up.   Date last known well: 12/18/17 Time last known well: ~1130am  tPA Given: no, symptoms have resolved.   Past Medical History Past Medical History:  Diagnosis Date  . Carotid artery stenosis    a. carotid doppler 05/2016: RICA 40-59%, LICA 1-39%  . Chronic diastolic CHF (congestive heart failure) (HCC)    a. echo 05/2016: EF 55-60%, AK of the basal-midinferolateral and inferior myocardium (c/w prior study), GR1DD, aortic sclerosis without stenosis, mild MR, PASP 40 mmHg  . Coronary artery disease    Cardiac cath in February of 2013: EF 60%, high-grade chronic disease in OM 1, occluded mid RCA at the site of a previously placed stent with left-to-right collaterals, occluded left SFA.  . Diabetes mellitus with complication (HCC)   . Hypercholesterolemia   . Hypertension   . PAF (paroxysmal atrial fibrillation) (HCC)    a. Zio monitor 05/2016: predominant rhyhtm of sinus with intermittent episodes of Afib/flutter with the longest episode being 6 hours and 51 minutes with an average heart rate of 114 bpm. Occasional PACs and PVCs. b. CHADS2VASc --> 6 (CHF, HTN, age x 2, DM, vascular disease)    Surgical History Past Surgical History:  Procedure Laterality Date  . BACK SURGERY     years ago  . CARDIAC CATHETERIZATION  07/2007   [facility]  . CARDIAC  CATHETERIZATION  07/2011   [facility]  . CORONARY ANGIOPLASTY WITH STENT PLACEMENT      Family History  Family History  Problem Relation Age of Onset  . Dementia Mother   . Heart attack Father   . Kidney disease Neg Hx   . Prostate cancer Neg Hx     Social History:   reports that he has been smoking cigarettes.  He has a 25.00 pack-year smoking history. He has never used smokeless tobacco. He reports that he does not drink alcohol or use drugs.  Allergies:  No Known Allergies  Home Medications:   (Not in a hospital admission)  Hospital Medications   ROS:  History obtained from pt  General ROS: negative for - chills, fatigue, fever, night sweats, weight gain or weight loss Psychological ROS: negative for - behavioral disorder, hallucinations, memory difficulties, mood swings or suicidal ideation Ophthalmic ROS: negative for - blurry vision, double vision, eye pain or loss of vision ENT ROS: negative for - epistaxis, nasal discharge, oral lesions, sore throat, tinnitus or vertigo Allergy and Immunology ROS: negative for - hives or itchy/watery eyes Hematological and Lymphatic ROS: negative for - bleeding problems, bruising or swollen lymph nodes Endocrine ROS: negative for - galactorrhea, hair pattern changes, polydipsia/polyuria or temperature intolerance Respiratory ROS: negative for - cough, hemoptysis, shortness of breath or wheezing Cardiovascular ROS: negative for - chest pain, dyspnea on exertion, edema or irregular heartbeat Gastrointestinal ROS: negative for - abdominal  pain, diarrhea, hematemesis, nausea/vomiting or stool incontinence Genito-Urinary ROS: negative for - dysuria, hematuria, incontinence or urinary frequency/urgency Musculoskeletal ROS: negative for - joint swelling or muscular weakness Neurological ROS: as noted in HPI Dermatological ROS: negative for rash and skin lesion changes   Physical Examination:  Vitals:   12/18/17 1400 12/18/17 1447  BP:  (!)  155/59  Pulse:  65  Resp:  16  Temp:  98.1 F (36.7 C)  TempSrc:  Oral  SpO2:  97%  Weight: 141 lb 5 oz (64.1 kg)     General - mild distress Heart - Regular rate and rhythm - no murmer appreciated Lungs - Clear to auscultation  Abdomen - Soft - non tender Extremities - Distal pulses intact - no edema Skin - Warm and dry   Neurologic Examination:  Mental Status: Alert, oriented, thought content is slow, but appropriate.  Speech fluent without evidence of aphasia. No anomia. His speech is slow, but no dysarthria. Able to follow 3 step commands without difficulty. Cranial Nerves: II: Discs not visualized; Visual fields grossly normal, pupils equal, round, reactive to light III,IV, VI: ptosis not present, extra-ocular motions intact bilaterally V,VII: smile symmetric, facial light touch sensation normal bilaterally VIII: hearing normal bilaterally IX,X: gag reflex present XI: bilateral shoulder shrug XII: midline tongue extension Motor: RUE - 5/5    LUE - 5/5   RLE - 5/5    LLE - 5/5 Tone and bulk:normal tone throughout; no atrophy noted Sensory: Light touch intact throughout, bilaterally Deep Tendon Reflexes: 2+ and symmetric throughout Plantars: Right: downgoing   Left: downgoing Cerebellar: normal finger-to-nose and normal heel-to-shin test Gait: deferred at this time.   NIHSS 1a Level of Conscious:0 1b LOC Questions: 0 1c LOC Commands:0  2 Best Gaze: 0 3 Visual: 0 4 Facial Palsy:0  5a Motor Arm - left:0  5b Motor Arm - Right:0  6a Motor Leg - Left: 0 6b Motor Leg - Right:0  7 Limb Ataxia: 0 8 Sensory: 0 9 Best Language:0  10 Dysarthria:0 11 Extinct. and Inattention:0 TOTAL: 0   LABORATORY STUDIES:  Basic Metabolic Panel: Recent Labs  Lab 12/18/17 1426  NA 133*  K 4.3  CL 93*  GLUCOSE 231*  BUN 18  CREATININE 1.20    Liver Function Tests: No results for input(s): AST, ALT, ALKPHOS, BILITOT, PROT, ALBUMIN in the last 168 hours. No results  for input(s): LIPASE, AMYLASE in the last 168 hours. No results for input(s): AMMONIA in the last 168 hours.  CBC: Recent Labs  Lab 12/18/17 1419 12/18/17 1426  WBC 8.2  --   NEUTROABS 5.9  --   HGB 12.3* 12.6*  HCT 37.4* 37.0*  MCV 89.9  --   PLT 169  --     Cardiac Enzymes: No results for input(s): CKTOTAL, CKMB, CKMBINDEX, TROPONINI in the last 168 hours.  BNP: Invalid input(s): POCBNP  CBG: Recent Labs  Lab 12/18/17 1418  GLUCAP 222*    Microbiology:   Coagulation Studies: Recent Labs    12/18/17 1419  LABPROT 12.7  INR 0.96    Urinalysis: No results for input(s): COLORURINE, LABSPEC, PHURINE, GLUCOSEU, HGBUR, BILIRUBINUR, KETONESUR, PROTEINUR, UROBILINOGEN, NITRITE, LEUKOCYTESUR in the last 168 hours.  Invalid input(s): APPERANCEUR  Lipid Panel:  No results found for: CHOL, TRIG, HDL, CHOLHDL, VLDL, LDLCALC  HgbA1C:  No results found for: HGBA1C  Urine Drug Screen:  No results found for: LABOPIA, COCAINSCRNUR, LABBENZ, AMPHETMU, THCU, LABBARB   Alcohol Level:  No results for input(s):  ETH in the last 168 hours.   IMAGING: Ct Angio Head W Or Wo Contrast  Result Date: 12/18/2017 CLINICAL DATA:  Left facial droop EXAM: CT ANGIOGRAPHY HEAD AND NECK TECHNIQUE: Multidetector CT imaging of the head and neck was performed using the standard protocol during bolus administration of intravenous contrast. Multiplanar CT image reconstructions and MIPs were obtained to evaluate the vascular anatomy. Carotid stenosis measurements (when applicable) are obtained utilizing NASCET criteria, using the distal internal carotid diameter as the denominator. CONTRAST:  Reference EMR COMPARISON:  None. FINDINGS: CTA NECK FINDINGS Aortic arch: Atherosclerotic calcification.  No acute finding. Right carotid system: Atheromatous plaque at the brachiocephalic and common carotid. There is irregular mixed density plaque at the right ICA bulb with at least 75% stenosis. Left carotid  system: Extensive mainly calcified plaque at the proximal internal carotid with stenosis measuring up to 50%. No ulceration or dissection. Vertebral arteries: Proximal subclavian atherosclerosis without flow limiting stenosis. Atheromatous left vertebral artery with occasional extrinsic narrowing from hypertrophic facets. Diminutive congenitally small right vertebral artery with stenosis at the origin, effectively occluded by the dura. Skeleton: Diffuse degenerative disease without acute finding. Other neck: No acute finding. Upper chest: No acute finding.  Emphysema. Review of the MIP images confirms the above findings CTA HEAD FINDINGS Anterior circulation: Atherosclerotic plaque along the carotid siphons without suspected flow limiting stenosis. No large vessel occlusion or flow limiting stenosis. Right M4 branch calcification (correlating with calcification noted on prior noncontrast CT) would be an atypical location for atheromatous calcification and is most likely calcified embolus-chronicity uncertain. Negative for aneurysm. Posterior circulation: Left vertebral and basilar arteries are smooth and diffusely patent. Fetal type left PCA. Robust flow in bilateral posterior cerebral arteries. Venous sinuses: Limited assessment by contrast timing. Anatomic variants: As above Delayed phase: Not obtained in the emergent setting Review of the MIP images confirms the above findings IMPRESSION: 1. Negative for emergent large vessel occlusion. 2. Right inferior division M4 branch calcification is likely a calcified embolus at this location, of indeterminate chronicity. 3. Severe proximal right ICA stenosis. The underlying plaque is irregular and could be an embolic source. 4. 50% atheromatous narrowing at the left proximal ICA. 5. Severe stenosis at the origin of the non dominant right vertebral artery. Right PICA flow is likely retrograde from the basilar. 6.  Emphysema (ICD10-J43.9). Electronically Signed   By:  Marnee Spring M.D.   On: 12/18/2017 14:53   Ct Angio Neck W Or Wo Contrast  Result Date: 12/18/2017 CLINICAL DATA:  Left facial droop EXAM: CT ANGIOGRAPHY HEAD AND NECK TECHNIQUE: Multidetector CT imaging of the head and neck was performed using the standard protocol during bolus administration of intravenous contrast. Multiplanar CT image reconstructions and MIPs were obtained to evaluate the vascular anatomy. Carotid stenosis measurements (when applicable) are obtained utilizing NASCET criteria, using the distal internal carotid diameter as the denominator. CONTRAST:  Reference EMR COMPARISON:  None. FINDINGS: CTA NECK FINDINGS Aortic arch: Atherosclerotic calcification.  No acute finding. Right carotid system: Atheromatous plaque at the brachiocephalic and common carotid. There is irregular mixed density plaque at the right ICA bulb with at least 75% stenosis. Left carotid system: Extensive mainly calcified plaque at the proximal internal carotid with stenosis measuring up to 50%. No ulceration or dissection. Vertebral arteries: Proximal subclavian atherosclerosis without flow limiting stenosis. Atheromatous left vertebral artery with occasional extrinsic narrowing from hypertrophic facets. Diminutive congenitally small right vertebral artery with stenosis at the origin, effectively occluded by the dura. Skeleton: Diffuse  degenerative disease without acute finding. Other neck: No acute finding. Upper chest: No acute finding.  Emphysema. Review of the MIP images confirms the above findings CTA HEAD FINDINGS Anterior circulation: Atherosclerotic plaque along the carotid siphons without suspected flow limiting stenosis. No large vessel occlusion or flow limiting stenosis. Right M4 branch calcification (correlating with calcification noted on prior noncontrast CT) would be an atypical location for atheromatous calcification and is most likely calcified embolus-chronicity uncertain. Negative for aneurysm.  Posterior circulation: Left vertebral and basilar arteries are smooth and diffusely patent. Fetal type left PCA. Robust flow in bilateral posterior cerebral arteries. Venous sinuses: Limited assessment by contrast timing. Anatomic variants: As above Delayed phase: Not obtained in the emergent setting Review of the MIP images confirms the above findings IMPRESSION: 1. Negative for emergent large vessel occlusion. 2. Right inferior division M4 branch calcification is likely a calcified embolus at this location, of indeterminate chronicity. 3. Severe proximal right ICA stenosis. The underlying plaque is irregular and could be an embolic source. 4. 50% atheromatous narrowing at the left proximal ICA. 5. Severe stenosis at the origin of the non dominant right vertebral artery. Right PICA flow is likely retrograde from the basilar. 6.  Emphysema (ICD10-J43.9). Electronically Signed   By: Marnee Spring M.D.   On: 12/18/2017 14:53   Ct Head Code Stroke Wo Contrast  Result Date: 12/18/2017 CLINICAL DATA:  Code stroke.  Left facial droop EXAM: CT HEAD WITHOUT CONTRAST TECHNIQUE: Contiguous axial images were obtained from the base of the skull through the vertex without intravenous contrast. COMPARISON:  None. FINDINGS: Brain: No evidence of acute infarction, hemorrhage, hydrocephalus, extra-axial collection or mass lesion/mass effect. Generalized atrophy. Well-defined remote appearing lateral lenticulostriate infarct on the right extending through putamen and corona radiata. There is a solitary calcification within a right parietal sulcus. Vascular: Atherosclerotic calcification.  No hyperdense vessel. Skull: No acute finding Sinuses/Orbits: Negative Other: These results were communicated to Dr. Amada Jupiter at 2:33 pmon 6/30/2019by text page via the [facility] messaging system. ASPECTS [facility] Stroke Program Early CT Score) - Ganglionic level infarction (caudate, lentiform nuclei, internal capsule, insula, M1-M3 cortex): 7  - Supraganglionic infarction (M4-M6 cortex): 3 Total score (0-10 with 10 being normal): 10 IMPRESSION: 1. Negative for hemorrhage or visible acute infarct.  ASPECTS is 10. 2. Remote appearing lateral lenticulostriate infarct on the right. 3. Low right parietal sulcal calcification, calcific embolus is a consideration in this setting. Electronically Signed   By: Marnee Spring M.D.   On: 12/18/2017 14:36      Assessment: 84 y.o. male with dense left side stroke symptoms. Now resolved, no acute stroke treatments done.Stroke Risk Factors - Smoker, age, HTN, HLD, DM2, CAD, known carotid disease and stenosis.   # Stroke-like symptoms- TIA vs large vessel atheroembolic infarct. MRI and stroke wk up ordered.  -CTA: Severe RICA stenosis, LICA 50%. Severe R Vert stenosis  -ECHO: pending # Severe R ICA stenosis, irreg plaque- post MRI, will need vascular consult. Will start DAPT # HTN- permissive HTN to SBP 220 for now # DM2 w/hyperglycemia- A1C pending. Tight glucose control # HLD- lipid panel pending. Goal <70. Will start max dose of Lipitor 80mg  now given degree of stenosis seen.  # Tobacco smoker- counseled. Provide patch  Plan:  HgbA1c, fasting lipid panel  MRI brain  Echocardiogram  ASA 81mg  + Plavix 75mg  now, once cleared for PO  Allow for permissive hypertension for the first 24-48h - only treat PRN if SBP >220 mmHg. Blood pressures can  be gradually normalized to SBP<140 upon discharge  Lipitor 80mg  now, once cleared for PO  Risk factor modification: smoking cessation  Nicotine patch now  Telemetry monitoring  Frequent neuro checks  Fall Precautions  DVT prophelaxis  NPO until swallowing evaluation has been passed (aspirin suppository if needed)  Discussed with Dr Amada Jupiter. Attending Neurologist's note to follow

## 2017-12-18 NOTE — ED Notes (Signed)
Per Dr. Donnald GarrePfeiffer repeat EKG

## 2017-12-18 NOTE — H&P (Addendum)
Family Medicine Teaching [facility] Admission History and Physical Service Pager: [phone]  Patient name: Gary Lynn Medical record number: [id number] Date of birth: 1930-09-09 Age: 84 y.o. Gender: male  Primary Care Provider: Sherrie Mustache, MD Consultants: Neurology Code Status: Full  Chief Complaint:  Left sided weakness and facial droop  Assessment and Plan: Gary Lynn is a 84 y.o. male presenting with left sided weakness and facial droop. PMH is significant for CAD, carotid stenosis, HLD, HTN, T2DM, Paroxysmal Atrial Fibrillation, tobacco abuse.   Left sided weakness and facial droop concerning for TIA.  Patient had witnessed episode of difficulty waking up after sitting on the porch, left sided weakness and facial droop. Resolution of symptoms by the time he reached the ED. No TPA was given. Head CT scan was significant for low right parietal sulcal calcification with possible calcific embolus. Negative for hemorrhage. CTA of head and neck showed right inferior M4 division calcific embolus of unknown chronicity. Troponin negative at 0.01 with EKG sinus rhythm with RBBB and LAFB, unchanged from previous EKG making ACS unlikely cause. Patient also reports no chest pain or shortness of breath. CBG was high in EMS and on ED arrival so hypoglycemia unlikely. Intoxication does not fit description of event and patient was not altered in the ED. Episode is most consistent with TIA and resolution of symptoms by time of my exam.   - Admit to Telemetry, attending Dr. Deirdre Priest  - Neurology following; appreciate recs - Neuro checks Q4  - vitals per floor with pulse ox - MRI Brain - PT, OT to see  - Risk stratification labs (HgbA1c, Lipid, TSH), B12, folate  - Passed bedside swallow, SLP to evaluate  - F/u morning CBC, BMP  CAD s/p stent in 2002. Chronic. Patient had first left heart cath in 2002 with one stent placed. Had a follow up cath in 2013 that showed complete occlusion of  the stent but had collateral arteries supply so no treatment was done. On home ASA and plavix. - ASA 81mg  daily - Plavix 75mg  daily, consider stopping plavix  T2DM. Patient takes glipizide, linagliptin, and metformin at home. No recent HgbA1c. Glucose on admission was 235. - mSSI - monitor CBGs with meals and at night  HTN. BP 162/67 on admission. Will allow for permissive hypertension.  Patient takes Lisinopril and Coreg.  Patient was recently in the ED last week due to high blood pressure sent from his urologist.  He was sent home after observation.   - Cont Coreg 12.5mg  BID - Cont Lisinopril 20mg  daily  HLD. Patient is on home Atorvastatin 40mg  daily. - Atorvastatin increased to 80mg  daily - Obtaining lipid panel  Paroxysmal Atrial Fibrillation. Chronic. Patient presents in ED with sinus rhythm with rate controlled at 65-78. - Cont Coreg - Cont Plavix  Cartoid Artery Stenosis. Chronic. Stable. Patient is over 86y/o and would not be a good candidate for surgical repair. Possible source of embolism.  BPH. Chronic. Cont home meds Finasteride and Tamulosin - Cont Finasteride - Cont Tamulosin   FEN/GI: carb modified diet Prophylaxis: Full dose anticoagulation  Disposition: Inpatient Telemetry  History of Present Illness:  Gary Lynn is a 84 y.o. male with PMH of CAD, Carotid Stenosis, T2DM, HTN, HLD, Paroxsymal A. Fib presenting with left sided weakness and facial droop earlier this morning approximately around 11:30am. His wife saw him on the porch and went to wake him up and became concerned when he had difficulty waking. Upon waking he exhibited left  sided arm weakness and facial drooping. He has some recollection of waking up but did not feel any different. EMS was called and observed left sided facial weakness with improving left sided weakness. At the time of arrival to the ED his symptoms had resolved completely. He had no altered mental status, chest pain, SOB, nausea,  vomiting, diarrhea, or constipation.  Review Of Systems: Per HPI with the following additions:  Review of Systems  Constitutional: Negative for chills, fever and malaise/fatigue.  HENT: Negative for congestion.   Respiratory: Negative for cough, hemoptysis, sputum production, shortness of breath and wheezing.   Cardiovascular: Negative for chest pain, palpitations and leg swelling.  Gastrointestinal: Negative for abdominal pain, constipation, diarrhea, heartburn, nausea and vomiting.  Genitourinary: Negative for dysuria.  Musculoskeletal: Negative for back pain and myalgias.    Patient Active Problem List   Diagnosis Date Noted  . PAF (paroxysmal atrial fibrillation) (HCC)   . Chronic diastolic CHF (congestive heart failure) (HCC)   . 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 Other specified causes of urethral stricture 01/16/2015  . Nocturia 01/16/2015  . Essential hypertension 01/13/2015  . PVC's (premature ventricular contractions) 03/19/2013  . Carotid artery stenosis   . Coronary artery disease     Past Medical History: Past Medical History:  Diagnosis Date  . Carotid artery stenosis    a. carotid doppler 05/2016: RICA 40-59%, LICA 1-39%  . Chronic diastolic CHF (congestive heart failure) (HCC)    a. echo 05/2016: EF 55-60%, AK of the basal-midinferolateral and inferior myocardium (c/w prior study), GR1DD, aortic sclerosis without stenosis, mild MR, PASP 40 mmHg  . Coronary artery disease    Cardiac cath in February of 2013: EF 60%, high-grade chronic disease in OM 1, occluded mid RCA at the site of a previously placed stent with left-to-right collaterals, occluded left SFA.  . Diabetes mellitus with complication (HCC)   . Hypercholesterolemia   . Hypertension   . PAF (paroxysmal atrial fibrillation) (HCC)    a. Zio monitor 05/2016: predominant rhyhtm of sinus with intermittent episodes of Afib/flutter with the longest episode being 6 hours and 51  minutes with an average heart rate of 114 bpm. Occasional PACs and PVCs. b. CHADS2VASc --> 6 (CHF, HTN, age x 2, DM, vascular disease)    Past Surgical History: Past Surgical History:  Procedure Laterality Date  . BACK SURGERY     years ago  . CARDIAC CATHETERIZATION  07/2007   [facility]  . CARDIAC CATHETERIZATION  07/2011   [facility]  . CORONARY ANGIOPLASTY WITH STENT PLACEMENT      Social History: Social History   Tobacco Use  . Smoking status: Current Every Day Smoker    Packs/day: 0.50    Years: 50.00    Pack years: 25.00    Types: Cigarettes  . Smokeless tobacco: Never Used  . Tobacco comment: 1 pack per 3days  Substance Use Topics  . Alcohol use: No    Alcohol/week: 0.0 oz  . Drug use: No   Additional social history: None Please also refer to relevant sections of EMR.  Family History: Family History  Problem Relation Age of Onset  . Dementia Mother   . Heart attack Father   . Kidney disease Neg Hx   . Prostate cancer Neg Hx    Allergies and Medications: No Known Allergies No current facility-administered medications on file prior to encounter.    Current Outpatient Medications on File Prior to Encounter  Medication Sig Dispense Refill  .  atorvastatin (LIPITOR) 40 MG tablet Take 40 mg by mouth daily.    . carvedilol (COREG) 12.5 MG tablet Take 1 tablet (12.5 mg total) by mouth 2 (two) times daily. 60 tablet 0  . finasteride (PROSCAR) 5 MG tablet Take 1 tablet (5 mg total) by mouth daily. 90 tablet 4  . fish oil-omega-3 fatty acids 1000 MG capsule Take 2 g by mouth daily.    Marland Kitchen. glipiZIDE (GLUCOTROL XL) 5 MG 24 hr tablet Take 5 mg by mouth daily with breakfast.     . IRON PO Take 325 mg by mouth daily.     Marland Kitchen. linagliptin (TRADJENTA) 5 MG TABS tablet Take 5 mg by mouth daily.    Marland Kitchen. lisinopril (PRINIVIL,ZESTRIL) 10 MG tablet Take 10 mg by mouth 2 (two) times daily. 2 tablets BID    . magnesium oxide (MAG-OX) 400 MG tablet Take 1 tablet (400 mg total) by mouth daily. 30  tablet 3  . metFORMIN (GLUCOPHAGE) 1000 MG tablet Take 1,000 mg by mouth 2 (two) times daily with a meal.    . metoprolol tartrate (LOPRESSOR) 25 MG tablet Take 25 mg by mouth 2 (two) times daily.     Letta Pate. ONETOUCH VERIO test strip     . ranitidine (ZANTAC) 150 MG tablet Take 1 tablet (150 mg total) by mouth daily. 30 tablet 3  . tamsulosin (FLOMAX) 0.4 MG CAPS capsule Take 1 capsule (0.4 mg total) by mouth daily. 90 capsule 3  . XARELTO 20 MG TABS tablet       Objective: BP (!) 152/74   Pulse 67   Temp 98.9 F (37.2 C) (Oral)   Resp 17   Wt 141 lb 5 oz (64.1 kg)   SpO2 98%   BMI 25.03 kg/m  Exam: Gen: Alert and Oriented x 3, NAD HEENT: Normocephalic, atraumatic, PERRLA, EOMI CV: RRR, no murmurs, normal S1, S2 split, +2 pulses dorsalis pedis bilaterally Resp: CTAB, no wheezing, rales, or rhonchi, comfortable work of breathing Abd: non-distended, non-tender, soft, +bs in all four quadrants MSK: Moves all four extremities Ext: no clubbing, cyanosis, or edema Neuro: CN II-XII intact, no focal or gross deficits; gross sensation intact, heel to shin intact, strength UE and LE 5/5 Skin: warm, dry, intact, no rashes Psych: appropriate behavior, mood  Labs and Imaging: CBC BMET  Recent Labs  Lab 12/18/17 1419 12/18/17 1426  WBC 8.2  --   HGB 12.3* 12.6*  HCT 37.4* 37.0*  PLT 169  --    Recent Labs  Lab 12/18/17 1419 12/18/17 1426  NA 133* 133*  K 4.3 4.3  CL 95* 93*  CO2 26  --   BUN 17 18  CREATININE 1.29* 1.20  GLUCOSE 235* 231*  CALCIUM 8.8*  --      Troponin-I stat: 0.01 PT/INR: 12.7/0.96  CT Head w/o Contrast IMPRESSION: 1. Negative for hemorrhage or visible acute infarct.  ASPECTS is 10. 2. Remote appearing lateral lenticulostriate infarct on the right. 3. Low right parietal sulcal calcification, calcific embolus is a consideration in this setting.  CTA Head and Neck IMPRESSION: 1. Negative for emergent large vessel occlusion. 2. Right inferior division  M4 branch calcification is likely a calcified embolus at this location, of indeterminate chronicity. 3. Severe proximal right ICA stenosis. The underlying plaque is irregular and could be an embolic source. 4. 50% atheromatous narrowing at the left proximal ICA. 5. Severe stenosis at the origin of the non dominant right vertebral artery. Right PICA flow is likely retrograde  from the basilar. 6.  Emphysema (ICD10-J43.9).  Arlyce Harman, DO PGY-1, [facility]  I have seen and evaluated the above patient with Dr. Karen Chafe and agree with his documentation.  I have included my edits in blue.   Freddrick March MD  [facility] PGY-2

## 2017-12-18 NOTE — ED Triage Notes (Signed)
Patient arrives from home where he lives with his wife, brought in by [facility] for left sided facial droop. Last known well 1130. Patient alert and oriented at this time.

## 2017-12-18 NOTE — Progress Notes (Signed)
Patients family left before RN was able to ask admission questions, pt prefers to sleep at this time.

## 2017-12-19 ENCOUNTER — Other Ambulatory Visit (HOSPITAL_COMMUNITY): Payer: Medicare HMO

## 2017-12-19 DIAGNOSIS — G459 Transient cerebral ischemic attack, unspecified: Secondary | ICD-10-CM

## 2017-12-19 DIAGNOSIS — E785 Hyperlipidemia, unspecified: Secondary | ICD-10-CM

## 2017-12-19 DIAGNOSIS — I480 Paroxysmal atrial fibrillation: Secondary | ICD-10-CM | POA: Diagnosis not present

## 2017-12-19 DIAGNOSIS — F17200 Nicotine dependence, unspecified, uncomplicated: Secondary | ICD-10-CM | POA: Diagnosis not present

## 2017-12-19 DIAGNOSIS — I639 Cerebral infarction, unspecified: Secondary | ICD-10-CM | POA: Diagnosis not present

## 2017-12-19 DIAGNOSIS — I6521 Occlusion and stenosis of right carotid artery: Secondary | ICD-10-CM

## 2017-12-19 DIAGNOSIS — E1159 Type 2 diabetes mellitus with other circulatory complications: Secondary | ICD-10-CM | POA: Diagnosis not present

## 2017-12-19 LAB — CBC
HCT: 34.3 % — ABNORMAL LOW (ref 39.0–52.0)
Hemoglobin: 11.3 g/dL — ABNORMAL LOW (ref 13.0–17.0)
MCH: 29.4 pg (ref 26.0–34.0)
MCHC: 32.9 g/dL (ref 30.0–36.0)
MCV: 89.1 fL (ref 78.0–100.0)
PLATELETS: 150 10*3/uL (ref 150–400)
RBC: 3.85 MIL/uL — AB (ref 4.22–5.81)
RDW: 13.7 % (ref 11.5–15.5)
WBC: 8.9 10*3/uL (ref 4.0–10.5)

## 2017-12-19 LAB — GLUCOSE, CAPILLARY
Glucose-Capillary: 139 mg/dL — ABNORMAL HIGH (ref 70–99)
Glucose-Capillary: 155 mg/dL — ABNORMAL HIGH (ref 70–99)

## 2017-12-19 LAB — BASIC METABOLIC PANEL
ANION GAP: 6 (ref 5–15)
BUN: 16 mg/dL (ref 8–23)
CALCIUM: 8.5 mg/dL — AB (ref 8.9–10.3)
CO2: 28 mmol/L (ref 22–32)
CREATININE: 1.22 mg/dL (ref 0.61–1.24)
Chloride: 98 mmol/L (ref 98–111)
GFR calc non Af Amer: 53 mL/min — ABNORMAL LOW (ref >60)
Glucose, Bld: 131 mg/dL — ABNORMAL HIGH (ref 70–99)
Potassium: 3.3 mmol/L — ABNORMAL LOW (ref 3.5–5.1)
SODIUM: 132 mmol/L — AB (ref 135–145)

## 2017-12-19 LAB — MAGNESIUM: Magnesium: 1.4 mg/dL — ABNORMAL LOW (ref 1.7–2.4)

## 2017-12-19 MED ORDER — CARVEDILOL 6.25 MG PO TABS
6.2500 mg | ORAL_TABLET | Freq: Two times a day (BID) | ORAL | Status: DC
  Administered 2017-12-19: 6.25 mg via ORAL
  Filled 2017-12-19: qty 1

## 2017-12-19 MED ORDER — CYANOCOBALAMIN 1000 MCG PO TABS: 1000.0000 ug | ORAL_TABLET | Freq: Every day | ORAL | 0 refills | Status: DC

## 2017-12-19 MED ORDER — ATORVASTATIN CALCIUM 80 MG PO TABS: 80.0000 mg | ORAL_TABLET | Freq: Every day | ORAL | 0 refills | Status: DC

## 2017-12-19 MED ORDER — RIVAROXABAN 15 MG PO TABS: 15.0000 mg | ORAL_TABLET | Freq: Every day | ORAL | 0 refills | Status: DC

## 2017-12-19 MED ORDER — CLOPIDOGREL BISULFATE 75 MG PO TABS: 75.0000 mg | ORAL_TABLET | Freq: Every day | ORAL | 0 refills | Status: DC

## 2017-12-19 MED ORDER — VITAMIN B-12 1000 MCG PO TABS: 1000.0000 ug | ORAL_TABLET | Freq: Every day | ORAL | Status: DC

## 2017-12-19 MED ORDER — ASPIRIN EC 81 MG PO TBEC: 81.0000 mg | DELAYED_RELEASE_TABLET | Freq: Every day | ORAL | 0 refills | Status: DC

## 2017-12-19 MED ORDER — ADULT MULTIVITAMIN W/MINERALS CH
1.0000 | ORAL_TABLET | Freq: Every day | ORAL | Status: DC
  Administered 2017-12-19: 1 via ORAL
  Filled 2017-12-19: qty 1

## 2017-12-19 MED ORDER — CYANOCOBALAMIN 1000 MCG/ML IJ SOLN
1000.0000 ug | Freq: Once | INTRAMUSCULAR | Status: AC
  Administered 2017-12-19: 1000 ug via INTRAMUSCULAR
  Filled 2017-12-19: qty 1

## 2017-12-19 NOTE — Progress Notes (Signed)
Nurse went over discharge instruction with patient and family. Patient and family verbalized understanding of discharge. All questions and concerns addressed. Discharging home with all belongings. Taking down in a wheelchair.

## 2017-12-19 NOTE — Discharge Summary (Signed)
Family Medicine Teaching [facility] Discharge Summary  Patient name: Gary Lynn Medical record number: [id number] Date of birth: April 13, 1931 Age: 84 y.o. Gender: male Date of Admission: 12/18/2017  Date of Discharge: 12/19/17 Admitting Physician: Carney Living, MD  Primary Care Provider: Sherrie Mustache, MD Consultants: Neurology  Indication for Hospitalization: left sided facial droop  Discharge Diagnoses/Problem List:  TIA Chronic neurovascular ischemic damage Right carotid stenosis HTN HLD T2DM BPH  Disposition: to home  Discharge Condition: stable   Discharge Exam: General: pleasant, ambulated around room w/ walker in front of family, at baseline per wife HEENT: perrl, EOMI, mild facial assymetry (baseline per wife) Cardiovascular: RRR, no murmur noted Respiratory: CTAB, no wheeze/crackels, no IWB Abdomen: no TTP, soft, no masses noted Neuro: PERRL, EOMI, mild left sided facial assymetry (baseline per wife) but no sensation deficits, no other deficits nted to CN exam. Extremities: 5/5 strength bilaterally, no noted assymetry,    Brief Hospital Course:  84 year old who presented on 6/30 with left sided weakness and facial droop which started earlier than day. He had difficulty waking him and he exhibited the weakness and facial droop upon being aroused. By the time of arrival in the ED his symptoms had resolved so he was admitted for TIA workup. Neurology was consulted at time of arrival in the ed.  Patient had a CTA which showed right ICA stenosis >75% and L ICA stenosis at 50%. MRI brain showed no acute infarct.  Patient was completely back to baseline function both mentally and physically (ambulated with walker) per patient and multiple family members on 7/1 and opted for discharge.  Family and patient declined vascular surgery referral and ECHO as they were opting only for medical treatment.  They mentioned a family member had a negative event during an  intervention for carotid stenosis and they firmly refused to risk the same (and have refused in the past).  This was discussed with stroke team NP on call and decision was made to discharge for close pcp followup with neuro f/u in 4 wks on 3months of dual anti-platelet.   Ct Angio Head W Or Wo Contrast  Result Date: 12/18/2017 CLINICAL DATA:  Left facial droop EXAM: CT ANGIOGRAPHY HEAD AND NECK TECHNIQUE: Multidetector CT imaging of the head and neck was performed using the standard protocol during bolus administration of intravenous contrast. Multiplanar CT image reconstructions and MIPs were obtained to evaluate the vascular anatomy. Carotid stenosis measurements (when applicable) are obtained utilizing NASCET criteria, using the distal internal carotid diameter as the denominator. CONTRAST:  Reference EMR COMPARISON:  None. FINDINGS: CTA NECK FINDINGS Aortic arch: Atherosclerotic calcification.  No acute finding. Right carotid system: Atheromatous plaque at the brachiocephalic and common carotid. There is irregular mixed density plaque at the right ICA bulb with at least 75% stenosis. Left carotid system: Extensive mainly calcified plaque at the proximal internal carotid with stenosis measuring up to 50%. No ulceration or dissection. Vertebral arteries: Proximal subclavian atherosclerosis without flow limiting stenosis. Atheromatous left vertebral artery with occasional extrinsic narrowing from hypertrophic facets. Diminutive congenitally small right vertebral artery with stenosis at the origin, effectively occluded by the dura. Skeleton: Diffuse degenerative disease without acute finding. Other neck: No acute finding. Upper chest: No acute finding.  Emphysema. Review of the MIP images confirms the above findings CTA HEAD FINDINGS Anterior circulation: Atherosclerotic plaque along the carotid siphons without suspected flow limiting stenosis. No large vessel occlusion or flow limiting stenosis. Right M4 branch  calcification (correlating with  calcification noted on prior noncontrast CT) would be an atypical location for atheromatous calcification and is most likely calcified embolus-chronicity uncertain. Negative for aneurysm. Posterior circulation: Left vertebral and basilar arteries are smooth and diffusely patent. Fetal type left PCA. Robust flow in bilateral posterior cerebral arteries. Venous sinuses: Limited assessment by contrast timing. Anatomic variants: As above Delayed phase: Not obtained in the emergent setting Review of the MIP images confirms the above findings IMPRESSION: 1. Negative for emergent large vessel occlusion. 2. Right inferior division M4 branch calcification is likely a calcified embolus at this location, of indeterminate chronicity. 3. Severe proximal right ICA stenosis. The underlying plaque is irregular and could be an embolic source. 4. 50% atheromatous narrowing at the left proximal ICA. 5. Severe stenosis at the origin of the non dominant right vertebral artery. Right PICA flow is likely retrograde from the basilar. 6.  Emphysema (ICD10-J43.9). Electronically Signed   By: Marnee Spring M.D.   On: 12/18/2017 14:53   Ct Angio Neck W Or Wo Contrast  Result Date: 12/18/2017 CLINICAL DATA:  Left facial droop EXAM: CT ANGIOGRAPHY HEAD AND NECK TECHNIQUE: Multidetector CT imaging of the head and neck was performed using the standard protocol during bolus administration of intravenous contrast. Multiplanar CT image reconstructions and MIPs were obtained to evaluate the vascular anatomy. Carotid stenosis measurements (when applicable) are obtained utilizing NASCET criteria, using the distal internal carotid diameter as the denominator. CONTRAST:  Reference EMR COMPARISON:  None. FINDINGS: CTA NECK FINDINGS Aortic arch: Atherosclerotic calcification.  No acute finding. Right carotid system: Atheromatous plaque at the brachiocephalic and common carotid. There is irregular mixed density plaque at  the right ICA bulb with at least 75% stenosis. Left carotid system: Extensive mainly calcified plaque at the proximal internal carotid with stenosis measuring up to 50%. No ulceration or dissection. Vertebral arteries: Proximal subclavian atherosclerosis without flow limiting stenosis. Atheromatous left vertebral artery with occasional extrinsic narrowing from hypertrophic facets. Diminutive congenitally small right vertebral artery with stenosis at the origin, effectively occluded by the dura. Skeleton: Diffuse degenerative disease without acute finding. Other neck: No acute finding. Upper chest: No acute finding.  Emphysema. Review of the MIP images confirms the above findings CTA HEAD FINDINGS Anterior circulation: Atherosclerotic plaque along the carotid siphons without suspected flow limiting stenosis. No large vessel occlusion or flow limiting stenosis. Right M4 branch calcification (correlating with calcification noted on prior noncontrast CT) would be an atypical location for atheromatous calcification and is most likely calcified embolus-chronicity uncertain. Negative for aneurysm. Posterior circulation: Left vertebral and basilar arteries are smooth and diffusely patent. Fetal type left PCA. Robust flow in bilateral posterior cerebral arteries. Venous sinuses: Limited assessment by contrast timing. Anatomic variants: As above Delayed phase: Not obtained in the emergent setting Review of the MIP images confirms the above findings IMPRESSION: 1. Negative for emergent large vessel occlusion. 2. Right inferior division M4 branch calcification is likely a calcified embolus at this location, of indeterminate chronicity. 3. Severe proximal right ICA stenosis. The underlying plaque is irregular and could be an embolic source. 4. 50% atheromatous narrowing at the left proximal ICA. 5. Severe stenosis at the origin of the non dominant right vertebral artery. Right PICA flow is likely retrograde from the basilar. 6.   Emphysema (ICD10-J43.9). Electronically Signed   By: Marnee Spring M.D.   On: 12/18/2017 14:53   Mr Brain Wo Contrast  Result Date: 12/18/2017 CLINICAL DATA:  84 y/o  M; TIA, initial exam. EXAM: MRI HEAD WITHOUT  CONTRAST TECHNIQUE: Multiplanar, multiecho pulse sequences of the brain and surrounding structures were obtained without intravenous contrast. COMPARISON:  12/18/2017 CT head and CTA head. FINDINGS: Brain: No acute infarction, hemorrhage, hydrocephalus, extra-axial collection or mass lesion. Small chronic lacunar infarcts are present within the right putamen extending into corona radiata, left caudate head, and left caudate body. Several nonspecific foci of T2 FLAIR hyperintense signal abnormality in subcortical and periventricular white matter as well as pons are compatible with moderate chronic microvascular ischemic changes for age. Moderate brain parenchymal volume loss. Vascular: Normal flow voids. Skull and upper cervical spine: Normal marrow signal. Sinuses/Orbits: Negative. Other: None. IMPRESSION: 1. No acute intracranial abnormality. 2. Moderate chronic microvascular ischemic changes and parenchymal volume loss of the brain. 3. Small chronic lacunar infarcts of the basal ganglia. Electronically Signed   By: Mitzi HansenLance  Furusawa-Stratton M.D.   On: 12/18/2017 17:50   Ct Head Code Stroke Wo Contrast  Result Date: 12/18/2017 CLINICAL DATA:  Code stroke.  Left facial droop EXAM: CT HEAD WITHOUT CONTRAST TECHNIQUE: Contiguous axial images were obtained from the base of the skull through the vertex without intravenous contrast. COMPARISON:  None. FINDINGS: Brain: No evidence of acute infarction, hemorrhage, hydrocephalus, extra-axial collection or mass lesion/mass effect. Generalized atrophy. Well-defined remote appearing lateral lenticulostriate infarct on the right extending through putamen and corona radiata. There is a solitary calcification within a right parietal sulcus. Vascular:  Atherosclerotic calcification.  No hyperdense vessel. Skull: No acute finding Sinuses/Orbits: Negative Other: These results were communicated to Dr. Amada JupiterKirkpatrick at 2:33 pmon 6/30/2019by text page via the [facility] messaging system. ASPECTS [facility]([place] Stroke Program Early CT Score) - Ganglionic level infarction (caudate, lentiform nuclei, internal capsule, insula, M1-M3 cortex): 7 - Supraganglionic infarction (M4-M6 cortex): 3 Total score (0-10 with 10 being normal): 10 IMPRESSION: 1. Negative for hemorrhage or visible acute infarct.  ASPECTS is 10. 2. Remote appearing lateral lenticulostriate infarct on the right. 3. Low right parietal sulcal calcification, calcific embolus is a consideration in this setting. Electronically Signed   By: Marnee SpringJonathon  Watts M.D.   On: 12/18/2017 14:36     Issues for Follow Up:  1. Patient should be on dual anti-platelet therapy for 3 months per neurology. ASA 81mg , plavix 75mg . 2. [facility]. 3. Patient was offered a vascular surgery referral for significant carotid stenosis and declined due to negative event his brother went through from a similar intervention. 4. Lipitor was increased to 80mg  daily 5. Patient prescribed PT outpatient, please help ensure he receives these beneficial services 6. Patient had slightly low B12 and was started on supplementation  Significant Procedures:   Significant Labs and Imaging:  Recent Labs  Lab 12/18/17 1419 12/18/17 1426 12/19/17 0622  WBC 8.2  --  8.9  HGB 12.3* 12.6* 11.3*  HCT 37.4* 37.0* 34.3*  PLT 169  --  150   Recent Labs  Lab 12/18/17 1419 12/18/17 1426 12/18/17 1818 12/19/17 0622  NA 133* 133*  --  132*  K 4.3 4.3  --  3.3*  CL 95* 93*  --  98  CO2 26  --   --  28  GLUCOSE 235* 231*  --  131*  BUN 17 18  --  16  CREATININE 1.29* 1.20  --  1.22  CALCIUM 8.8*  --   --  8.5*  MG  --   --  1.5* 1.4*  ALKPHOS 54  --   --   --   AST  14*  --   --   --    ALT 10  --   --   --   ALBUMIN 3.1*  --   --   --      Results/Tests Pending at Time of Discharge:   Discharge Medications:  Allergies as of 12/19/2017   No Known Allergies     Medication List    TAKE these medications   aspirin EC 81 MG tablet Take 1 tablet (81 mg total) by mouth daily.   atorvastatin 80 MG tablet Commonly known as:  LIPITOR Take 1 tablet (80 mg total) by mouth daily at 6 PM. What changed:    medication strength  how much to take  when to take this   carvedilol 12.5 MG tablet Commonly known as:  COREG Take 1 tablet (12.5 mg total) by mouth 2 (two) times daily.   clopidogrel 75 MG tablet Commonly known as:  PLAVIX Take 1 tablet (75 mg total) by mouth daily. Start taking on:  12/20/2017   cyanocobalamin 1000 MCG tablet Take 1 tablet (1,000 mcg total) by mouth daily. Start taking on:  12/20/2017   ferrous sulfate 325 (65 FE) MG tablet Take 325 mg by mouth daily.   finasteride 5 MG tablet Commonly known as:  PROSCAR Take 1 tablet (5 mg total) by mouth daily.   fish oil-omega-3 fatty acids 1000 MG capsule Take 1 g by mouth 2 (two) times daily.   glipiZIDE 5 MG 24 hr tablet Commonly known as:  GLUCOTROL XL Take 5 mg by mouth daily before supper.   lisinopril 10 MG tablet Commonly known as:  PRINIVIL,ZESTRIL Take 20 mg by mouth 2 (two) times daily before a meal.   magnesium oxide 400 MG tablet Commonly known as:  MAG-OX Take 1 tablet (400 mg total) by mouth daily.   MAGNESIUM PO Take 1 tablet by mouth daily.   metFORMIN 1000 MG tablet Commonly known as:  GLUCOPHAGE Take 1,000 mg by mouth 2 (two) times daily before a meal.   naproxen sodium 220 MG tablet Commonly known as:  ALEVE Take 220 mg by mouth 2 (two) times daily as needed (pain/headache).   ONETOUCH VERIO test strip Generic drug:  glucose blood   ranitidine 150 MG tablet Commonly known as:  ZANTAC Take 1 tablet (150 mg total) by mouth daily. What changed:    when to take  this  reasons to take this   rivaroxaban 20 MG Tabs tablet Commonly known as:  XARELTO Take 20 mg by mouth daily before supper.   tamsulosin 0.4 MG Caps capsule Commonly known as:  FLOMAX Take 1 capsule (0.4 mg total) by mouth daily. What changed:  when to take this   TRADJENTA 5 MG Tabs tablet Generic drug:  linagliptin Take 5 mg by mouth daily before supper.       Discharge Instructions: Please refer to Patient Instructions section of EMR for full details.  Patient was counseled important signs and symptoms that should prompt return to medical care, changes in medications, dietary instructions, activity restrictions, and follow up appointments.   Follow-Up Appointments: Follow-up Information    Sherrie Mustache, MD. Schedule an appointment as soon as possible for a visit in 3 day(s).   Specialty:  Internal Medicine Contact information: [address] [place] [address] [phone]        [facility] Follow up in 4 week(s).   Why:  stroke followup appt. Contact information: [address]  [address] [place] [place] [address] [phone]          Marthenia Rolling, DO 12/19/2017, 2:09 PM PGY-2, [facility] Family [facility]

## 2017-12-19 NOTE — Progress Notes (Signed)
SLP Cancellation Note  Patient Details Name: Lequita HaltSamuel J Casanas MRN: [id number] DOB: 01/26/1931   Cancelled treatment:       Reason Eval/Treat Not Completed: Other (comment)- pt passed RN stroke swallow screen; therefore, per protocol, no SLP swallow evaluation is warranted.  Our services will sign off.   Blenda MountsCouture, Endiya Klahr Laurice 12/19/2017, 8:49 AM

## 2017-12-19 NOTE — Evaluation (Signed)
Occupational Therapy Evaluation Patient Details Name: Gary Lynn MRN: [id number] DOB: 20-Jul-1930 Today's Date: 12/19/2017    History of Present Illness Patient is an 84 y/o male presenting with L sided weakness and facial drrop. Resolution of symptoms by the time he reached the ED. No TPA was given. Head CT scan was significant for low right parietal sulcal calcification with possible calcific embolus. Negative for hemorrhage. CTA of head and neck showed right inferior M4 division calcific embolus of unknown chronicity. Patient with a PMH significant for CAD, carotid stenosis, HLD, HTN, T2DM, Paroxysmal Atrial Fibrillation, tobacco abuse.    Clinical Impression   Pt reports he was independent with ADL PTA. Currently pt requires min guard assist for functional mobility and LB ADL with sit to stand. Pt reports he feels at or close to his functional baseline with some balance deficits and hx of falls at home. Pt planning to d/c home with 24/7 supervision from family. Pt would benefit from continued skilled OT to address established goals.    Follow Up Recommendations  No OT follow up;Supervision/Assistance - 24 hour    Equipment Recommendations  None recommended by OT    Recommendations for Other Services       Precautions / Restrictions Precautions Precautions: Fall Restrictions Weight Bearing Restrictions: No      Mobility Bed Mobility Overal bed mobility: Modified Independent               Transfers Overall transfer level: Needs assistance Equipment used: Rolling walker (2 wheeled) Transfers: Sit to/from Stand Sit to Stand: Min guard         General transfer comment: Min guard for safety, good hand placement and safety with RW    Balance Overall balance assessment: Needs assistance Sitting-balance support: No upper extremity supported;Feet supported Sitting balance-Leahy Scale: Good     Standing balance support: No upper extremity supported;During  functional activity Standing balance-Leahy Scale: Fair                             ADL either performed or assessed with clinical judgement   ADL Overall ADL's : Needs assistance/impaired Eating/Feeding: Set up;Sitting   Grooming: Set up;Sitting   Upper Body Bathing: Set up;Sitting   Lower Body Bathing: Min guard;Sit to/from stand   Upper Body Dressing : Set up;Sitting   Lower Body Dressing: Min guard;Sit to/from stand   Toilet Transfer: Min guard;Ambulation;RW Toilet Transfer Details (indicate cue type and reason): Simulated with sit to stand from EOB with functional mobility     Tub/ Shower Transfer: Min guard;Tub transfer;Ambulation;Shower Dealer Details (indicate cue type and reason): Discussed utilizing shower chair for safety and fall prevention during bathing Functional mobility during ADLs: Min guard;Rolling walker       Vision Baseline Vision/History: Wears glasses Wears Glasses: At all times Patient Visual Report: No change from baseline Vision Assessment?: No apparent visual deficits     Perception     Praxis      Pertinent Vitals/Pain Pain Assessment: No/denies pain     Hand Dominance     Extremity/Trunk Assessment Upper Extremity Assessment Upper Extremity Assessment: Generalized weakness   Lower Extremity Assessment Lower Extremity Assessment: Defer to PT evaluation   Cervical / Trunk Assessment Cervical / Trunk Assessment: Kyphotic   Communication Communication Communication: No difficulties   Cognition Arousal/Alertness: Awake/alert Behavior During Therapy: WFL for tasks assessed/performed Overall Cognitive Status: Within Functional Limits for tasks assessed  General Comments       Exercises     Shoulder Instructions      Home Living Family/patient expects to be discharged to:: Private residence Living Arrangements: Spouse/significant  other Available Help at Discharge: Family;Available 24 hours/day Type of Home: House Home Access: Stairs to enter Entergy CorporationEntrance Stairs-Number of Steps: 3 Entrance Stairs-Rails: Can reach both Home Layout: One level     Bathroom Shower/Tub: Tub/shower unit;Curtain   Bathroom Toilet: Handicapped height     Home Equipment: Emergency planning/management officerhower seat;Walker - 2 wheels;Cane - single point;Grab bars - tub/shower          Prior Functioning/Environment Level of Independence: Independent with assistive device(s)        Comments: intermittetn use of RW        OT Problem List: Decreased strength;Decreased activity tolerance;Impaired balance (sitting and/or standing)      OT Treatment/Interventions: Self-care/ADL training;Therapeutic exercise;Energy conservation;DME and/or AE instruction;Therapeutic activities;Patient/family education;Balance training    OT Goals(Current goals can be found in the care plan section) Acute Rehab OT Goals Patient Stated Goal: return home OT Goal Formulation: With patient/family Time For Goal Achievement: 01/02/18 Potential to Achieve Goals: Good ADL Goals Pt Will Perform Grooming: with modified independence;standing Pt Will Perform Lower Body Bathing: with modified independence;sit to/from stand Pt Will Perform Lower Body Dressing: with modified independence;sit to/from stand Pt Will Transfer to Toilet: with modified independence;ambulating;regular height toilet Pt Will Perform Toileting - Clothing Manipulation and hygiene: with modified independence;sit to/from stand Pt/caregiver will Perform Home Exercise Program: Increased strength;Both right and left upper extremity;With theraband;Independently  OT Frequency: Min 2X/week   Barriers to D/C:            Co-evaluation              AM-PAC PT "6 Clicks" Daily Activity     Outcome Measure Help from another person eating meals?: None Help from another person taking care of personal grooming?: A Little Help  from another person toileting, which includes using toliet, bedpan, or urinal?: A Little Help from another person bathing (including washing, rinsing, drying)?: A Little Help from another person to put on and taking off regular upper body clothing?: None Help from another person to put on and taking off regular lower body clothing?: A Little 6 Click Score: 20   End of Session Equipment Utilized During Treatment: Gait belt;Rolling walker  Activity Tolerance: Patient tolerated treatment well Patient left: in bed;with call bell/phone within reach;with family/visitor present  OT Visit Diagnosis: Unsteadiness on feet (R26.81);History of falling (Z91.81)                Time: [id number] OT Time Calculation (min): 20 min Charges:  OT General Charges $OT Visit: 1 Visit OT Evaluation $OT Eval Moderate Complexity: 1 Mod G-Codes:     Saturnino Liew A. Brett Albinooffey, M.S., OTR/L Acute Rehab Department: [phone]  Gaye AlkenBailey A Jacilyn Sanpedro 12/19/2017, 1:52 PM

## 2017-12-19 NOTE — Care Management Note (Signed)
Case Management Note  Patient Details  Name: Gary Lynn MRN: [id number] Date of Birth: June 19, 1931  Subjective/Objective:      Pt in with TIA. He is from home with his spouse.              Action/Plan: Pt discharging [facility]. CM provided the patient and his wife choice and they stated [facility] and would like to use them again. Alvino ChapelEllen with [facility] notified.  PCP: Dr Dario GuardianJadali Insurance: [employer] Pt has transportation home.   Expected Discharge Date:  12/19/17               Expected Discharge Plan:  Home w Home Health Services  In-House Referral:     Discharge planning Services  CM Consult  Post Acute Care Choice:  Home Health Choice offered to:  Patient, Spouse  DME Arranged:    DME Agency:     HH Arranged:  PT HH Agency:  [facility]  Status of Service:  Completed, signed off  If discussed at Long Length of Stay Meetings, dates discussed:    Additional Comments:  Kermit BaloKelli F Andray Assefa, RN 12/19/2017, 3:00 PM

## 2017-12-19 NOTE — Evaluation (Signed)
Physical Therapy Evaluation Patient Details Name: Gary Lynn MRN: [id number] DOB: March 03, 1931 Today's Date: 12/19/2017   History of Present Illness  Patient is an 84 y/o male presenting with L sided weakness and facial drrop. Resolution of symptoms by the time he reached the ED. No TPA was given. Head CT scan was significant for low right parietal sulcal calcification with possible calcific embolus. Negative for hemorrhage. CTA of head and neck showed right inferior M4 division calcific embolus of unknown chronicity. Patient with a PMH significant for CAD, carotid stenosis, HLD, HTN, T2DM, Paroxysmal Atrial Fibrillation, tobacco abuse.   Clinical Impression  Gary Lynn is a very pleasant 84 y/o male admitted with the above listed diagnosis. Wife and daughters present for session. Prior to admission patient reports independence with mobility with intermittent use of RW. Does have a history of falls within the home and recently finished bout of HHPT. Patient today requiring Min guard for transfers and mobility, but with assist for RW management in hallways for obstacle navigation. PT to continue to follow to maxmiize safe functional mobility prior to d/c. PT recommending HHPT to ensure safe transition into the home environment.     Follow Up Recommendations Home health PT;Supervision/Assistance - 24 hour    Equipment Recommendations  None recommended by PT    Recommendations for Other Services       Precautions / Restrictions Precautions Precautions: Fall Restrictions Weight Bearing Restrictions: No      Mobility  Bed Mobility               General bed mobility comments: patient up with nursing in restroom upon PT arrival  Transfers Overall transfer level: Needs assistance Equipment used: Rolling walker (2 wheeled) Transfers: Sit to/from Stand Sit to Stand: Min guard         General transfer comment: for safety  Ambulation/Gait Ambulation/Gait assistance: Min  guard;Min assist Gait Distance (Feet): 150 Feet Assistive device: Rolling walker (2 wheeled) Gait Pattern/deviations: Step-through pattern;Decreased stride length;Trunk flexed;Drifts right/left Gait velocity: decreased   General Gait Details: Min A only for RW management; min guard for safety - no LOB, but unsteady at times  [employer] Mobility    Modified Rankin (Stroke Patients Only)       Balance Overall balance assessment: Needs assistance Sitting-balance support: No upper extremity supported;Feet supported Sitting balance-Leahy Scale: Good     Standing balance support: Bilateral upper extremity supported;During functional activity Standing balance-Leahy Scale: Fair                               Pertinent Vitals/Pain Pain Assessment: No/denies pain    Home Living Family/patient expects to be discharged to:: Private residence Living Arrangements: Spouse/significant other Available Help at Discharge: Family;Available 24 hours/day Type of Home: House Home Access: Stairs to enter Entrance Stairs-Rails: Can reach both Entrance Stairs-Number of Steps: 3 Home Layout: One level Home Equipment: Emergency planning/management officer - 2 wheels;Cane - single point      Prior Function Level of Independence: Independent with assistive device(s)         Comments: intermittetn use of RW     Hand Dominance        Extremity/Trunk Assessment   Upper Extremity Assessment Upper Extremity Assessment: Defer to OT evaluation    Lower Extremity Assessment Lower Extremity Assessment: Generalized weakness    Cervical / Trunk Assessment Cervical / Trunk  Assessment: Kyphotic  Communication   Communication: No difficulties  Cognition Arousal/Alertness: Awake/alert Behavior During Therapy: WFL for tasks assessed/performed Overall Cognitive Status: Within Functional Limits for tasks assessed                                         General Comments      Exercises     Assessment/Plan    PT Assessment Patient needs continued PT services  PT Problem List Decreased strength;Decreased activity tolerance;Decreased balance;Decreased mobility;Decreased knowledge of use of DME;Decreased safety awareness;Decreased knowledge of precautions       PT Treatment Interventions DME instruction;Gait training;Stair training;Functional mobility training;Therapeutic activities;Therapeutic exercise;Balance training;Neuromuscular re-education;Cognitive remediation;Patient/family education    PT Goals (Current goals can be found in the Care Plan section)  Acute Rehab PT Goals Patient Stated Goal: return home PT Goal Formulation: With patient Time For Goal Achievement: 01/02/18 Potential to Achieve Goals: Good    Frequency Min 4X/week   Barriers to discharge        Co-evaluation               AM-PAC PT "6 Clicks" Daily Activity  Outcome Measure Difficulty turning over in bed (including adjusting bedclothes, sheets and blankets)?: A Little Difficulty moving from lying on back to sitting on the side of the bed? : A Little Difficulty sitting down on and standing up from a chair with arms (e.g., wheelchair, bedside commode, etc,.)?: Unable Help needed moving to and from a bed to chair (including a wheelchair)?: A Little Help needed walking in hospital room?: A Little Help needed climbing 3-5 steps with a railing? : A Little 6 Click Score: 16    End of Session Equipment Utilized During Treatment: Gait belt Activity Tolerance: Patient tolerated treatment well Patient left: in bed;with call bell/phone within reach;with nursing/sitter in room;with family/visitor present Nurse Communication: Mobility status PT Visit Diagnosis: Unsteadiness on feet (R26.81);Other abnormalities of gait and mobility (R26.89);Muscle weakness (generalized) (M62.81)    Time: [id number] PT Time Calculation (min) (ACUTE ONLY): 16 min   Charges:    PT Evaluation $PT Eval Moderate Complexity: 1 Mod     PT G Codes:        Kipp LaurenceStephanie R Aydyn Testerman, PT, DPT 12/19/17 11:53 AM

## 2017-12-19 NOTE — Progress Notes (Signed)
Family Medicine Teaching Service Daily Progress Note Intern Pager: [phone]  Patient name: Gary Lynn Medical record number: [id number] Date of birth: [birth year] Age: 84 y.o. Gender: male  Primary Care Provider: Sherrie Mustache, MD Consultants: neuro Code Status: full  Pt Overview and Major Events to Date:  6/30 admitted  Assessment and Plan: Gary Lynn is a 84 y.o. male presenting with left sided weakness and facial droop. PMH is significant for CAD, carotid stenosis, HLD, HTN, T2DM, Paroxysmal Atrial Fibrillation, tobacco abuse.   Left sided weakness and facial droop concerning for TIA.   No TPA was given. Head CT scan was significant for low right parietal sulcal calcification with possible calcific embolus. Negative for hemorrhage. CTA of head and neck showed right inferior M4 division calcific embolus of unknown chronicity. Troponin negative at 0.01 with EKG sinus rhythm with RBBB and LAFB, unchanged from previous EKG making ACS unlikely cause.  CBG was high in EMS and on ED arrival so hypoglycemia unlikely. Intoxication does not fit description of event and patient was not altered in the ED. Episode is most consistent with TIA.  HgbA1C 7.1.  MRI brai eg for acute infarct, chronic ischemic changes only.  Lipids sligtly elevated, TSH wnl, B12 slightly low. Patient declines carotid stent/surgical intervention - Admit to Telemetry, attending Dr. Deirdre Priest  - Neurology following; appreciate recs - Neuro checks Q4  - Echo pending - vitals per floor with pulse ox - PT, OT to see  - Passed bedside swallow, SLP to evaluate  - F/u morning CBC, BMP  CAD s/p stent in 2002. Chronic. Patient had first left heart cath in 2002 with one stent placed. Had a follow up cath in 2013 that showed complete occlusion of the stent but had collateral arteries supply so no treatment was done. On home ASA and plavix. - ASA 81mg  daily - Plavix 75mg  daily, per neuro for now  T2DM. A1C 7.1Patient takes  glipizide, linagliptin, and metformin at home. No recent HgbA1c. Glucose on admission was 235. - mSSI - monitor CBGs with meals and at night  HTN. BP 140s sbp overnight 6/30. Will allow for permissive hypertension.  Patient takes Lisinopril and Coreg.  Patient was recently in the ED last week due to high blood pressure sent from his urologist.  He was sent home after observation.   - hold Coreg 12.5mg  BID - hold Lisinopril 20mg  daily  Hypomagnesemia: 1.5 on admission, -will recheck and then replenish as needed. -startign multvitamin  HLD. Patient is on home Atorvastatin 40mg  daily. - Atorvastatin increased to 80mg  daily  Paroxysmal Atrial Fibrillation. Chronic. Patient presents in ED with sinus rhythm with rate controlled at 65-78. - Cont Coreg - Cont Plavix  Cartoid Artery Stenosis. Chronic. Stable. Patient is over 86y/o and would not be a good candidate for surgical repair. Possible source of embolism.  BPH. Chronic. Cont home meds Finasteride and Tamulosin - Cont Finasteride - Cont Tamulosin   FEN/GI: carb modified diet Prophylaxis: Full dose anticoagulation  Disposition: Inpatient Telemetry, likely to home pending neuro/pt/ot workup  Subjective:  Pleasant and talkative with wife at bedside.  Both say he is at baseline which includes a slight facial assymetry and some left leg drop when ambulating.  No headaches/visual changes/or recurrence of acute stroke symtpoms from yesterday.  They did share that carotid stent/surgery was discussed with them in the past and they are declining because of a negative event suffered by his brother after a similar procedure.  Objective: Temp:  [98.1 F (  36.7 C)-99.1 F (37.3 C)] 98.2 F (36.8 C) (07/01 0425) Pulse Rate:  [65-84] 75 (07/01 0425) Resp:  [12-20] 18 (07/01 0425) BP: (107-194)/(43-84) 143/75 (07/01 0425) SpO2:  [93 %-100 %] 97 % (07/01 0425) Weight:  [141 lb 5 oz (64.1 kg)] 141 lb 5 oz (64.1 kg) (06/30 1400) Physical  Exam: General: pleasant, was tired at 6:30am, at baseline per wife HEENT: perrl, EOMI, mild facial assymetry (baseline per wife) Cardiovascular: RRR, no murmur noted Respiratory: CTAB, no wheeze/crackels, no IWB Abdomen: no TTP, soft, no masses noted Neuro: PERRL, EOMI, mild left sided facial assymetry (baseline per wife) but no sensation deficits, no other deficits nted to CN exam. Extremities: 5/5 strength bilaterally, no noted assymetry,   Laboratory: Recent Labs  Lab 12/18/17 1419 12/18/17 1426  WBC 8.2  --   HGB 12.3* 12.6*  HCT 37.4* 37.0*  PLT 169  --    Recent Labs  Lab 12/18/17 1419 12/18/17 1426  NA 133* 133*  K 4.3 4.3  CL 95* 93*  CO2 26  --   BUN 17 18  CREATININE 1.29* 1.20  CALCIUM 8.8*  --   PROT 6.5  --   BILITOT 0.6  --   ALKPHOS 54  --   ALT 10  --   AST 14*  --   GLUCOSE 235* 231*     Imaging/Diagnostic Tests: Ct Angio Head W Or Wo Contrast  Result Date: 12/18/2017 CLINICAL DATA:  Left facial droop EXAM: CT ANGIOGRAPHY HEAD AND NECK TECHNIQUE: Multidetector CT imaging of the head and neck was performed using the standard protocol during bolus administration of intravenous contrast. Multiplanar CT image reconstructions and MIPs were obtained to evaluate the vascular anatomy. Carotid stenosis measurements (when applicable) are obtained utilizing NASCET criteria, using the distal internal carotid diameter as the denominator. CONTRAST:  Reference EMR COMPARISON:  None. FINDINGS: CTA NECK FINDINGS Aortic arch: Atherosclerotic calcification.  No acute finding. Right carotid system: Atheromatous plaque at the brachiocephalic and common carotid. There is irregular mixed density plaque at the right ICA bulb with at least 75% stenosis. Left carotid system: Extensive mainly calcified plaque at the proximal internal carotid with stenosis measuring up to 50%. No ulceration or dissection. Vertebral arteries: Proximal subclavian atherosclerosis without flow limiting  stenosis. Atheromatous left vertebral artery with occasional extrinsic narrowing from hypertrophic facets. Diminutive congenitally small right vertebral artery with stenosis at the origin, effectively occluded by the dura. Skeleton: Diffuse degenerative disease without acute finding. Other neck: No acute finding. Upper chest: No acute finding.  Emphysema. Review of the MIP images confirms the above findings CTA HEAD FINDINGS Anterior circulation: Atherosclerotic plaque along the carotid siphons without suspected flow limiting stenosis. No large vessel occlusion or flow limiting stenosis. Right M4 branch calcification (correlating with calcification noted on prior noncontrast CT) would be an atypical location for atheromatous calcification and is most likely calcified embolus-chronicity uncertain. Negative for aneurysm. Posterior circulation: Left vertebral and basilar arteries are smooth and diffusely patent. Fetal type left PCA. Robust flow in bilateral posterior cerebral arteries. Venous sinuses: Limited assessment by contrast timing. Anatomic variants: As above Delayed phase: Not obtained in the emergent setting Review of the MIP images confirms the above findings IMPRESSION: 1. Negative for emergent large vessel occlusion. 2. Right inferior division M4 branch calcification is likely a calcified embolus at this location, of indeterminate chronicity. 3. Severe proximal right ICA stenosis. The underlying plaque is irregular and could be an embolic source. 4. 50% atheromatous narrowing at the left  proximal ICA. 5. Severe stenosis at the origin of the non dominant right vertebral artery. Right PICA flow is likely retrograde from the basilar. 6.  Emphysema (ICD10-J43.9). Electronically Signed   By: Marnee SpringJonathon  Watts M.D.   On: 12/18/2017 14:53   Ct Angio Neck W Or Wo Contrast  Result Date: 12/18/2017 CLINICAL DATA:  Left facial droop EXAM: CT ANGIOGRAPHY HEAD AND NECK TECHNIQUE: Multidetector CT imaging of the head  and neck was performed using the standard protocol during bolus administration of intravenous contrast. Multiplanar CT image reconstructions and MIPs were obtained to evaluate the vascular anatomy. Carotid stenosis measurements (when applicable) are obtained utilizing NASCET criteria, using the distal internal carotid diameter as the denominator. CONTRAST:  Reference EMR COMPARISON:  None. FINDINGS: CTA NECK FINDINGS Aortic arch: Atherosclerotic calcification.  No acute finding. Right carotid system: Atheromatous plaque at the brachiocephalic and common carotid. There is irregular mixed density plaque at the right ICA bulb with at least 75% stenosis. Left carotid system: Extensive mainly calcified plaque at the proximal internal carotid with stenosis measuring up to 50%. No ulceration or dissection. Vertebral arteries: Proximal subclavian atherosclerosis without flow limiting stenosis. Atheromatous left vertebral artery with occasional extrinsic narrowing from hypertrophic facets. Diminutive congenitally small right vertebral artery with stenosis at the origin, effectively occluded by the dura. Skeleton: Diffuse degenerative disease without acute finding. Other neck: No acute finding. Upper chest: No acute finding.  Emphysema. Review of the MIP images confirms the above findings CTA HEAD FINDINGS Anterior circulation: Atherosclerotic plaque along the carotid siphons without suspected flow limiting stenosis. No large vessel occlusion or flow limiting stenosis. Right M4 branch calcification (correlating with calcification noted on prior noncontrast CT) would be an atypical location for atheromatous calcification and is most likely calcified embolus-chronicity uncertain. Negative for aneurysm. Posterior circulation: Left vertebral and basilar arteries are smooth and diffusely patent. Fetal type left PCA. Robust flow in bilateral posterior cerebral arteries. Venous sinuses: Limited assessment by contrast timing.  Anatomic variants: As above Delayed phase: Not obtained in the emergent setting Review of the MIP images confirms the above findings IMPRESSION: 1. Negative for emergent large vessel occlusion. 2. Right inferior division M4 branch calcification is likely a calcified embolus at this location, of indeterminate chronicity. 3. Severe proximal right ICA stenosis. The underlying plaque is irregular and could be an embolic source. 4. 50% atheromatous narrowing at the left proximal ICA. 5. Severe stenosis at the origin of the non dominant right vertebral artery. Right PICA flow is likely retrograde from the basilar. 6.  Emphysema (ICD10-J43.9). Electronically Signed   By: Marnee SpringJonathon  Watts M.D.   On: 12/18/2017 14:53   Mr Brain Wo Contrast  Result Date: 12/18/2017 CLINICAL DATA:  84 y/o  M; TIA, initial exam. EXAM: MRI HEAD WITHOUT CONTRAST TECHNIQUE: Multiplanar, multiecho pulse sequences of the brain and surrounding structures were obtained without intravenous contrast. COMPARISON:  12/18/2017 CT head and CTA head. FINDINGS: Brain: No acute infarction, hemorrhage, hydrocephalus, extra-axial collection or mass lesion. Small chronic lacunar infarcts are present within the right putamen extending into corona radiata, left caudate head, and left caudate body. Several nonspecific foci of T2 FLAIR hyperintense signal abnormality in subcortical and periventricular white matter as well as pons are compatible with moderate chronic microvascular ischemic changes for age. Moderate brain parenchymal volume loss. Vascular: Normal flow voids. Skull and upper cervical spine: Normal marrow signal. Sinuses/Orbits: Negative. Other: None. IMPRESSION: 1. No acute intracranial abnormality. 2. Moderate chronic microvascular ischemic changes and parenchymal volume loss of  the brain. 3. Small chronic lacunar infarcts of the basal ganglia. Electronically Signed   By: Mitzi Hansen M.D.   On: 12/18/2017 17:50   Ct Head Code Stroke  Wo Contrast  Result Date: 12/18/2017 CLINICAL DATA:  Code stroke.  Left facial droop EXAM: CT HEAD WITHOUT CONTRAST TECHNIQUE: Contiguous axial images were obtained from the base of the skull through the vertex without intravenous contrast. COMPARISON:  None. FINDINGS: Brain: No evidence of acute infarction, hemorrhage, hydrocephalus, extra-axial collection or mass lesion/mass effect. Generalized atrophy. Well-defined remote appearing lateral lenticulostriate infarct on the right extending through putamen and corona radiata. There is a solitary calcification within a right parietal sulcus. Vascular: Atherosclerotic calcification.  No hyperdense vessel. Skull: No acute finding Sinuses/Orbits: Negative Other: These results were communicated to Dr. Amada Jupiter at 2:33 pmon 6/30/2019by text page via the [facility] messaging system. ASPECTS [facility] Stroke Program Early CT Score) - Ganglionic level infarction (caudate, lentiform nuclei, internal capsule, insula, M1-M3 cortex): 7 - Supraganglionic infarction (M4-M6 cortex): 3 Total score (0-10 with 10 being normal): 10 IMPRESSION: 1. Negative for hemorrhage or visible acute infarct.  ASPECTS is 10. 2. Remote appearing lateral lenticulostriate infarct on the right. 3. Low right parietal sulcal calcification, calcific embolus is a consideration in this setting. Electronically Signed   By: Marnee Spring M.D.   On: 12/18/2017 14:36     Marthenia Rolling, DO 12/19/2017, 5:39 AM PGY-1, [facility] FPTS Intern pager: [phone], text pages welcome

## 2017-12-19 NOTE — Progress Notes (Addendum)
STROKE TEAM PROGRESS NOTE   INTERVAL HISTORY His wife and 3 dtrs are at the bedside.  He has been stable over night, no further neuro symptoms.   Patient reports he has had vascular evaluation in the past at [facility]. He states he had a brother who underwent a carotid endarterectomy who had resultant comatose state following, therefore, he is unwilling to consider vascular surgery.  Per wife, he just finished physical therapy the end of June as he "drags his foot".   Vitals:   12/19/17 0340 12/19/17 0425 12/19/17 0809 12/19/17 1229  BP: (!) 141/78 (!) 143/75 122/60 (!) 139/58  Pulse: 72 75 71 76  Resp: 18 18 18 18   Temp: 98.1 F (36.7 C) 98.2 F (36.8 C) 98.4 F (36.9 C) 98.3 F (36.8 C)  TempSrc: Oral Oral Oral Oral  SpO2: 97% 97% 98% 99%  Weight:      Height:        CBC:  Recent Labs  Lab 12/18/17 1419 12/18/17 1426 12/19/17 0622  WBC 8.2  --  8.9  NEUTROABS 5.9  --   --   HGB 12.3* 12.6* 11.3*  HCT 37.4* 37.0* 34.3*  MCV 89.9  --  89.1  PLT 169  --  150    Basic Metabolic Panel:  Recent Labs  Lab 12/18/17 1419 12/18/17 1426 12/18/17 1818 12/19/17 0622  NA 133* 133*  --  132*  K 4.3 4.3  --  3.3*  CL 95* 93*  --  98  CO2 26  --   --  28  GLUCOSE 235* 231*  --  131*  BUN 17 18  --  16  CREATININE 1.29* 1.20  --  1.22  CALCIUM 8.8*  --   --  8.5*  MG  --   --  1.5* 1.4*   Lipid Panel:     Component Value Date/Time   CHOL 163 12/18/2017 1818   TRIG 158 (H) 12/18/2017 1818   HDL 31 (L) 12/18/2017 1818   CHOLHDL 5.3 12/18/2017 1818   VLDL 32 12/18/2017 1818   LDLCALC 100 (H) 12/18/2017 1818   HgbA1c:  Lab Results  Component Value Date   HGBA1C 7.1 (H) 12/18/2017   Urine Drug Screen: No results found for: LABOPIA, COCAINSCRNUR, LABBENZ, AMPHETMU, THCU, LABBARB  Alcohol Level No results found for: ETH  IMAGING Ct Angio Head W Or Wo Contrast  Result Date: 12/18/2017 CLINICAL DATA:  Left facial droop EXAM: CT ANGIOGRAPHY HEAD AND NECK TECHNIQUE:  Multidetector CT imaging of the head and neck was performed using the standard protocol during bolus administration of intravenous contrast. Multiplanar CT image reconstructions and MIPs were obtained to evaluate the vascular anatomy. Carotid stenosis measurements (when applicable) are obtained utilizing NASCET criteria, using the distal internal carotid diameter as the denominator. CONTRAST:  Reference EMR COMPARISON:  None. FINDINGS: CTA NECK FINDINGS Aortic arch: Atherosclerotic calcification.  No acute finding. Right carotid system: Atheromatous plaque at the brachiocephalic and common carotid. There is irregular mixed density plaque at the right ICA bulb with at least 75% stenosis. Left carotid system: Extensive mainly calcified plaque at the proximal internal carotid with stenosis measuring up to 50%. No ulceration or dissection. Vertebral arteries: Proximal subclavian atherosclerosis without flow limiting stenosis. Atheromatous left vertebral artery with occasional extrinsic narrowing from hypertrophic facets. Diminutive congenitally small right vertebral artery with stenosis at the origin, effectively occluded by the dura. Skeleton: Diffuse degenerative disease without acute finding. Other neck: No acute finding. Upper chest: No acute  finding.  Emphysema. Review of the MIP images confirms the above findings CTA HEAD FINDINGS Anterior circulation: Atherosclerotic plaque along the carotid siphons without suspected flow limiting stenosis. No large vessel occlusion or flow limiting stenosis. Right M4 branch calcification (correlating with calcification noted on prior noncontrast CT) would be an atypical location for atheromatous calcification and is most likely calcified embolus-chronicity uncertain. Negative for aneurysm. Posterior circulation: Left vertebral and basilar arteries are smooth and diffusely patent. Fetal type left PCA. Robust flow in bilateral posterior cerebral arteries. Venous sinuses: Limited  assessment by contrast timing. Anatomic variants: As above Delayed phase: Not obtained in the emergent setting Review of the MIP images confirms the above findings IMPRESSION: 1. Negative for emergent large vessel occlusion. 2. Right inferior division M4 branch calcification is likely a calcified embolus at this location, of indeterminate chronicity. 3. Severe proximal right ICA stenosis. The underlying plaque is irregular and could be an embolic source. 4. 50% atheromatous narrowing at the left proximal ICA. 5. Severe stenosis at the origin of the non dominant right vertebral artery. Right PICA flow is likely retrograde from the basilar. 6.  Emphysema (ICD10-J43.9). Electronically Signed   By: Marnee SpringJonathon  Watts M.D.   On: 12/18/2017 14:53   Ct Angio Neck W Or Wo Contrast  Result Date: 12/18/2017 CLINICAL DATA:  Left facial droop EXAM: CT ANGIOGRAPHY HEAD AND NECK TECHNIQUE: Multidetector CT imaging of the head and neck was performed using the standard protocol during bolus administration of intravenous contrast. Multiplanar CT image reconstructions and MIPs were obtained to evaluate the vascular anatomy. Carotid stenosis measurements (when applicable) are obtained utilizing NASCET criteria, using the distal internal carotid diameter as the denominator. CONTRAST:  Reference EMR COMPARISON:  None. FINDINGS: CTA NECK FINDINGS Aortic arch: Atherosclerotic calcification.  No acute finding. Right carotid system: Atheromatous plaque at the brachiocephalic and common carotid. There is irregular mixed density plaque at the right ICA bulb with at least 75% stenosis. Left carotid system: Extensive mainly calcified plaque at the proximal internal carotid with stenosis measuring up to 50%. No ulceration or dissection. Vertebral arteries: Proximal subclavian atherosclerosis without flow limiting stenosis. Atheromatous left vertebral artery with occasional extrinsic narrowing from hypertrophic facets. Diminutive congenitally  small right vertebral artery with stenosis at the origin, effectively occluded by the dura. Skeleton: Diffuse degenerative disease without acute finding. Other neck: No acute finding. Upper chest: No acute finding.  Emphysema. Review of the MIP images confirms the above findings CTA HEAD FINDINGS Anterior circulation: Atherosclerotic plaque along the carotid siphons without suspected flow limiting stenosis. No large vessel occlusion or flow limiting stenosis. Right M4 branch calcification (correlating with calcification noted on prior noncontrast CT) would be an atypical location for atheromatous calcification and is most likely calcified embolus-chronicity uncertain. Negative for aneurysm. Posterior circulation: Left vertebral and basilar arteries are smooth and diffusely patent. Fetal type left PCA. Robust flow in bilateral posterior cerebral arteries. Venous sinuses: Limited assessment by contrast timing. Anatomic variants: As above Delayed phase: Not obtained in the emergent setting Review of the MIP images confirms the above findings IMPRESSION: 1. Negative for emergent large vessel occlusion. 2. Right inferior division M4 branch calcification is likely a calcified embolus at this location, of indeterminate chronicity. 3. Severe proximal right ICA stenosis. The underlying plaque is irregular and could be an embolic source. 4. 50% atheromatous narrowing at the left proximal ICA. 5. Severe stenosis at the origin of the non dominant right vertebral artery. Right PICA flow is likely retrograde from the basilar.  6.  Emphysema (ICD10-J43.9). Electronically Signed   By: Marnee Spring M.D.   On: 12/18/2017 14:53   Mr Brain Wo Contrast  Result Date: 12/18/2017 CLINICAL DATA:  84 y/o  M; TIA, initial exam. EXAM: MRI HEAD WITHOUT CONTRAST TECHNIQUE: Multiplanar, multiecho pulse sequences of the brain and surrounding structures were obtained without intravenous contrast. COMPARISON:  12/18/2017 CT head and CTA head.  FINDINGS: Brain: No acute infarction, hemorrhage, hydrocephalus, extra-axial collection or mass lesion. Small chronic lacunar infarcts are present within the right putamen extending into corona radiata, left caudate head, and left caudate body. Several nonspecific foci of T2 FLAIR hyperintense signal abnormality in subcortical and periventricular white matter as well as pons are compatible with moderate chronic microvascular ischemic changes for age. Moderate brain parenchymal volume loss. Vascular: Normal flow voids. Skull and upper cervical spine: Normal marrow signal. Sinuses/Orbits: Negative. Other: None. IMPRESSION: 1. No acute intracranial abnormality. 2. Moderate chronic microvascular ischemic changes and parenchymal volume loss of the brain. 3. Small chronic lacunar infarcts of the basal ganglia. Electronically Signed   By: Mitzi Hansen M.D.   On: 12/18/2017 17:50   Ct Head Code Stroke Wo Contrast  Result Date: 12/18/2017 CLINICAL DATA:  Code stroke.  Left facial droop EXAM: CT HEAD WITHOUT CONTRAST TECHNIQUE: Contiguous axial images were obtained from the base of the skull through the vertex without intravenous contrast. COMPARISON:  None. FINDINGS: Brain: No evidence of acute infarction, hemorrhage, hydrocephalus, extra-axial collection or mass lesion/mass effect. Generalized atrophy. Well-defined remote appearing lateral lenticulostriate infarct on the right extending through putamen and corona radiata. There is a solitary calcification within a right parietal sulcus. Vascular: Atherosclerotic calcification.  No hyperdense vessel. Skull: No acute finding Sinuses/Orbits: Negative Other: These results were communicated to Dr. Amada Jupiter at 2:33 pmon 6/30/2019by text page via the [facility] messaging system. ASPECTS [facility] Stroke Program Early CT Score) - Ganglionic level infarction (caudate, lentiform nuclei, internal capsule, insula, M1-M3 cortex): 7 - Supraganglionic infarction (M4-M6  cortex): 3 Total score (0-10 with 10 being normal): 10 IMPRESSION: 1. Negative for hemorrhage or visible acute infarct.  ASPECTS is 10. 2. Remote appearing lateral lenticulostriate infarct on the right. 3. Low right parietal sulcal calcification, calcific embolus is a consideration in this setting. Electronically Signed   By: Marnee Spring M.D.   On: 12/18/2017 14:36   2D Echocardiogram  pending   PHYSICAL EXAM General - mild distress Heart - Regular rate and rhythm - no murmer Lungs - Clear to auscultation  Extremities - Distal pulses intact - no edema Skin - Warm and dry  Neurologic Examination:  Mental Status: Alert, oriented, thought content is slow, but appropriate.  Speech fluent without evidence of aphasia. No anomia. His speech is slow, but no dysarthria. Able to follow 3 step commands without difficulty. Cranial Nerves: II: Discs not visualized; Visual fields grossly normal, pupils equal, round, reactive to light III,IV, VI: ptosis not present, extra-ocular motions intact bilaterally V,VII: smile symmetric, facial light touch sensation normal bilaterally VIII: hearing normal bilaterally IX,X: gag reflex present XI: bilateral shoulder shrug XII: midline tongue extension Motor: RUE - 5/5                                            LUE - 5/5   RLE - 5/5  LLE - 5/5 Tone and bulk:normal tone throughout; no atrophy noted Sensory: Light touch intact throughout, bilaterally Deep Tendon Reflexes: 2+ and symmetric throughout Plantars: Right: downgoing                                Left: downgoing Cerebellar: normal finger-to-nose and normal heel-to-shin test Gait: deferred at this time.  No change in neurologic exam since yesterday  ASSESSMENT/PLAN Mr. Gary Lynn is a 84 y.o. male with history of right ICA stenosis, coronary artery disease status post stent in 2002, HLD, HTN, TB, chronic diastolic CHF, paroxysmal atrial  fibrillation, tobacco abuse presenting with transient left hemiparesis.   R brain TIA secondary to large vessel disease w/ R ICA stenosis  Code Stroke CT head No acute stroke. ASPECTS 10.  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  Low right parietal sulcal calcification (? calcified embolus).  CTA head & neck no ELVO.  Right M4 branch calcified embolus.  Severe proximal R ICA stenosis with irregular plaque.  Left proximal ICA 50%.  Severe stenosis origin R VA. R PICA retrograde from BA.  Emphysema.  MRI no acute stroke.Small vessel disease. Atrophy.  Old small basal ganglia lacunes.  2D Echo  pending   LDL 100  HgbA1c 7.1  VB12 409  Xarelto for VTE prophylaxis  Xarelto (rivaroxaban) daily prior to admission, now on clopidogrel 75 mg daily and Xarelto (rivaroxaban) daily. Given large vessel intracranial atherosclerosis, patient should be treated with xarelto and clopidogrel 75 mg orally every day x 3 months for secondary stroke prevention. After 3 months, change to xarelto alone. Long-term dual antiplatelets are contraindicated due to risk for intracerebral hemorrhage.   Therapy recommendations:  No therapy needs  Disposition:  Return home with wife Follow up stroke clinic in 4 weeks. Orders placed.  Carotid stenosis   Severe stenosis R ICA  Patient refuses consideration of surgical intervention  Discussed VVS consult with FP resident, will not pursue given pt refusal   atrial Fibrillation  Home anticoagulation:  Xarelto (rivaroxaban) daily continued in the hospital  Hypertension  Stable . BP goal normotensive  Hyperlipidemia  Home meds:  lipitor 40, increased to 80 in hospital  LDL 100, goal < 70  Continue statin at discharge  Diabetes type II  HgbA1c 7.1, goal < 7.0  Uncontrolled  Other Stroke Risk Factors  Advanced age  Cigarette smoker, advised to stop smoking  Coronary artery disease s/p stent 2002  Chronic diastolic congestive heart failure  Other  Active Problems  BPH  Hospital day # 1  Annie Main, MSN, APRN, ANVP-BC, AGPCNP-BC Advanced Practice Stroke Nurse [facility] See Amion for Schedule & Pager information 12/19/2017 2:55 PM   ATTENDING NOTE: I reviewed above note and agree with the assessment and plan. I have made any additions or clarifications directly to the above note.   84 year old male with history of right carotid stenosis, CAD, HLD, DM, HTN, proximal A. fib on Xarelto, smoker admitted for left-sided weakness.  Symptom resolved.  MRI negative for acute stroke.  CTA head and neck showed severe right ICA high-grade stenosis.  Left ICA 50% stenosis.  A1c 7.1 and LDL 100.  B12 156.  Patient event consistent with TIA, likely due to severe right ICA high-grade stenosis.  Discussed with patient regarding vascular surgery consultation for right CEA.  However, patient refused potential surgery, and also refused 2D echo examination.  Patient was discharged with Xarelto and  Plavix as well as Lipitor 80 and B12 supplement.  Patient was educated on smoking cessation.  Neurology will sign off. Please call with questions. Pt will follow up with stroke clinic NP at [facility] in about 4 weeks. Thanks for the consult.  Marvel Plan, MD PhD Stroke Neurology 12/19/2017 10:55 PM     To contact Stroke Continuity provider, please refer to [email or web address]. After hours, contact General Neurology

## 2017-12-19 NOTE — Care Management CC44 (Signed)
Condition Code 44 Documentation Completed  Patient Details  Name: Lequita HaltSamuel J Alleman MRN: [id number] Date of Birth: 25-Sep-1930   Condition Code 44 given:  Yes Patient signature on Condition Code 44 notice:  Yes Documentation of 2 MD's agreement:  Yes Code 44 added to claim:  Yes    Kermit BaloKelli F Adler Alton, RN 12/19/2017, 4:06 PM

## 2017-12-19 NOTE — Care Management Obs Status (Signed)
MEDICARE OBSERVATION STATUS NOTIFICATION   Patient Details  Name: Gary Lynn MRN: [id number] Date of Birth: Oct 19, 1930   Medicare Observation Status Notification Given:  Yes    Kermit BaloKelli F Manasa Spease, RN 12/19/2017, 4:06 PM

## 2018-01-09 ENCOUNTER — Telehealth: Payer: Self-pay | Admitting: Cardiovascular Disease

## 2018-01-09 NOTE — Telephone Encounter (Signed)
I spoke with the patient's wife.  She states that the patient was in the hospital about 2 weeks ago with a TIA. [facility]. She was calling today about his appointments. Our 1st available was 02/21/18 with Eula Listenyan Dunn, PA. While I was speaking with her she kept referring to an appt on Friday this week that her daughter had called to cancel about a follow up for a bleed on his brain. She said Dr. Dario GuardianJadali had told them they could [place] [facility]. I advised the only thing I see scheduled is for Wednesday 01/18/18 with the neurologist [facility]).  I advised that they should keep that appointment as a follow up to his TIA and recent hospitalization and they could determine if he needed to see cardiology any sooner.  I also advised if they are wanting to switch to a neurologist in [place] then they would need to touch base with Dr. Dario GuardianJadali about that. The patient's wife states she will speak with her daughter and they will call back if needed.

## 2018-01-09 NOTE — Telephone Encounter (Signed)
Spoke with patient wife and son States that the patient has been in the ED recently  Scheduled with R. Dunn on 9/3 but would like to seen sooner Please call to discuss

## 2018-01-16 ENCOUNTER — Ambulatory Visit: Payer: Medicare HMO | Admitting: Urology

## 2018-01-18 ENCOUNTER — Ambulatory Visit: Payer: Self-pay | Admitting: Adult Health

## 2018-02-21 ENCOUNTER — Ambulatory Visit: Payer: Medicare HMO | Admitting: Physician Assistant

## 2018-03-14 ENCOUNTER — Ambulatory Visit: Payer: Medicare HMO | Admitting: Physician Assistant

## 2018-03-14 ENCOUNTER — Encounter: Payer: Self-pay | Admitting: Physician Assistant

## 2018-03-14 VITALS — BP 140/60 | HR 80 | Ht 63.0 in | Wt 138.5 lb

## 2018-03-14 DIAGNOSIS — I10 Essential (primary) hypertension: Secondary | ICD-10-CM | POA: Diagnosis not present

## 2018-03-14 DIAGNOSIS — E785 Hyperlipidemia, unspecified: Secondary | ICD-10-CM

## 2018-03-14 DIAGNOSIS — I480 Paroxysmal atrial fibrillation: Secondary | ICD-10-CM | POA: Diagnosis not present

## 2018-03-14 DIAGNOSIS — I2510 Atherosclerotic heart disease of native coronary artery without angina pectoris: Secondary | ICD-10-CM

## 2018-03-14 DIAGNOSIS — Z79899 Other long term (current) drug therapy: Secondary | ICD-10-CM

## 2018-03-14 DIAGNOSIS — I779 Disorder of arteries and arterioles, unspecified: Secondary | ICD-10-CM | POA: Diagnosis not present

## 2018-03-14 DIAGNOSIS — I951 Orthostatic hypotension: Secondary | ICD-10-CM

## 2018-03-14 DIAGNOSIS — G459 Transient cerebral ischemic attack, unspecified: Secondary | ICD-10-CM | POA: Diagnosis not present

## 2018-03-14 DIAGNOSIS — I739 Peripheral vascular disease, unspecified: Secondary | ICD-10-CM

## 2018-03-14 NOTE — Patient Instructions (Addendum)
Medication Instructions:  Your physician recommends that you continue on your current medications as directed. Please refer to the Current Medication list given to you today.   Labwork: Your physician recommends that you return for lab work in: TODAY- CBC, BMET.   Testing/Procedures: none  Follow-Up: Your physician recommends that you schedule a follow-up appointment in: 6 MONTHS WITH DR ARIDA.   Please increase your intake of fluids mainly water.   If you need a refill on your cardiac medications before your next appointment, please call your pharmacy.

## 2018-03-14 NOTE — Progress Notes (Signed)
Cardiology Office Note Date:  03/14/2018  Patient ID:  Gary, Lynn 06/06/1931, MRN [id number] PCP:  Sherrie Mustache, MD (Inactive)  Cardiologist:  Dr. Kirke Corin, MD    Chief Complaint: Follow up  History of Present Illness: Gary Lynn is a 84 y.o. male with history of CAD s/p prior stenting of the RCA years ago, PAF/flutter diagnosed by [facility] patch monitoring in 05/2016, chronic diastolic CHF, asymptomatic PVCs, dizziness with activities, HTN, HLD, DM2, and carotid artery disease presents for follow up of his CAD and Afib.  Patient's most recent cardiac cath in 07/2011 showed an occluded RCA with left to right collaterals, high-grade complex disease in OM1 which was known to be chronic, and had a normal EF. He was medically managed. Most recent nuclear stress test in 2016 showed moderate to large inferior septal and inferolateral fixed defect with mild reversibility. EF was normal. It was not much different from study in 2014. He was seen in 11/17 for dizziness that he stated began sometime in the summer and was initially episodic with ambulation. He underwent Zio patch monitoring that showed a predominant rhyhtm of sinus with intermittent episodes of Afib/flutter with the longest episode being 6 hours and 51 minutes with an average heart rate of 114 bpm. Occasional PACs and PVCs. Echo on 06/01/16 showed EF 55-60%, AK of the basal-midinferolateral and inferior myocardium (c/w prior study), GR1DD, aortic sclerosis without stenosis, mild MR, PASP 40 mmHg. Carotid doppler on 12/17 showed 40-59% RICA stenosis (high end of range), 1-39% LICA stenosis (high end of range), patent vertebral arteries with antegrade flow, and normal subclavian arteries bilaterally. Given patient had been on DAPT with ASA and Plavix case was discussed with Dr. Kirke Corin regarding medication management prior to starting DOAC with recommendation to start DOAC and stop DAPT. He was unable to afford Xarelto, and self-resumed Plavix.  He has continued to note orthostatic dizziness with several falls. Risks of ongoing anticoagulation were felt to outweigh benefits and he has been maintained off OAC. He was seen in the ED in 11/2017 with hypertension with BP 200s/100s. Metoprolol was changed to Coreg and outpatient follow up was advised. He was admitted to the hospital in late 11/2017 with a TIA. CTA showed right ICA stenosis >75% and L ICA stenosis at 50%. MRI brain showed no acute infarct. Patient and family declined vascular surgery evaluation and echo. He was advised to take PTA Xarelto and Plavix for 3 months (per neurology) and Lipitor was increased to 80 mg daily. Per discharge medication list, he was advised to take triple therapy with ASA, Plavix, and Xarelto 20 mg (calculated CrCl from his discharge labs/weight is 40 mL/min). He has followed up with neurology in [place], establishing with them on 03/13/2018. At that time, his medication list included Xarelto 15 mg daily.    Labs: 01/06/2018: LDL 39, A1c 7.1, WBC 10.1, HGB 10.4, PLT 165 (done at PCP office) 12/19/2017: Mg++ 1.4, Na+ 132, K+ 3.3, SCr 1.22 12/18/2017: TSH 3.169   He is accompanied by his wife and daughter. He comes in today taking Xarelto 20 mg daily. He reports stopping Plavix in hospital follow up in mid July, 2019. He continues to note postional dizziness. His PO intake (food and water) is minimal per family. BP at home "is good." No chest pain, SOB, palpitations, orthopnea, lower extremity swelling, early satiety, or PND. No syncope or falls since he was last seen. He will need to have multiple dental extractions in the near future. Neurology  has advised this be postponed for 6 months following his TIA. He will need to hold his Xarelto for these extractions.   Past Medical History:  Diagnosis Date  . Carotid artery stenosis    a. carotid doppler 05/2016: RICA 40-59%, LICA 1-39%  . Chronic diastolic CHF (congestive heart failure) (HCC)    a. echo 05/2016: EF  55-60%, AK of the basal-midinferolateral and inferior myocardium (c/w prior study), GR1DD, aortic sclerosis without stenosis, mild MR, PASP 40 mmHg  . Coronary artery disease    Cardiac cath in February of 2013: EF 60%, high-grade chronic disease in OM 1, occluded mid RCA at the site of a previously placed stent with left-to-right collaterals, occluded left SFA.  . Diabetes mellitus with complication (HCC)   . Hypercholesterolemia   . Hypertension   . PAF (paroxysmal atrial fibrillation) (HCC)    a. Zio monitor 05/2016: predominant rhyhtm of sinus with intermittent episodes of Afib/flutter with the longest episode being 6 hours and 51 minutes with an average heart rate of 114 bpm. Occasional PACs and PVCs. b. CHADS2VASc --> 6 (CHF, HTN, age x 2, DM, vascular disease)    Past Surgical History:  Procedure Laterality Date  . BACK SURGERY     years ago  . CARDIAC CATHETERIZATION  07/2007   [facility]  . CARDIAC CATHETERIZATION  07/2011   [facility]  . CORONARY ANGIOPLASTY WITH STENT PLACEMENT      Current Meds  Medication Sig  . atorvastatin (LIPITOR) 80 MG tablet Take 1 tablet (80 mg total) by mouth daily at 6 PM.  . carvedilol (COREG) 12.5 MG tablet Take 1 tablet (12.5 mg total) by mouth 2 (two) times daily.  . ferrous sulfate 325 (65 FE) MG tablet Take 325 mg by mouth daily.   . finasteride (PROSCAR) 5 MG tablet Take 1 tablet (5 mg total) by mouth daily.  . fish oil-omega-3 fatty acids 1000 MG capsule Take 1 g by mouth 2 (two) times daily.   Marland Kitchen glipiZIDE (GLUCOTROL XL) 5 MG 24 hr tablet Take 5 mg by mouth daily before supper.   . linagliptin (TRADJENTA) 5 MG TABS tablet Take 5 mg by mouth daily before supper.   Marland Kitchen lisinopril (PRINIVIL,ZESTRIL) 10 MG tablet Take 20 mg by mouth 2 (two) times daily before a meal.   . magnesium oxide (MAG-OX) 400 MG tablet Take 1 tablet (400 mg total) by mouth daily.  . metFORMIN (GLUCOPHAGE) 1000 MG tablet Take 1,000 mg by mouth 2 (two) times daily before a meal.   .  metoprolol tartrate (LOPRESSOR) 25 MG tablet Take 25 mg by mouth 2 (two) times daily.   Gary Lynn test strip   . ranitidine (ZANTAC) 150 MG tablet Take 1 tablet (150 mg total) by mouth daily. (Patient taking differently: Take 150 mg by mouth daily as needed for heartburn. )  . tamsulosin (FLOMAX) 0.4 MG CAPS capsule Take 1 capsule (0.4 mg total) by mouth daily. (Patient taking differently: Take 0.4 mg by mouth daily before supper. )  . XARELTO 20 MG TABS tablet Take 20 mg by mouth daily.     Allergies:   Patient has no known allergies.   Social History:  The patient  reports that he has been smoking cigarettes. He has a 25.00 pack-year smoking history. He has never used smokeless tobacco. He reports that he does not drink alcohol or use drugs.   Family History:  The patient's family history includes Dementia in his mother; Heart attack in his father.  ROS:   Review of Systems  Constitutional: Positive for malaise/fatigue. Negative for chills, diaphoresis, fever and weight loss.  HENT: Negative for congestion.   Eyes: Negative for discharge and redness.  Respiratory: Negative for cough, hemoptysis, sputum production, shortness of breath and wheezing.   Cardiovascular: Negative for chest pain, palpitations, orthopnea, claudication, leg swelling and PND.  Gastrointestinal: Negative for abdominal pain, blood in stool, heartburn, melena, nausea and vomiting.  Genitourinary: Negative for hematuria.  Musculoskeletal: Negative for falls and myalgias.  Skin: Negative for rash.  Neurological: Positive for dizziness and weakness. Negative for tingling, tremors, sensory change, speech change, focal weakness, loss of consciousness and headaches.  Endo/Heme/Allergies: Does not bruise/bleed easily.  Psychiatric/Behavioral: Negative for substance abuse. The patient is not nervous/anxious.   All other systems reviewed and are negative.    PHYSICAL EXAM:  VS:  BP 140/60 (BP Location: Left Arm,  Patient Position: Sitting, Cuff Size: Normal)   Pulse 80   Ht 5\' 3"  (1.6 m)   Wt 138 lb 8 oz (62.8 kg)   BMI 24.53 kg/m  BMI: Body mass index is 24.53 kg/m.  Physical Exam  Constitutional: He is oriented to person, place, and time. He appears well-developed and well-nourished.  HENT:  Head: Normocephalic and atraumatic.  Eyes: Right eye exhibits no discharge. Left eye exhibits no discharge.  Neck: Normal range of motion. No JVD present.  Cardiovascular: Normal rate, regular rhythm, S1 normal, S2 normal and normal heart sounds. Exam reveals no distant heart sounds, no friction rub, no midsystolic click and no opening snap.  No murmur heard. Pulses:      Posterior tibial pulses are 1+ on the right side, and 1+ on the left side.  Pulmonary/Chest: Effort normal and breath sounds normal. No respiratory distress. He has no decreased breath sounds. He has no wheezes. He has no rales. He exhibits no tenderness.  Abdominal: Soft. He exhibits no distension. There is no tenderness.  Musculoskeletal: He exhibits no edema.  Neurological: He is alert and oriented to person, place, and time.  Skin: Skin is warm and dry. No cyanosis. Nails show no clubbing.  Psychiatric: He has a normal mood and affect. His speech is normal and behavior is normal. Judgment and thought content normal.     EKG:  Was ordered and interpreted by me today. 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   Wt Readings from Last 3 Encounters:  03/14/18 138 lb 8 oz (62.8 kg)  12/18/17 141 lb 5 oz (64.1 kg)  12/15/17 140 lb  (63.5 kg)     Other studies reviewed: Additional studies/records reviewed today include: summarized above  ASSESSMENT AND PLAN:  1. CAD involving the native coronary arteries without angina: No chest pain. On Xarelto in place of antiplatelet. No plans for ischemic evaluation at this time.    2. PAF: Currently in sinus rhythm. Continue Coreg 12.5 mg bid for rate control. Continue Xarelto for full dose anticoagulation (dosing to be determined based on bmet drawn today).   3. Carotid artery disease: Continues to decline vascular surgery evaluation given poor outcome noted in his brother. He  and the family are aware of the risk without possible intervention. They continue to prefer medical, conservative management. Optimal BP/HR/lipid control. Remains on Lipitor as below with a goal LDL < 70.   4. TIA: Initially recommended to be on Xarelto and Plavix x 3 months per neurology in [facility]. It appears he has only been on Xarelto 20 mg daily since mid 12/2017. Has followed up with outpatient neurology on 9/23 and remains on only Xarelto. Agree with neruology recommendations that he should wait at least 6 months from the date of his TIA prior to undergoing dental procedure. Optimal BP and lipid control.    5. Orthostatic hypotension: Orthostatic vital signs were positive today in the office today. Given his issues with hypertension as well (180s systolic at neurology on 9/23) we will keep his medications at the current dosing. He has been advised to increase water intake.   6. HTN: Blood pressure is well controlled today. Continue current medications.   7. HLD: Most recent LDL of 39 from 01/06/2018. Goal LDL < 70. Remains on Lipitor 80 mg daily.   8. Medication management: Based on the patient's CrCl from 7/1/12019 (most recent bmet), he should be on Xarelto 15 mg q dinner. Check bmet today to dose his Xarelto moving forward. With regards to his needed dental extractions, he should hold Xarelto for 2  days prior to his procedure with recommendation to resume OAC as soon as safely felt to be possible by the treating team in an effort to minimize his stroke risk. This information was discussed with the patient and his family in detail today. I would not recommend holding Xarelto for 5 days prior to dental extractions.   Disposition: F/u with Dr. Kirke Corin or an APP in 6 months, sooner if needed.   Current medicines are reviewed at length with the patient today.  The patient did not have any concerns regarding medicines.  Signed, Eula Listen, PA-C 03/14/2018 4:41 PM     [facility] [place] [address] [place], [place] [address] [phone]

## 2018-03-15 ENCOUNTER — Telehealth: Payer: Self-pay | Admitting: *Deleted

## 2018-03-15 LAB — CBC WITH DIFFERENTIAL/PLATELET
BASOS: 0 %
Basophils Absolute: 0 10*3/uL (ref 0.0–0.2)
EOS (ABSOLUTE): 0.2 10*3/uL (ref 0.0–0.4)
Eos: 3 %
HEMOGLOBIN: 12 g/dL — AB (ref 13.0–17.7)
Hematocrit: 34.9 % — ABNORMAL LOW (ref 37.5–51.0)
Immature Grans (Abs): 0.1 10*3/uL (ref 0.0–0.1)
Immature Granulocytes: 1 %
Lymphocytes Absolute: 1.7 10*3/uL (ref 0.7–3.1)
Lymphs: 21 %
MCH: 30.7 pg (ref 26.6–33.0)
MCHC: 34.4 g/dL (ref 31.5–35.7)
MCV: 89 fL (ref 79–97)
MONOCYTES: 10 %
Monocytes Absolute: 0.8 10*3/uL (ref 0.1–0.9)
NEUTROS ABS: 5.2 10*3/uL (ref 1.4–7.0)
Neutrophils: 65 %
Platelets: 183 10*3/uL (ref 150–450)
RBC: 3.91 x10E6/uL — ABNORMAL LOW (ref 4.14–5.80)
RDW: 13.9 % (ref 12.3–15.4)
WBC: 8.1 10*3/uL (ref 3.4–10.8)

## 2018-03-15 LAB — BASIC METABOLIC PANEL
BUN / CREAT RATIO: 16 (ref 10–24)
BUN: 16 mg/dL (ref 8–27)
CALCIUM: 9.1 mg/dL (ref 8.6–10.2)
CO2: 22 mmol/L (ref 20–29)
Chloride: 91 mmol/L — ABNORMAL LOW (ref 96–106)
Creatinine, Ser: 1.03 mg/dL (ref 0.76–1.27)
GFR, EST AFRICAN AMERICAN: 77 mL/min/{1.73_m2} (ref >59)
GFR, EST NON AFRICAN AMERICAN: 66 mL/min/{1.73_m2} (ref >59)
Glucose: 94 mg/dL (ref 65–99)
POTASSIUM: 4.1 mmol/L (ref 3.5–5.2)
Sodium: 130 mmol/L — ABNORMAL LOW (ref 134–144)

## 2018-03-15 MED ORDER — RIVAROXABAN 15 MG PO TABS: 15.0000 mg | ORAL_TABLET | Freq: Every day | ORAL | 3 refills | Status: DC

## 2018-03-15 NOTE — Telephone Encounter (Signed)
S/w patient and wife. They verbalized understanding of results, to stop the xarelto 20 mg and take xarelto 15 mg by mouth once a day with dinner. Rx sent to pharmacy.

## 2018-03-15 NOTE — Telephone Encounter (Signed)
-----   Message from Sondra Barges, PA-C sent at 03/15/2018  8:51 AM EDT ----- Renal function is normal. Potassium is at goal. Sodium low, though this seems to be somewhat chronic for him.  CrCl 47.42 mL/min.  Please call in Xarelto 15 mg q dinner. He needs to stop taking Xarelto 20 mg q dinner and take the 15 mg tab instead.  HGB low, though stable.

## 2018-05-24 ENCOUNTER — Ambulatory Visit (INDEPENDENT_AMBULATORY_CARE_PROVIDER_SITE_OTHER): Payer: Medicare HMO

## 2018-05-24 DIAGNOSIS — I6529 Occlusion and stenosis of unspecified carotid artery: Secondary | ICD-10-CM | POA: Diagnosis not present

## 2018-05-26 ENCOUNTER — Other Ambulatory Visit: Payer: Self-pay | Admitting: *Deleted

## 2018-05-26 DIAGNOSIS — I6529 Occlusion and stenosis of unspecified carotid artery: Secondary | ICD-10-CM

## 2018-10-19 ENCOUNTER — Encounter: Payer: Self-pay | Admitting: Cardiovascular Disease

## 2018-11-22 ENCOUNTER — Telehealth: Payer: Self-pay

## 2018-11-22 NOTE — Progress Notes (Signed)
Virtual Visit via Telephone Note   This visit type was conducted due to national recommendations for restrictions regarding the COVID-19 Pandemic (e.g. social distancing) in an effort to limit this patient's exposure and mitigate transmission in our community.  Due to his co-morbid illnesses, this patient is at least at moderate risk for complications without adequate follow up.  This format is felt to be most appropriate for this patient at this time.  The patient did not have access to video technology/had technical difficulties with video requiring transitioning to audio format only (telephone).  All issues noted in this document were discussed and addressed.  No physical exam could be performed with this format.  Please refer to the patient's chart for his  consent to telehealth for [facility].   Date:  11/24/2018   ID:  Gary Lynn, DOB [birth year], MRN [id number]  Patient Location: Home Provider Location: Home  PCP:  Sherrie MustacheJadali, Fayegh, MD  Cardiologist:  Lorine BearsMuhammad Arida, MD  Electrophysiologist:  None   Evaluation Performed:  Follow-Up Visit  Chief Complaint:  Follow up  History of Present Illness:    Gary Lynn is a 85 y.o. male with history of CAD s/p prior stenting of the RCA years ago, [facility], HFpEF, TIA, asymptomatic PVCs, orthostatic dizziness, DM2, HTN, HLD, and carotid artery disease presents for follow up of his CAD, HFpEF, and Afib.  Patient's most recent cardiac cath in 07/2011 showed an occluded RCA with left-to-right collaterals, high-grade complex disease in OM1 which was known to be chronic, and with normal EF. He was medically managed. Most recent nuclear stress test in 2016 showed moderate to large inferior septal and inferolateral fixed defect with mild reversibility. EF was normal. It was not much different from study in 2014. He was seen in 04/2016 for dizziness that he stated began sometime in the summer and  was initially episodic with ambulation. He underwent Zio patch monitoring that showed a predominant rhyhtm of sinus with intermittent episodes of Afib/flutter with the longest episode being 6 hours and 51 minutes with an average heart rate of 114 bpm. Occasional PACs and PVCs. Echo on 06/01/16 showed EF 55-60%, AK of the basal-midinferolateral and inferior myocardium (c/w prior study), Gr1DD, aortic sclerosis without stenosis, mild MR, PASP 40 mmHg. Carotid doppler in 12/17 showed 40-59% RICA stenosis (high end of range), 1-39% LICA stenosis (high end of range), patent vertebral arteries with antegrade flow, and normal subclavian arteries bilaterally. Given patient had been on DAPT with ASA and Plavix case was discussed with Dr. Kirke CorinArida regarding medication management prior to starting DOAC with recommendation to start DOAC and stop DAPT. He was unable to afford Xarelto, and self-resumed Plavix. He continued to note orthostatic dizziness with several falls. Risks of ongoing anticoagulation were felt to outweigh benefits. He was seen in the ED in 11/2017 with hypertension with BP 200s/100s. Metoprolol was changed to Coreg and outpatient follow up was advised. He was admitted to the hospital in late 11/2017 with a TIA. CTA showed right ICA stenosis >75% and left ICA stenosis at 50%. MRI brain showed no acute infarct. Patient and family declined vascular surgery evaluation and echo. He was advised to take PTA Xarelto and Plavix for 3 months (per neurology) and Lipitor was increased to 80 mg daily. Per discharge medication list, he was advised to take triple therapy with ASA, Plavix, and Xarelto. He was seen by neurology and advised to take Xarelto. He was last seen  our office in 02/2018 and was tolerating Xarelto. He continued to note positional dizziness with poor PO intake noted. Given his CrCl was calculated at 47.42 mL/min, his Xarelto was decreased to 15 mg. Most recent carotid artery ultrasound from 05/2018 showed  stable RICA 40-59% stenosis and LICA 1-39% stenosis.   He is seen in telemedicine this morning and is doing well. No chest pain, SOB, palpitations, presyncope, or syncope. His lower extremity swelling is stable. He has stable 2-pillow orthopnea. No early satiety. He continues to note mild positional dizziness and stands slowly because of this. No falls since he was last seen. BP runs in the 140s to 160s systolic. He denies any BRBPR, though has noted dark stools for the past year. He state blood work was recently checked by his PCP. He does not have any concerns at this time.    Labs: 02/2018 - HGB 12.0, SCr 1.03, K+ 4.1 01/06/2018: magnesium 1.4, LDL 39, A1c 7.1 12/18/2017: TSH 3.169   The patient does not have symptoms concerning for COVID-19 infection (fever, chills, cough, or new shortness of breath).    Past Medical History:  Diagnosis Date  . Carotid artery stenosis    a. carotid doppler 05/2016: RICA 40-59%, LICA 1-39%  . Chronic diastolic CHF (congestive heart failure) (HCC)    a. echo 05/2016: EF 55-60%, AK of the basal-midinferolateral and inferior myocardium (c/w prior study), GR1DD, aortic sclerosis without stenosis, mild MR, PASP 40 mmHg  . Coronary artery disease    Cardiac cath in February of 2013: EF 60%, high-grade chronic disease in OM 1, occluded mid RCA at the site of a previously placed stent with left-to-right collaterals, occluded left SFA.  . Diabetes mellitus with complication (HCC)   . Hypercholesterolemia   . Hypertension   . PAF (paroxysmal atrial fibrillation) (HCC)    a. Zio monitor 05/2016: predominant rhyhtm of sinus with intermittent episodes of Afib/flutter with the longest episode being 6 hours and 51 minutes with an average heart rate of 114 bpm. Occasional PACs and PVCs. b. CHADS2VASc --> 6 (CHF, HTN, age x 2, DM, vascular disease)   Past Surgical History:  Procedure Laterality Date  . BACK SURGERY     years ago  . CARDIAC CATHETERIZATION  07/2007    [facility]  . CARDIAC CATHETERIZATION  07/2011   [facility]  . CORONARY ANGIOPLASTY WITH STENT PLACEMENT       Current Meds  Medication Sig  . amLODipine (NORVASC) 5 MG tablet Take 5 mg by mouth daily.  Marland Kitchen atorvastatin (LIPITOR) 80 MG tablet Take 1 tablet (80 mg total) by mouth daily at 6 PM. (Patient taking differently: Take 40 mg by mouth daily at 6 PM. )  . ferrous sulfate 325 (65 FE) MG tablet Take 325 mg by mouth daily.   . finasteride (PROSCAR) 5 MG tablet Take 1 tablet (5 mg total) by mouth daily.  . fish oil-omega-3 fatty acids 1000 MG capsule Take 1 g by mouth 2 (two) times daily.   Marland Kitchen glipiZIDE (GLUCOTROL XL) 5 MG 24 hr tablet Take 5 mg by mouth daily before supper.   . linagliptin (TRADJENTA) 5 MG TABS tablet Take 5 mg by mouth daily before supper.   Marland Kitchen lisinopril (PRINIVIL,ZESTRIL) 10 MG tablet Take 20 mg by mouth 2 (two) times daily before a meal.   . Magnesium 250 MG TABS Take 250 mg by mouth daily.  Letta Pate VERIO test strip   . Rivaroxaban (XARELTO) 15 MG TABS tablet Take 1 tablet (  15 mg total) by mouth daily with supper.  . tamsulosin (FLOMAX) 0.4 MG CAPS capsule Take 1 capsule (0.4 mg total) by mouth daily. (Patient taking differently: Take 0.4 mg by mouth daily before supper. )     Allergies:   Patient has no known allergies.   Social History   Tobacco Use  . Smoking status: Current Every Day Smoker    Packs/day: 0.50    Years: 50.00    Pack years: 25.00    Types: Cigarettes  . Smokeless tobacco: Never Used  . Tobacco comment: 1 pack per 3days  Substance Use Topics  . Alcohol use: No    Alcohol/week: 0.0 standard drinks  . Drug use: No     Family Hx: The patient's family history includes Dementia in his mother; Heart attack in his father. There is no history of Kidney disease or Prostate cancer.  ROS:   Please see the history of present illness.     All other systems reviewed and are negative.   Prior CV studies:   The following studies were reviewed today:   2D Echo 05/2016: - Left ventricle: The cavity size was normal. Wall thickness was increased in a pattern of mild LVH. Systolic function was normal. The estimated ejection fraction was in the range of 55% to 60%. There is akinesis of the basal-midinferolateral and inferior myocardium. Doppler parameters are consistent with abnormal left ventricular relaxation (grade 1 diastolic dysfunction). - Aortic valve: Probably trileaflet; moderately thickened leaflets.   Cusp separation was mildly reduced. Sclerosis without stenosis. - Mitral valve: There was mild regurgitation. - Right ventricle: The cavity size was at the upper limits of   normal. Wall thickness was normal. Systolic function was normal. - Pulmonary arteries: Systolic pressure was mildly increased. PA peak pressure: 40 mm Hg (S). __________  Cardiac monitoring 04/2016: Predominant rhythm was sinus with first-degree AV block. Intermittent episodes of atrial fibrillation and flutter with rapid ventricular response with average heart rate of 114 bpm. The longest episode lasted 6 hours and 51 minutes. Frequent PACs and occasional PVCs were noted. __________  [facility] 2016: Moderate to large inferior septal and inferolateral fixed defect with mild reversibility, normal EF, not much different from prior stress test in 2014. __________  [facility] 2013: Mid LAD 40%, D1 40%, mid LCx 60%, OM1 80%, proximal RCA 95%, mid RCA supplied by collaterals from the distal LCx with diffuse 100% stenosis at the site of a prior stent, EF 60%. He was incidentally noted to have 100% distal left SFA stenosis that was medically managed.   Labs/Other Tests and Data Reviewed:    EKG:  No ECG reviewed.  Recent Labs: 12/18/2017: ALT 10; TSH 3.169 12/19/2017: Magnesium 1.4 03/14/2018: BUN 16; Creatinine, Ser 1.03; Hemoglobin 12.0; Platelets 183; Potassium 4.1; Sodium 130   Recent Lipid Panel Lab Results  Component Value Date/Time   CHOL 163 12/18/2017 06:18 PM   TRIG  158 (H) 12/18/2017 06:18 PM   HDL 31 (L) 12/18/2017 06:18 PM   CHOLHDL 5.3 12/18/2017 06:18 PM   LDLCALC 100 (H) 12/18/2017 06:18 PM    Wt Readings from Last 3 Encounters:  11/24/18 145 lb (65.8 kg)  03/14/18 138 lb 8 oz (62.8 kg)  12/18/17 141 lb 5 oz (64.1 kg)     Objective:    Vital Signs:  Ht  (1.6 m)   Wt 145 lb (65.8 kg)   BMI 25.69 kg/m    VITAL SIGNS:  reviewed  ASSESSMENT & PLAN:  1. CAD involving the native coronary arteries without angina: He is doing well without any symptoms concerning for angina. On Xarelto in place of ASA. No plans for ischemic evaluation at this time.   2. PAF: Ventricular rate is well controlled by BP from 11/23/2018 if 80 bpm. No symptoms of increased Afib burden. Not currently on any rate controlling therapy. It is unclear when his Coreg was stopped. Perhaps this was stopped in the setting of orthostatic hypotension by outside provider. Remains on Xarelto, renally dosed. Attempt to obtain recent BMP and CBC from PCP's office.   3. Carotid artery disease: Stable on recent ultrasound. Due for follow up in 05/2019. Continues to decline vascular surgery evaluation. Optimal BP/HR/lipid control.   4. Dark stools: Obtain recent CBC as above. Follow up with PCP.   5. TIA: Followed by neurology. Remains on Xarelto as above. Continue Lipitor.   6. Orthostatic hypotension: Unchanged. Continue to change positions slowly. No longer on Coreg as above. If needed for rate control, could stop amlodipine and place back on Coreg vs metoprolol. This too would assist with his mild venous insufficiency.   7. HLD: Most recent LDL at goal as above. Continue Lipitor.   COVID-19 Education: The signs and symptoms of COVID-19 were discussed with the patient and how to seek care for testing (follow up with PCP or arrange E-visit).  The importance of social distancing was discussed today.  Time:   Today, I have spent 10 minutes with the patient with telehealth  technology discussing the above problems.     Medication Adjustments/Labs and Tests Ordered: Current medicines are reviewed at length with the patient today.  Concerns regarding medicines are outlined above.   Tests Ordered: No orders of the defined types were placed in this encounter.   Medication Changes: No orders of the defined types were placed in this encounter.   Disposition:  Follow up in 6 month(s)  Signed, Eula Listen, PA-C  11/24/2018 9:16 AM    [facility]

## 2018-11-22 NOTE — Telephone Encounter (Signed)
Virtual Visit Pre-Appointment Phone Call  "Gary Lynn, I am calling you today to discuss your upcoming appointment. We are currently trying to limit exposure to the virus that causes COVID-19 by seeing patients at home rather than in the office."  1. "What is the BEST phone number to call the day of the visit?" - include this in appointment notes  2. Do you have or have access to (through a family member/friend) a smartphone with video capability that we can use for your visit?" a. If yes - list this number in appt notes as cell (if different from BEST phone #) and list the appointment type as a VIDEO visit in appointment notes b. If no - list the appointment type as a PHONE visit in appointment notes  3. Confirm consent - "In the setting of the current Covid19 crisis, you are scheduled for a phone visit with your provider on 11/24/2018 at 9:00AM.  Just as we do with many in-office visits, in order for you to participate in this visit, we must obtain consent.  If you'd like, I can send this to your mychart (if signed up) or email for you to review.  Otherwise, I can obtain your verbal consent now.  All virtual visits are billed to your insurance company just like a normal visit would be.  By agreeing to a virtual visit, we'd like you to understand that the technology does not allow for your provider to perform an examination, and thus may limit your provider's ability to fully assess your condition. If your provider identifies any concerns that need to be evaluated in person, we will make arrangements to do so.  Finally, though the technology is pretty good, we cannot assure that it will always work on either your or our end, and in the setting of a video visit, we may have to convert it to a phone-only visit.  In either situation, we cannot ensure that we have a secure connection.  Are you willing to proceed?" STAFF: Did the patient verbally acknowledge consent to telehealth visit? Document YES/NO  here: YES PER WIFE LUCILLE   4. Advise patient to be prepared - "Two hours prior to your appointment, go ahead and check your blood pressure, pulse, oxygen saturation, and your weight (if you have the equipment to check those) and write them all down. When your visit starts, your provider will ask you for this information. If you have an Apple Watch or Kardia device, please plan to have heart rate information ready on the day of your appointment. Please have a pen and paper handy nearby the day of the visit as well."  5. Give patient instructions for MyChart download to smartphone OR [email or web address] as below if video visit (depending on what platform provider is using)  6. Inform patient they will receive a phone call 15 minutes prior to their appointment time (may be from unknown caller ID) so they should be prepared to answer    TELEPHONE CALL NOTE  Gary Lynn has been deemed a candidate for a follow-up tele-health visit to limit community exposure during the Covid-19 pandemic. I spoke with the patient via phone to ensure availability of phone/video source, confirm preferred email & phone number, and discuss instructions and expectations.  I reminded Gary Lynn to be prepared with any vital sign and/or heart rhythm information that could potentially be obtained via home monitoring, at the time of his visit. I reminded Gary Lynn to expect a phone call  prior to his visit.  Tommie Sams McClain 11/22/2018 9:15 AM    FULL LENGTH CONSENT FOR TELE-HEALTH VISIT   I hereby voluntarily request, consent and authorize [facility] and its employed or contracted physicians, physician assistants, nurse practitioners or other licensed health care professionals (the Practitioner), to provide me with telemedicine health care services (the Services") as deemed necessary by the treating Practitioner. I acknowledge and consent to receive the Services by the Practitioner via telemedicine. I  understand that the telemedicine visit will involve communicating with the Practitioner through live audiovisual communication technology and the disclosure of certain medical information by electronic transmission. I acknowledge that I have been given the opportunity to request an in-person assessment or other available alternative prior to the telemedicine visit and am voluntarily participating in the telemedicine visit.  I understand that I have the right to withhold or withdraw my consent to the use of telemedicine in the course of my care at any time, without affecting my right to future care or treatment, and that the Practitioner or I may terminate the telemedicine visit at any time. I understand that I have the right to inspect all information obtained and/or recorded in the course of the telemedicine visit and may receive copies of available information for a reasonable fee.  I understand that some of the potential risks of receiving the Services via telemedicine include:   Delay or interruption in medical evaluation due to technological equipment failure or disruption;  Information transmitted may not be sufficient (e.g. poor resolution of images) to allow for appropriate medical decision making by the Practitioner; and/or   In rare instances, security protocols could fail, causing a breach of personal health information.  Furthermore, I acknowledge that it is my responsibility to provide information about my medical history, conditions and care that is complete and accurate to the best of my ability. I acknowledge that Practitioner's advice, recommendations, and/or decision may be based on factors not within their control, such as incomplete or inaccurate data provided by me or distortions of diagnostic images or specimens that may result from electronic transmissions. I understand that the practice of medicine is not an exact science and that Practitioner makes no warranties or guarantees  regarding treatment outcomes. I acknowledge that I will receive a copy of this consent concurrently upon execution via email to the email address I last provided but may also request a printed copy by calling the office of [facility].    I understand that my insurance will be billed for this visit.   I have read or had this consent read to me.  I understand the contents of this consent, which adequately explains the benefits and risks of the Services being provided via telemedicine.   I have been provided ample opportunity to ask questions regarding this consent and the Services and have had my questions answered to my satisfaction.  I give my informed consent for the services to be provided through the use of telemedicine in my medical care  By participating in this telemedicine visit I agree to the above.

## 2018-11-24 ENCOUNTER — Telehealth (INDEPENDENT_AMBULATORY_CARE_PROVIDER_SITE_OTHER): Payer: Medicare HMO | Admitting: Physician Assistant

## 2018-11-24 ENCOUNTER — Other Ambulatory Visit: Payer: Self-pay

## 2018-11-24 ENCOUNTER — Encounter: Payer: Self-pay | Admitting: Physician Assistant

## 2018-11-24 ENCOUNTER — Encounter: Payer: Self-pay | Admitting: Cardiovascular Disease

## 2018-11-24 VITALS — Ht 63.0 in | Wt 145.0 lb

## 2018-11-24 DIAGNOSIS — I2510 Atherosclerotic heart disease of native coronary artery without angina pectoris: Secondary | ICD-10-CM | POA: Diagnosis not present

## 2018-11-24 DIAGNOSIS — E785 Hyperlipidemia, unspecified: Secondary | ICD-10-CM

## 2018-11-24 DIAGNOSIS — I480 Paroxysmal atrial fibrillation: Secondary | ICD-10-CM

## 2018-11-24 DIAGNOSIS — R195 Other fecal abnormalities: Secondary | ICD-10-CM | POA: Diagnosis not present

## 2018-11-24 DIAGNOSIS — I951 Orthostatic hypotension: Secondary | ICD-10-CM

## 2018-11-24 DIAGNOSIS — I779 Disorder of arteries and arterioles, unspecified: Secondary | ICD-10-CM

## 2018-11-24 DIAGNOSIS — G459 Transient cerebral ischemic attack, unspecified: Secondary | ICD-10-CM | POA: Diagnosis not present

## 2018-11-24 NOTE — Patient Instructions (Signed)
It was a pleasure to speak with you on the phone today! Thank you for allowing us to continue taking care of your Heartcare needs during this time.   Feel free to call as needed for questions and concerns related to your cardiac needs.   Medication Instructions:  Your physician recommends that you continue on your current medications as directed. Please refer to the Current Medication list given to you today.  If you need a refill on your cardiac medications before your next appointment, please call your pharmacy.   Lab work: None ordered  If you have labs (blood work) drawn today and your tests are completely normal, you will receive your results only by: . MyChart Message (if you have MyChart) OR . A paper copy in the mail If you have any lab test that is abnormal or we need to change your treatment, we will call you to review the results.  Testing/Procedures: None ordered   Follow-Up: At [facility], you and your health needs are our priority.  As part of our continuing mission to provide you with exceptional heart care, we have created designated Provider Care Teams.  These Care Teams include your primary Cardiologist (physician) and Advanced Practice Providers (APPs -  Physician Assistants and Nurse Practitioners) who all work together to provide you with the care you need, when you need it. You will need a follow up appointment in 6 months.  Please call our office 2 months in advance to schedule this appointment.  You may see Muhammad Arida, MD or Ryan Dunn, PA-C.    

## 2018-12-19 ENCOUNTER — Ambulatory Visit: Payer: Medicare HMO | Admitting: Urology

## 2018-12-20 NOTE — Progress Notes (Signed)
12/21/2018 3:19 PM   Gary AmassSamuel J Lynn 10/21/1930 [id number]  Referring provider: Sherrie MustacheJadali, Fayegh, MD [address] [place],  [place] [address]  Chief Complaint  Patient presents with  . Benign Prostatic Hypertrophy    HPI: Mr. Gary Lynn is an 85 year old male with a h/o urethral stricture and BPH with LUTS who presents today for a one year follow up.  History of urethral stricture Patient underwent a difficult foley placement with filiform's and followers in 2014 by Dr. Edwyna ShellHart for a PVR of 500 mL.  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  He had the foley in place for several days and was started on tamsulosin and finasteride.  His PVR is 54 mL.    BPH WITH LUTS His IPSS score today is 12, which is moderate lower urinary tract symptomatology.  He is unhappy with his quality life due to his urinary symptoms.  His PVR is 54 mL.  His previous IPSS score was 4/3.   His previous PVR is 105 mL.  He is complaining of frequency at this time.  He denies any dysuria, hematuria or suprapubic pain.  He currently finasteride 5 mg daily.  He also denies any recent fevers, chills, nausea or vomiting.  He does not have a family history of PCa.  Patient continues to  drinking large amounts of sweet tea in the evenings.    IPSS    Row Name 12/21/18 1100         International Prostate Symptom Score   How often have you had the sensation of not emptying your bladder?  Less than 1 in 5     How often have you had to urinate less than every two hours?  Almost always     How often have you found you stopped and started again several times when you urinated?  Less than 1 in 5 times     How often have you found it difficult to postpone urination?  Less than 1 in 5 times     How often have you had a weak urinary stream?  Less than half the time     How often have you had to strain to start urination?  Less than half the time     How many times did you typically get up at night to urinate?  None      Total IPSS Score  12       Quality of Life due to urinary symptoms   If you were to spend the rest of your life with your urinary condition just the way it is now how would you feel about that?  Unhappy        Score:  1-7 Mild 8-19 Moderate 20-35 Severe    PMH: Past Medical History:  Diagnosis Date  . Carotid artery stenosis    a. carotid doppler 05/2016: RICA 40-59%, LICA 1-39%  . Chronic diastolic CHF (congestive heart failure) (HCC)    a. echo 05/2016: EF 55-60%, AK of the basal-midinferolateral and inferior myocardium (c/w prior study), GR1DD, aortic sclerosis without stenosis, mild MR, PASP 40 mmHg  . Coronary artery disease    Cardiac cath in February of 2013: EF 60%, high-grade chronic disease in OM 1, occluded mid RCA at the site of a previously placed stent with left-to-right collaterals, occluded left SFA.  . Diabetes mellitus with complication (HCC)   . Hypercholesterolemia   . Hypertension   . PAF (paroxysmal atrial fibrillation) (HCC)  a. Zio monitor 05/2016: predominant rhyhtm of sinus with intermittent episodes of Afib/flutter with the longest episode being 6 hours and 51 minutes with an average heart rate of 114 bpm. Occasional PACs and PVCs. b. CHADS2VASc --> 6 (CHF, HTN, age x 2, DM, vascular disease)    Surgical History: Past Surgical History:  Procedure Laterality Date  . BACK SURGERY     years ago  . CARDIAC CATHETERIZATION  07/2007   [facility]  . CARDIAC CATHETERIZATION  07/2011   [facility]  . CORONARY ANGIOPLASTY WITH STENT PLACEMENT      Home Medications:  Allergies as of 12/21/2018   No Known Allergies     Medication List       Accurate as of December 21, 2018 11:59 PM. If you have any questions, ask your nurse or doctor.        amLODipine 5 MG tablet Commonly known as: NORVASC Take 5 mg by mouth daily.   atorvastatin 80 MG tablet Commonly known as: LIPITOR Take 1 tablet (80 mg total) by mouth daily at 6 PM. What changed: how much to take    ferrous sulfate 325 (65 FE) MG tablet Take 325 mg by mouth daily.   fesoterodine 4 MG Tb24 tablet Commonly known as: TOVIAZ Take 1 tablet (4 mg total) by mouth daily. Started by: Michiel CowboySHANNON Erionna Strum, PA-C   finasteride 5 MG tablet Commonly known as: PROSCAR Take 1 tablet by mouth once daily   fish oil-omega-3 fatty acids 1000 MG capsule Take 1 g by mouth 2 (two) times daily.   glipiZIDE 5 MG 24 hr tablet Commonly known as: GLUCOTROL XL Take 5 mg by mouth daily before supper.   lisinopril 10 MG tablet Commonly known as: ZESTRIL Take 20 mg by mouth 2 (two) times daily before a meal.   Magnesium 250 MG Tabs Take 250 mg by mouth daily.   OneTouch Verio test strip Generic drug: glucose blood   ranitidine 150 MG tablet Commonly known as: Zantac Take 1 tablet (150 mg total) by mouth daily.   Rivaroxaban 15 MG Tabs tablet Commonly known as: Xarelto Take 1 tablet (15 mg total) by mouth daily with supper.   tamsulosin 0.4 MG Caps capsule Commonly known as: FLOMAX Take 1 capsule (0.4 mg total) by mouth daily. What changed: when to take this   Tradjenta 5 MG Tabs tablet Generic drug: linagliptin Take 5 mg by mouth daily before supper.       Allergies: No Known Allergies  Family History: Family History  Problem Relation Age of Onset  . Dementia Mother   . Heart attack Father   . Kidney disease Neg Hx   . Prostate cancer Neg Hx     Social History:  reports that he has been smoking cigarettes. He has a 25.00 pack-year smoking history. He has never used smokeless tobacco. He reports that he does not drink alcohol or use drugs.  ROS: UROLOGY Frequent Urination?: No Hard to postpone urination?: No Burning/pain with urination?: No Get up at night to urinate?: No Leakage of urine?: No Urine stream starts and stops?: No Trouble starting stream?: No Do you have to strain to urinate?: No Blood in urine?: No Urinary tract infection?: No Sexually transmitted disease?: No  Injury to kidneys or bladder?: No Painful intercourse?: No Weak stream?: No Erection problems?: No Penile pain?: No  Gastrointestinal Nausea?: No Vomiting?: No Indigestion/heartburn?: No Diarrhea?: No Constipation?: No  Constitutional Fever: No Night sweats?: No Weight loss?: No Fatigue?: No  Skin  Skin rash/lesions?: No Itching?: No  Eyes Blurred vision?: No Double vision?: No  Ears/Nose/Throat Sore throat?: No Sinus problems?: No  Hematologic/Lymphatic Swollen glands?: No Easy bruising?: No  Cardiovascular Leg swelling?: No Chest pain?: No  Respiratory Cough?: Yes Shortness of breath?: No  Endocrine Excessive thirst?: No  Musculoskeletal Back pain?: No Joint pain?: No  Neurological Headaches?: No Dizziness?: No  Psychologic Depression?: No Anxiety?: No  Physical Exam: BP (!) 194/68 (BP Location: Left Arm, Patient Position: Sitting, Cuff Size: Normal)   Pulse 86   Ht 5\' 3"  (1.6 m)   Wt 145 lb (65.8 kg)   BMI 25.69 kg/m   Constitutional:  Well nourished. Alert and oriented, No acute distress. HEENT: [place] AT, moist mucus membranes.  Trachea midline, no masses. Cardiovascular: No clubbing, cyanosis, or edema. Respiratory: Normal respiratory effort, no increased work of breathing. GI: Abdomen is soft, non tender, non distended, no abdominal masses. Liver and spleen not palpable.  No hernias appreciated.  Stool sample for occult testing is not indicated.   GU: No CVA tenderness.  No bladder fullness or masses.  Patient with uncircumcised phallus.  Foreskin easily retracted.   Urethral meatus is patent.  No penile discharge. No penile lesions or rashes. Scrotum without lesions, cysts, rashes and/or edema.  Testicles are located scrotally bilaterally. No masses are appreciated in the testicles. Left and right epididymis are normal. Rectal: Patient with  normal sphincter tone. Anus and perineum without scarring or rashes. No rectal masses are appreciated.  Prostate is approximately 50 grams, no nodules are appreciated.  Skin: No rashes, bruises or suspicious lesions. Lymph: No inguinal adenopathy. Neurologic: Grossly intact, no focal deficits, moving all 4 extremities. Psychiatric: Normal mood and affect.  Laboratory Data: Lab Results  Component Value Date   WBC 8.1 03/14/2018   HGB 12.0 (L) 03/14/2018   HCT 34.9 (L) 03/14/2018   MCV 89 03/14/2018   PLT 183 03/14/2018   Previous PSA's:     3.0 ng/mL on 03/20/2014  I have reviewed the labs   Assessment & Plan:    1. BPH with LUTS IPSS score is 12/5,  it is worse  Continue conservative management, avoiding bladder irritants and timed voiding's Continue finasteride 5 mg daily RTC in 12 months for I PSS and exam   2. Frequency Will have a trial of Toviaz 4 mg daily, #28 samples given -  advised of the side effects such as: Dry eyes, dry mouth, constipation, mental confusion and/or urinary retention - did not consider Myrbetriq due to HTN RTC in 3 weeks I PSS and PVR   Return in about 3 weeks (around 01/11/2019) for I PSS and PVR .  Zara Council, PA-C  [facility] [address] [place], [place] [address] [phone]

## 2018-12-21 ENCOUNTER — Ambulatory Visit (INDEPENDENT_AMBULATORY_CARE_PROVIDER_SITE_OTHER): Payer: Medicare HMO | Admitting: Urology

## 2018-12-21 ENCOUNTER — Encounter: Payer: Self-pay | Admitting: Urology

## 2018-12-21 ENCOUNTER — Other Ambulatory Visit: Payer: Self-pay | Admitting: Urology

## 2018-12-21 ENCOUNTER — Other Ambulatory Visit: Payer: Self-pay

## 2018-12-21 VITALS — BP 194/68 | HR 86 | Ht 63.0 in | Wt 145.0 lb

## 2018-12-21 DIAGNOSIS — N138 Other obstructive and reflux uropathy: Secondary | ICD-10-CM | POA: Diagnosis not present

## 2018-12-21 DIAGNOSIS — N401 Enlarged prostate with lower urinary tract symptoms: Secondary | ICD-10-CM | POA: Diagnosis not present

## 2018-12-21 DIAGNOSIS — R350 Frequency of micturition: Secondary | ICD-10-CM | POA: Diagnosis not present

## 2018-12-21 LAB — BLADDER SCAN AMB NON-IMAGING: Scan Result: 54

## 2018-12-21 MED ORDER — FESOTERODINE FUMARATE ER 4 MG PO TB24: 4.0000 mg | ORAL_TABLET | Freq: Every day | ORAL | 0 refills | Status: DC

## 2018-12-28 ENCOUNTER — Other Ambulatory Visit: Payer: Self-pay | Admitting: Urology

## 2018-12-29 ENCOUNTER — Other Ambulatory Visit: Payer: Self-pay | Admitting: Urology

## 2019-01-15 NOTE — Progress Notes (Signed)
01/16/2019 11:46 AM   Gary Lynn 10/28/1930 [id number]  Referring provider: Casilda Carls, MD [address] [place],   [address]  Chief Complaint  Patient presents with  . Benign Prostatic Hypertrophy    HPI: Gary Lynn is an 85 year old male with a h/o urethral stricture and BPH with LUTS who presents today for a 3 week trial of Toviaz.  History of urethral stricture Patient underwent a difficult foley placement with filiform's and followers in 2014 by Dr. Elnoria Howard for a PVR of 500 mL.  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  He had the foley in place for several days and was started on tamsulosin and finasteride.  His PVR is 92  mL.    BPH WITH LUTS His IPSS score today is 15, which is moderate lower urinary tract symptomatology.  He is mostly dissatisfied  with his quality life due to his urinary symptoms.  His PVR is 92 mL.  His previous IPSS score was 12/4.   His previous PVR is 54 mL.  He feels that the Toviaz 4 mg daily has helped with his urinary frequency.  Patient denies any gross hematuria, dysuria or suprapubic/flank pain.  Patient denies any fevers, chills, nausea or vomiting.   IPSS    Row Name 01/16/19 1100         International Prostate Symptom Score   How often have you had the sensation of not emptying your bladder?  Less than half the time     How often have you had to urinate less than every two hours?  More than half the time     How often have you found you stopped and started again several times when you urinated?  Less than 1 in 5 times     How often have you found it difficult to postpone urination?  Less than half the time     How often have you had a weak urinary stream?  Less than half the time     How often have you had to strain to start urination?  Less than 1 in 5 times     How many times did you typically get up at night to urinate?  3 Times     Total IPSS Score  15       Quality of Life due to urinary symptoms   If  you were to spend the rest of your life with your urinary condition just the way it is now how would you feel about that?  Mostly Disatisfied        Score:  1-7 Mild 8-19 Moderate 20-35 Severe    PMH: Past Medical History:  Diagnosis Date  . Carotid artery stenosis    a. carotid doppler 24/5809: RICA 98-33%, LICA 8-25%  . Chronic diastolic CHF (congestive heart failure) ([place])    a. echo 05/2016: EF 55-60%, AK of the basal-midinferolateral and inferior myocardium (c/w prior study), GR1DD, aortic sclerosis without stenosis, mild MR, PASP 40 mmHg  . Coronary artery disease    Cardiac cath in February of 2013: EF 60%, high-grade chronic disease in OM 1, occluded mid RCA at the site of a previously placed stent with left-to-right collaterals, occluded left SFA.  . Diabetes mellitus with complication ([place])   . Hypercholesterolemia   . Hypertension   . PAF (paroxysmal atrial fibrillation) ([place])    a. Zio monitor 05/2016: predominant rhyhtm of sinus with intermittent episodes of Afib/flutter with the  longest episode being 6 hours and 51 minutes with an average heart rate of 114 bpm. Occasional PACs and PVCs. b. CHADS2VASc --> 6 (CHF, HTN, age x 2, DM, vascular disease)    Surgical History: Past Surgical History:  Procedure Laterality Date  . BACK SURGERY     years ago  . CARDIAC CATHETERIZATION  07/2007   [facility]  . CARDIAC CATHETERIZATION  07/2011   [facility]  . CORONARY ANGIOPLASTY WITH STENT PLACEMENT      Home Medications:  Allergies as of 01/16/2019   No Known Allergies     Medication List       Accurate as of January 16, 2019 11:46 AM. If you have any questions, ask your nurse or doctor.        amLODipine 5 MG tablet Commonly known as: NORVASC Take 5 mg by mouth daily.   atorvastatin 80 MG tablet Commonly known as: LIPITOR Take 1 tablet (80 mg total) by mouth daily at 6 PM. What changed: how much to take   ferrous sulfate 325 (65 FE) MG tablet Take 325 mg by mouth daily.    fesoterodine 4 MG Tb24 tablet Commonly known as: TOVIAZ Take 1 tablet (4 mg total) by mouth daily.   finasteride 5 MG tablet Commonly known as: PROSCAR Take 1 tablet by mouth once daily   fish oil-omega-3 fatty acids 1000 MG capsule Take 1 g by mouth 2 (two) times daily.   glipiZIDE 5 MG 24 hr tablet Commonly known as: GLUCOTROL XL Take 5 mg by mouth daily before supper.   lisinopril 10 MG tablet Commonly known as: ZESTRIL Take 20 mg by mouth 2 (two) times daily before a meal.   Magnesium 250 MG Tabs Take 250 mg by mouth daily.   OneTouch Verio test strip Generic drug: glucose blood   oxybutynin 5 MG tablet Commonly known as: DITROPAN Take 1 tablet (5 mg total) by mouth 3 (three) times daily. Started by: Michiel CowboySHANNON Katrianna Friesenhahn, PA-C   ranitidine 150 MG tablet Commonly known as: Zantac Take 1 tablet (150 mg total) by mouth daily.   Rivaroxaban 15 MG Tabs tablet Commonly known as: Xarelto Take 1 tablet (15 mg total) by mouth daily with supper.   tamsulosin 0.4 MG Caps capsule Commonly known as: FLOMAX Take 1 capsule by mouth once daily   Tradjenta 5 MG Tabs tablet Generic drug: linagliptin Take 5 mg by mouth daily before supper.       Allergies: No Known Allergies  Family History: Family History  Problem Relation Age of Onset  . Dementia Mother   . Heart attack Father   . Kidney disease Neg Hx   . Prostate cancer Neg Hx     Social History:  reports that he has been smoking cigarettes. He has a 25.00 pack-year smoking history. He has never used smokeless tobacco. He reports that he does not drink alcohol or use drugs.  ROS: UROLOGY Frequent Urination?: No Hard to postpone urination?: No Burning/pain with urination?: No Get up at night to urinate?: No Leakage of urine?: No Urine stream starts and stops?: No Trouble starting stream?: No Do you have to strain to urinate?: No Blood in urine?: No Urinary tract infection?: No Sexually transmitted  disease?: No Injury to kidneys or bladder?: No Painful intercourse?: No Weak stream?: No Erection problems?: No Penile pain?: No  Gastrointestinal Nausea?: No Vomiting?: No Indigestion/heartburn?: No Diarrhea?: No Constipation?: No  Constitutional Fever: No Night sweats?: No Weight loss?: No Fatigue?: No  Skin Skin  rash/lesions?: No Itching?: No  Eyes Blurred vision?: No Double vision?: No  Ears/Nose/Throat Sore throat?: No Sinus problems?: No  Hematologic/Lymphatic Swollen glands?: No Easy bruising?: No  Cardiovascular Leg swelling?: No Chest pain?: No  Respiratory Cough?: No Shortness of breath?: No  Endocrine Excessive thirst?: No  Musculoskeletal Back pain?: No Joint pain?: No  Neurological Headaches?: No Dizziness?: No  Psychologic Depression?: No Anxiety?: No  Physical Exam: BP (!) 162/77 (BP Location: Left Arm, Patient Position: Sitting, Cuff Size: Normal)   Pulse 90   Ht 5\' 3"  (1.6 m)   Wt 140 lb (63.5 kg)   BMI 24.80 kg/m   Constitutional:  Well nourished. Alert and oriented, No acute distress. HEENT: [place] AT, moist mucus membranes.  Trachea midline, no masses. Cardiovascular: No clubbing, cyanosis, or edema. Respiratory: Normal respiratory effort, no increased work of breathing. Neurologic: Grossly intact, no focal deficits, moving all 4 extremities. Psychiatric: Normal mood and affect.  Laboratory Data: Lab Results  Component Value Date   WBC 8.1 03/14/2018   HGB 12.0 (L) 03/14/2018   HCT 34.9 (L) 03/14/2018   MCV 89 03/14/2018   PLT 183 03/14/2018   Previous PSA's:     3.0 ng/mL on 03/20/2014  I have reviewed the labs   Assessment & Plan:    1. BPH with LUTS IPSS score is 15/4,  it is somewhat improved Continue conservative management, avoiding bladder irritants and timed voiding's Continue finasteride 5 mg daily RTC in 12 months for I PSS and exam   2. Frequency He found the Toviaz effective in curbing his  frequency, but his insurance requires a step medication of oxybutynin prior to coverage I have sent a prescription in for oxybutynin IR 5 mg TID   Return in about 3 months (around 04/18/2019) for IPSS and PVR.  Michiel CowboySHANNON Tazaria Dlugosz, PA-C  [facility] [address] [place], [place] [address] [phone]

## 2019-01-16 ENCOUNTER — Other Ambulatory Visit: Payer: Self-pay

## 2019-01-16 ENCOUNTER — Encounter: Payer: Self-pay | Admitting: Urology

## 2019-01-16 ENCOUNTER — Ambulatory Visit (INDEPENDENT_AMBULATORY_CARE_PROVIDER_SITE_OTHER): Payer: Medicare HMO | Admitting: Urology

## 2019-01-16 VITALS — BP 162/77 | HR 90 | Ht 63.0 in | Wt 140.0 lb

## 2019-01-16 DIAGNOSIS — R350 Frequency of micturition: Secondary | ICD-10-CM

## 2019-01-16 DIAGNOSIS — N138 Other obstructive and reflux uropathy: Secondary | ICD-10-CM | POA: Diagnosis not present

## 2019-01-16 DIAGNOSIS — N401 Enlarged prostate with lower urinary tract symptoms: Secondary | ICD-10-CM | POA: Diagnosis not present

## 2019-01-16 MED ORDER — OXYBUTYNIN CHLORIDE 5 MG PO TABS: 5.0000 mg | ORAL_TABLET | Freq: Three times a day (TID) | ORAL | 0 refills | Status: DC

## 2019-02-14 ENCOUNTER — Other Ambulatory Visit: Payer: Self-pay | Admitting: Urology

## 2019-02-14 DIAGNOSIS — R350 Frequency of micturition: Secondary | ICD-10-CM

## 2019-02-14 DIAGNOSIS — N138 Other obstructive and reflux uropathy: Secondary | ICD-10-CM

## 2019-03-26 ENCOUNTER — Telehealth: Payer: Self-pay | Admitting: Family Medicine

## 2019-03-26 NOTE — Telephone Encounter (Signed)
Called patient to make sure the Oxybutynin is working well. Wife said he will return call.

## 2019-03-27 ENCOUNTER — Telehealth: Payer: Self-pay | Admitting: Urology

## 2019-03-27 ENCOUNTER — Other Ambulatory Visit: Payer: Self-pay | Admitting: Family Medicine

## 2019-03-27 DIAGNOSIS — N401 Enlarged prostate with lower urinary tract symptoms: Secondary | ICD-10-CM

## 2019-03-27 DIAGNOSIS — N138 Other obstructive and reflux uropathy: Secondary | ICD-10-CM

## 2019-03-27 DIAGNOSIS — R350 Frequency of micturition: Secondary | ICD-10-CM

## 2019-03-27 MED ORDER — OXYBUTYNIN CHLORIDE 5 MG PO TABS: 5.0000 mg | ORAL_TABLET | Freq: Three times a day (TID) | ORAL | 3 refills | Status: DC

## 2019-03-27 MED ORDER — FINASTERIDE 5 MG PO TABS: 5.0000 mg | ORAL_TABLET | Freq: Every day | ORAL | 3 refills | Status: DC

## 2019-03-27 MED ORDER — TAMSULOSIN HCL 0.4 MG PO CAPS: 0.4000 mg | ORAL_CAPSULE | Freq: Every day | ORAL | 3 refills | Status: DC

## 2019-03-27 NOTE — Telephone Encounter (Signed)
Pt's daughter Genevie Cheshire would like for you to call her about pt.  Phone# [phone]

## 2019-03-27 NOTE — Telephone Encounter (Signed)
Medication have been refilled.  

## 2019-03-28 NOTE — Telephone Encounter (Signed)
Patient notified I am unable to speak to daughter. She is not on the DPR.

## 2019-04-18 NOTE — Progress Notes (Deleted)
04/19/2019 12:23 PM   Gary Lynn [birth year] [id number]  Referring provider: Sherrie Mustache, MD [address] [place],  [place] [address]  No chief complaint on file.   HPI: Mr. Gary Lynn is an 85 year old male with a h/o urethral stricture and BPH with LUTS who presents today for a 3 month follow up.    History of urethral stricture Patient underwent a difficult foley placement with filiform's and followers in 2014 by Dr. Edwyna Lynn for a PVR of 500 mL.  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  He had the foley in place for several days and was started on tamsulosin and finasteride.  His PVR is ***  mL.    BPH WITH LUTS His IPSS score today is ***, which is *** lower urinary tract symptomatology.  He is *** with his quality life due to his urinary symptoms.  His PVR is *** mL.  His previous IPSS score was 15/4.   His previous PVR is 92 mL.  He feels that the Toviaz 4 mg daily has helped with his urinary frequency.  Patient denies any gross hematuria, dysuria or suprapubic/flank pain.  Patient denies any fevers, chills, nausea or vomiting.     Score:  1-7 Mild 8-19 Moderate 20-35 Severe    PMH: Past Medical History:  Diagnosis Date  . Carotid artery stenosis    a. carotid doppler 05/2016: RICA 40-59%, LICA 1-39%  . Chronic diastolic CHF (congestive heart failure) (HCC)    a. echo 05/2016: EF 55-60%, AK of the basal-midinferolateral and inferior myocardium (c/w prior study), GR1DD, aortic sclerosis without stenosis, mild MR, PASP 40 mmHg  . Coronary artery disease    Cardiac cath in February of 2013: EF 60%, high-grade chronic disease in OM 1, occluded mid RCA at the site of a previously placed stent with left-to-right collaterals, occluded left SFA.  . Diabetes mellitus with complication (HCC)   . Hypercholesterolemia   . Hypertension   . PAF (paroxysmal atrial fibrillation) (HCC)    a. Zio monitor 05/2016: predominant rhyhtm of sinus with intermittent  episodes of Afib/flutter with the longest episode being 6 hours and 51 minutes with an average heart rate of 114 bpm. Occasional PACs and PVCs. b. CHADS2VASc --> 6 (CHF, HTN, age x 2, DM, vascular disease)    Surgical History: Past Surgical History:  Procedure Laterality Date  . BACK SURGERY     years ago  . CARDIAC CATHETERIZATION  07/2007   [facility]  . CARDIAC CATHETERIZATION  07/2011   [facility]  . CORONARY ANGIOPLASTY WITH STENT PLACEMENT      Home Medications:  Allergies as of 04/19/2019   No Known Allergies     Medication List       Accurate as of April 18, 2019 12:23 PM. If you have any questions, ask your nurse or doctor.        amLODipine 5 MG tablet Commonly known as: NORVASC Take 5 mg by mouth daily.   atorvastatin 80 MG tablet Commonly known as: LIPITOR Take 1 tablet (80 mg total) by mouth daily at 6 PM. What changed: how much to take   ferrous sulfate 325 (65 FE) MG tablet Take 325 mg by mouth daily.   fesoterodine 4 MG Tb24 tablet Commonly known as: TOVIAZ Take 1 tablet (4 mg total) by mouth daily.   finasteride 5 MG tablet Commonly known as: PROSCAR Take 1 tablet (5 mg total) by mouth daily.   fish oil-omega-3  fatty acids 1000 MG capsule Take 1 g by mouth 2 (two) times daily.   glipiZIDE 5 MG 24 hr tablet Commonly known as: GLUCOTROL XL Take 5 mg by mouth daily before supper.   lisinopril 10 MG tablet Commonly known as: ZESTRIL Take 20 mg by mouth 2 (two) times daily before a meal.   Magnesium 250 MG Tabs Take 250 mg by mouth daily.   OneTouch Verio test strip Generic drug: glucose blood   oxybutynin 5 MG tablet Commonly known as: DITROPAN Take 1 tablet (5 mg total) by mouth 3 (three) times daily.   ranitidine 150 MG tablet Commonly known as: Zantac Take 1 tablet (150 mg total) by mouth daily.   Rivaroxaban 15 MG Tabs tablet Commonly known as: Xarelto Take 1 tablet (15 mg total) by mouth daily with supper.   tamsulosin 0.4 MG Caps  capsule Commonly known as: FLOMAX Take 1 capsule (0.4 mg total) by mouth daily.   Tradjenta 5 MG Tabs tablet Generic drug: linagliptin Take 5 mg by mouth daily before supper.       Allergies: No Known Allergies  Family History: Family History  Problem Relation Age of Onset  . Dementia Mother   . Heart attack Father   . Kidney disease Neg Hx   . Prostate cancer Neg Hx     Social History:  reports that he has been smoking cigarettes. He has a 25.00 pack-year smoking history. He has never used smokeless tobacco. He reports that he does not drink alcohol or use drugs.  ROS:                                        Physical Exam: There were no vitals taken for this visit.  Constitutional:  Well nourished. Alert and oriented, No acute distress. HEENT: [place] AT, moist mucus membranes.  Trachea midline, no masses. Cardiovascular: No clubbing, cyanosis, or edema. Respiratory: Normal respiratory effort, no increased work of breathing. GI: Abdomen is soft, non tender, non distended, no abdominal masses. Liver and spleen not palpable.  No hernias appreciated.  Stool sample for occult testing is not indicated.   GU: No CVA tenderness.  No bladder fullness or masses.  Patient with circumcised/uncircumcised phallus. ***Foreskin easily retracted***  Urethral meatus is patent.  No penile discharge. No penile lesions or rashes. Scrotum without lesions, cysts, rashes and/or edema.  Testicles are located scrotally bilaterally. No masses are appreciated in the testicles. Left and right epididymis are normal. Rectal: Patient with  normal sphincter tone. Anus and perineum without scarring or rashes. No rectal masses are appreciated. Prostate is approximately *** grams, *** nodules are appreciated. Seminal vesicles are normal. Skin: No rashes, bruises or suspicious lesions. Lymph: No cervical or inguinal adenopathy. Neurologic: Grossly intact, no focal deficits, moving all 4  extremities. Psychiatric: Normal mood and affect.  Laboratory Data: Lab Results  Component Value Date   WBC 8.1 03/14/2018   HGB 12.0 (L) 03/14/2018   HCT 34.9 (L) 03/14/2018   MCV 89 03/14/2018   PLT 183 03/14/2018   Previous PSA's:     3.0 ng/mL on 03/20/2014  I have reviewed the labs   Assessment & Plan:    1. BPH with LUTS IPSS score is 15/4,  it is somewhat improved Continue conservative management, avoiding bladder irritants and timed voiding's Continue finasteride 5 mg daily RTC in 12 months for I PSS and exam  2. Frequency He found the Toviaz effective in curbing his frequency, but his insurance requires a step medication of oxybutynin prior to coverage I have sent a prescription in for oxybutynin IR 5 mg TID   No follow-ups on file.  Zara Council, PA-C  [facility] [address] [place], [place] [address] [phone]

## 2019-04-19 ENCOUNTER — Ambulatory Visit: Payer: Medicare HMO | Admitting: Urology

## 2019-04-23 NOTE — Progress Notes (Signed)
04/24/2019 11:33 AM   Gary Lynn 1931-03-25 [id number]  Referring provider: Sherrie MustacheJadali, Fayegh, MD [address] [place],  [place] [id number]  Chief Complaint  Patient presents with  . Follow-up    HPI: Gary Lynn is an 85 year old male with a h/o urethral stricture and BPH with LUTS who presents today for a 3 month follow up.    History of urethral stricture Patient underwent a difficult foley placement with filiform's and followers in 2014 by Dr. Edwyna ShellHart for a PVR of 500 mL.  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  He had the foley in place for several days and was started on tamsulosin and finasteride.  His PVR is 286  mL.    BPH WITH LUTS His IPSS score today is 12, which is moderate lower urinary tract symptomatology.  He is mixed with his quality life due to his urinary symptoms.  His PVR is 286 mL.  His previous IPSS score was 15/4.   His previous PVR is 92 mL.  He feels that the oxybutynin helped with his urinary frequency, but he is experiencing a very dry mouth.  Patient denies any gross hematuria, dysuria or suprapubic/flank pain.  Patient denies any fevers, chills, nausea or vomiting.   IPSS    Row Name 04/24/19 1100         International Prostate Symptom Score   How often have you had the sensation of not emptying your bladder?  Not at All     How often have you had to urinate less than every two hours?  About half the time     How often have you found you stopped and started again several times when you urinated?  Less than 1 in 5 times     How often have you found it difficult to postpone urination?  Less than half the time     How often have you had a weak urinary stream?  Less than 1 in 5 times     How often have you had to strain to start urination?  Not at All     How many times did you typically get up at night to urinate?  5 Times     Total IPSS Score  12       Quality of Life due to urinary symptoms   If you were to spend the rest of your  life with your urinary condition just the way it is now how would you feel about that?  Mixed        Score:  1-7 Mild 8-19 Moderate 20-35 Severe    PMH: Past Medical History:  Diagnosis Date  . Carotid artery stenosis    a. carotid doppler 05/2016: RICA 40-59%, LICA 1-39%  . Chronic diastolic CHF (congestive heart failure) (HCC)    a. echo 05/2016: EF 55-60%, AK of the basal-midinferolateral and inferior myocardium (c/w prior study), GR1DD, aortic sclerosis without stenosis, mild MR, PASP 40 mmHg  . Coronary artery disease    Cardiac cath in February of 2013: EF 60%, high-grade chronic disease in OM 1, occluded mid RCA at the site of a previously placed stent with left-to-right collaterals, occluded left SFA.  . Diabetes mellitus with complication (HCC)   . Hypercholesterolemia   . Hypertension   . PAF (paroxysmal atrial fibrillation) (HCC)    a. Zio monitor 05/2016: predominant rhyhtm of sinus with intermittent episodes of Afib/flutter with the longest episode being 6 hours  and 51 minutes with an average heart rate of 114 bpm. Occasional PACs and PVCs. b. CHADS2VASc --> 6 (CHF, HTN, age x 2, DM, vascular disease)    Surgical History: Past Surgical History:  Procedure Laterality Date  . BACK SURGERY     years ago  . CARDIAC CATHETERIZATION  07/2007   [facility]  . CARDIAC CATHETERIZATION  07/2011   [facility]  . CORONARY ANGIOPLASTY WITH STENT PLACEMENT      Home Medications:  Allergies as of 04/24/2019   No Known Allergies     Medication List       Accurate as of April 24, 2019 11:33 AM. If you have any questions, ask your nurse or doctor.        amLODipine 5 MG tablet Commonly known as: NORVASC Take 5 mg by mouth daily.   atorvastatin 80 MG tablet Commonly known as: LIPITOR Take 1 tablet (80 mg total) by mouth daily at 6 PM. What changed: how much to take   ferrous sulfate 325 (65 FE) MG tablet Take 325 mg by mouth daily.   fesoterodine 4 MG Tb24 tablet Commonly  known as: TOVIAZ Take 1 tablet (4 mg total) by mouth daily.   finasteride 5 MG tablet Commonly known as: PROSCAR Take 1 tablet (5 mg total) by mouth daily.   fish oil-omega-3 fatty acids 1000 MG capsule Take 1 g by mouth 2 (two) times daily.   glipiZIDE 5 MG 24 hr tablet Commonly known as: GLUCOTROL XL Take 5 mg by mouth daily before supper.   lisinopril 10 MG tablet Commonly known as: ZESTRIL Take 20 mg by mouth 2 (two) times daily before a meal.   Magnesium 250 MG Tabs Take 250 mg by mouth daily.   OneTouch Verio test strip Generic drug: glucose blood   oxybutynin 5 MG tablet Commonly known as: DITROPAN Take 1 tablet (5 mg total) by mouth 3 (three) times daily.   ranitidine 150 MG tablet Commonly known as: Zantac Take 1 tablet (150 mg total) by mouth daily.   Rivaroxaban 15 MG Tabs tablet Commonly known as: Xarelto Take 1 tablet (15 mg total) by mouth daily with supper.   tamsulosin 0.4 MG Caps capsule Commonly known as: FLOMAX Take 1 capsule (0.4 mg total) by mouth daily.   Tradjenta 5 MG Tabs tablet Generic drug: linagliptin Take 5 mg by mouth daily before supper.       Allergies: No Known Allergies  Family History: Family History  Problem Relation Age of Onset  . Dementia Mother   . Heart attack Father   . Kidney disease Neg Hx   . Prostate cancer Neg Hx     Social History:  reports that he has been smoking cigarettes. He has a 25.00 pack-year smoking history. He has never used smokeless tobacco. He reports that he does not drink alcohol or use drugs.  ROS: UROLOGY Frequent Urination?: No Hard to postpone urination?: No Burning/pain with urination?: No Get up at night to urinate?: No Leakage of urine?: No Urine stream starts and stops?: No Trouble starting stream?: No Do you have to strain to urinate?: No Blood in urine?: No Urinary tract infection?: No Sexually transmitted disease?: No Injury to kidneys or bladder?: No Painful  intercourse?: No Weak stream?: No Erection problems?: No Penile pain?: No  Gastrointestinal Nausea?: No Vomiting?: No Indigestion/heartburn?: No Diarrhea?: No Constipation?: No  Constitutional Fever: No Night sweats?: No Weight loss?: No Fatigue?: No  Skin Skin rash/lesions?: No Itching?: No  Eyes  Blurred vision?: No Double vision?: No  Ears/Nose/Throat Sore throat?: No Sinus problems?: No  Hematologic/Lymphatic Swollen glands?: No Easy bruising?: No  Cardiovascular Leg swelling?: No Chest pain?: No  Respiratory Cough?: No Shortness of breath?: No  Endocrine Excessive thirst?: No  Musculoskeletal Back pain?: No Joint pain?: No  Neurological Headaches?: No Dizziness?: No  Psychologic Depression?: No Anxiety?: No  Physical Exam: BP (!) 180/66   Pulse 80   Ht 5\' 3"  (1.6 m)   Wt 140 lb (63.5 kg)   BMI 24.80 kg/m   Constitutional:  Well nourished. Alert and oriented, No acute distress. HEENT: [place] AT, moist mucus membranes.  Trachea midline, no masses. Cardiovascular: No clubbing, cyanosis, or edema. Respiratory: Normal respiratory effort, no increased work of breathing. Neurologic: Grossly intact, no focal deficits, moving all 4 extremities. Psychiatric: Normal mood and affect.  Laboratory Data: Lab Results  Component Value Date   WBC 8.1 03/14/2018   HGB 12.0 (L) 03/14/2018   HCT 34.9 (L) 03/14/2018   MCV 89 03/14/2018   PLT 183 03/14/2018   Previous PSA's:     3.0 ng/mL on 03/20/2014  I have reviewed the labs  Pertinent Imaging Results for Gary Lynn, Gary Lynn (MRN Gary Lynn) as of 04/24/2019 11:19  Ref. Range 04/24/2019 11:16  Scan Result Unknown 13/08/2018     Assessment & Plan:    1. BPH with LUTS IPSS score is 12/3,  it is somewhat improved Continue conservative management, avoiding bladder irritants and timed voiding's Continue finasteride 5 mg daily RTC in 12 months for I PSS and exam   2. Frequency He found that the  oxybutynin IR 5 mg TID caused significant dry mouth.  He will discontinue that medication and we will restart the Toviaz 4 mg daily  Return in about 3 months (around 07/25/2019) for IPSS and PVR.  09/22/2019, PA-C  [facility] [address] [place], [place] [phone]

## 2019-04-24 ENCOUNTER — Encounter: Payer: Self-pay | Admitting: Urology

## 2019-04-24 ENCOUNTER — Other Ambulatory Visit: Payer: Self-pay

## 2019-04-24 ENCOUNTER — Ambulatory Visit (INDEPENDENT_AMBULATORY_CARE_PROVIDER_SITE_OTHER): Payer: Medicare HMO | Admitting: Urology

## 2019-04-24 VITALS — BP 180/66 | HR 80 | Ht 63.0 in | Wt 140.0 lb

## 2019-04-24 DIAGNOSIS — N401 Enlarged prostate with lower urinary tract symptoms: Secondary | ICD-10-CM | POA: Diagnosis not present

## 2019-04-24 DIAGNOSIS — N138 Other obstructive and reflux uropathy: Secondary | ICD-10-CM | POA: Diagnosis not present

## 2019-04-24 DIAGNOSIS — R350 Frequency of micturition: Secondary | ICD-10-CM

## 2019-04-24 LAB — BLADDER SCAN AMB NON-IMAGING

## 2019-04-24 MED ORDER — FESOTERODINE FUMARATE ER 4 MG PO TB24: 4.0000 mg | ORAL_TABLET | Freq: Every day | ORAL | 3 refills | Status: DC

## 2019-06-25 ENCOUNTER — Other Ambulatory Visit: Payer: Self-pay | Admitting: *Deleted

## 2019-06-25 DIAGNOSIS — N138 Other obstructive and reflux uropathy: Secondary | ICD-10-CM

## 2019-06-25 DIAGNOSIS — R350 Frequency of micturition: Secondary | ICD-10-CM

## 2019-06-25 MED ORDER — OXYBUTYNIN CHLORIDE 5 MG PO TABS: 5.0000 mg | ORAL_TABLET | Freq: Three times a day (TID) | ORAL | 3 refills | Status: DC

## 2019-07-10 ENCOUNTER — Ambulatory Visit: Payer: Medicare HMO | Admitting: Cardiovascular Disease

## 2019-07-24 ENCOUNTER — Ambulatory Visit: Payer: Medicare HMO | Admitting: Urology

## 2019-07-26 ENCOUNTER — Ambulatory Visit: Payer: Medicare HMO | Admitting: Urology

## 2019-07-26 ENCOUNTER — Encounter: Payer: Self-pay | Admitting: Urology

## 2019-07-26 ENCOUNTER — Other Ambulatory Visit: Payer: Self-pay

## 2019-07-26 VITALS — BP 178/78 | HR 85 | Ht 63.0 in | Wt 140.0 lb

## 2019-07-26 DIAGNOSIS — N138 Other obstructive and reflux uropathy: Secondary | ICD-10-CM

## 2019-07-26 DIAGNOSIS — R350 Frequency of micturition: Secondary | ICD-10-CM | POA: Diagnosis not present

## 2019-07-26 DIAGNOSIS — N401 Enlarged prostate with lower urinary tract symptoms: Secondary | ICD-10-CM | POA: Diagnosis not present

## 2019-07-26 LAB — BLADDER SCAN AMB NON-IMAGING: Scan Result: 339

## 2019-07-26 NOTE — Progress Notes (Signed)
07/26/2019 1:35 PM   Gary Lynn Nov 26, 1930 [id number]  Referring provider: Casilda Carls, MD [place],  [place] [address]  Chief Complaint  Patient presents with  . Benign Prostatic Hypertrophy    HPI: Gary Lynn is an 86 year old male with a h/o urethral stricture and BPH with LUTS who presents today for a 3 month follow up.    History of urethral stricture Patient underwent a difficult foley placement with filiform's and followers in 2014 by Dr. Elnoria Howard for a PVR of 500 mL.  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  He had the foley in place for several days and was started on tamsulosin and finasteride.  His PVR today is 339  mL.    BPH WITH LUTS His IPSS score today is 12, which is moderate lower urinary tract symptomatology.  He is mostly satisfied with his quality life due to his urinary symptoms.  His PVR is 286 mL.  His previous IPSS score was 12/3.   His previous PVR is 286 mL.  Patient denies any modifying or aggravating factors.  Patient denies any gross hematuria, dysuria or suprapubic/flank pain.  Patient denies any fevers, chills, nausea or vomiting.   IPSS    Row Name 07/26/19 1300         International Prostate Symptom Score   How often have you had the sensation of not emptying your bladder?  Less than half the time     How often have you had to urinate less than every two hours?  Less than half the time     How often have you found you stopped and started again several times when you urinated?  Less than 1 in 5 times     How often have you found it difficult to postpone urination?  About half the time     How often have you had a weak urinary stream?  Less than 1 in 5 times     How often have you had to strain to start urination?  Not at All     How many times did you typically get up at night to urinate?  3 Times     Total IPSS Score  12       Quality of Life due to urinary symptoms   If you were to spend the rest of your  life with your urinary condition just the way it is now how would you feel about that?  Mostly Satisfied        Score:  1-7 Mild 8-19 Moderate 20-35 Severe    PMH: Past Medical History:  Diagnosis Date  . Carotid artery stenosis    a. carotid doppler 29/5188: RICA 41-66%, LICA 0-63%  . Chronic diastolic CHF (congestive heart failure) ([place])    a. echo 05/2016: EF 55-60%, AK of the basal-midinferolateral and inferior myocardium (c/w prior study), GR1DD, aortic sclerosis without stenosis, mild MR, PASP 40 mmHg  . Coronary artery disease    Cardiac cath in February of 2013: EF 60%, high-grade chronic disease in OM 1, occluded mid RCA at the site of a previously placed stent with left-to-right collaterals, occluded left SFA.  . Diabetes mellitus with complication ([place])   . Hypercholesterolemia   . Hypertension   . PAF (paroxysmal atrial fibrillation) ([place])    a. Zio monitor 05/2016: predominant rhyhtm of sinus with intermittent episodes of Afib/flutter with the longest episode being 6 hours and 51 minutes with  an average heart rate of 114 bpm. Occasional PACs and PVCs. b. CHADS2VASc --> 6 (CHF, HTN, age x 2, DM, vascular disease)    Surgical History: Past Surgical History:  Procedure Laterality Date  . BACK SURGERY     years ago  . CARDIAC CATHETERIZATION  07/2007   [facility]  . CARDIAC CATHETERIZATION  07/2011   [facility]  . CORONARY ANGIOPLASTY WITH STENT PLACEMENT      Home Medications:  Allergies as of 07/26/2019   No Known Allergies     Medication List       Accurate as of July 26, 2019  1:35 PM. If you have any questions, ask your nurse or doctor.        amLODipine 5 MG tablet Commonly known as: NORVASC Take 5 mg by mouth daily.   atorvastatin 80 MG tablet Commonly known as: LIPITOR Take 1 tablet (80 mg total) by mouth daily at 6 PM. What changed: how much to take   ferrous sulfate 325 (65 FE) MG tablet Take 325 mg by mouth daily.   fesoterodine 4 MG Tb24  tablet Commonly known as: TOVIAZ Take 1 tablet (4 mg total) by mouth daily.   finasteride 5 MG tablet Commonly known as: PROSCAR Take 1 tablet (5 mg total) by mouth daily.   fish oil-omega-3 fatty acids 1000 MG capsule Take 1 g by mouth 2 (two) times daily.   glipiZIDE 5 MG 24 hr tablet Commonly known as: GLUCOTROL XL Take 5 mg by mouth daily before supper.   lisinopril 10 MG tablet Commonly known as: ZESTRIL Take 20 mg by mouth 2 (two) times daily before a meal.   Magnesium 250 MG Tabs Take 250 mg by mouth daily.   OneTouch Verio test strip Generic drug: glucose blood   oxybutynin 5 MG tablet Commonly known as: DITROPAN Take 1 tablet (5 mg total) by mouth 3 (three) times daily.   ranitidine 150 MG tablet Commonly known as: Zantac Take 1 tablet (150 mg total) by mouth daily.   Rivaroxaban 15 MG Tabs tablet Commonly known as: Xarelto Take 1 tablet (15 mg total) by mouth daily with supper.   tamsulosin 0.4 MG Caps capsule Commonly known as: FLOMAX Take 1 capsule (0.4 mg total) by mouth daily.   Tradjenta 5 MG Tabs tablet Generic drug: linagliptin Take 5 mg by mouth daily before supper.       Allergies: No Known Allergies  Family History: Family History  Problem Relation Age of Onset  . Dementia Mother   . Heart attack Father   . Kidney disease Neg Hx   . Prostate cancer Neg Hx     Social History:  reports that he has been smoking cigarettes. He has a 25.00 pack-year smoking history. He has never used smokeless tobacco. He reports that he does not drink alcohol or use drugs.  ROS: UROLOGY Frequent Urination?: No Hard to postpone urination?: No Burning/pain with urination?: No Get up at night to urinate?: No Leakage of urine?: No Urine stream starts and stops?: No Trouble starting stream?: No Do you have to strain to urinate?: No Blood in urine?: No Urinary tract infection?: No Sexually transmitted disease?: No Injury to kidneys or bladder?:  No Painful intercourse?: No Weak stream?: No Erection problems?: No Penile pain?: No  Gastrointestinal Nausea?: No Vomiting?: No Indigestion/heartburn?: No Diarrhea?: No Constipation?: No  Constitutional Fever: No Night sweats?: No Weight loss?: No Fatigue?: No  Skin Skin rash/lesions?: No Itching?: No  Eyes Blurred vision?: No  Double vision?: No  Ears/Nose/Throat Sore throat?: No Sinus problems?: No  Hematologic/Lymphatic Swollen glands?: No Easy bruising?: No  Cardiovascular Leg swelling?: No Chest pain?: No  Respiratory Cough?: No Shortness of breath?: No  Endocrine Excessive thirst?: No  Musculoskeletal Back pain?: No Joint pain?: No  Neurological Headaches?: No Dizziness?: No  Psychologic Depression?: No Anxiety?: No  Physical Exam: BP (!) 178/78   Pulse 85   Ht 5\' 3"  (1.6 m)   Wt 140 lb (63.5 kg)   BMI 24.80 kg/m   Constitutional:  Well nourished. Alert and oriented, No acute distress. HEENT: [place] AT, mask in place.  Trachea midline, no masses. Cardiovascular: No clubbing, cyanosis, or edema. Respiratory: Normal respiratory effort, no increased work of breathing. Neurologic: Grossly intact, no focal deficits, moving all 4 extremities. Psychiatric: Normal mood and affect.  Laboratory Data: Lab Results  Component Value Date   WBC 8.1 03/14/2018   HGB 12.0 (L) 03/14/2018   HCT 34.9 (L) 03/14/2018   MCV 89 03/14/2018   PLT 183 03/14/2018   Previous PSA's:     3.0 ng/mL on 03/20/2014  I have reviewed the labs  Pertinent Imaging Results for LAYMON, STOCKERT (MRN Lequita Halt) as of 07/26/2019 13:32  Ref. Range 07/26/2019 13:14  Scan Result Unknown 339    Assessment & Plan:    1. BPH with LUTS IPSS score is 12/2,  it is somewhat improved Continue conservative management, avoiding bladder irritants and timed voiding's Continue finasteride 5 mg daily and tamsulosin 0.4 mg daily  RTC in  12 months for I PSS and exam   2.  Frequency Controlled with Toviaz, but PVR's are increasing putting him at risk for urinary retention Discontinue the Toviaz and reassess in one month RTC in one month for I PSS and PVR    Return in about 1 month (around 08/23/2019) for IPSS and PVR.  10/23/2019, PA-C  [facility] [address] [place], [place] [phone]

## 2019-08-21 ENCOUNTER — Other Ambulatory Visit: Payer: Self-pay | Admitting: Urology

## 2019-08-23 ENCOUNTER — Other Ambulatory Visit: Payer: Self-pay

## 2019-08-23 ENCOUNTER — Encounter: Payer: Self-pay | Admitting: Urology

## 2019-08-23 ENCOUNTER — Ambulatory Visit (INDEPENDENT_AMBULATORY_CARE_PROVIDER_SITE_OTHER): Payer: Medicare HMO | Admitting: Urology

## 2019-08-23 VITALS — BP 181/61 | HR 84 | Ht 63.0 in | Wt 145.0 lb

## 2019-08-23 DIAGNOSIS — N401 Enlarged prostate with lower urinary tract symptoms: Secondary | ICD-10-CM

## 2019-08-23 DIAGNOSIS — N138 Other obstructive and reflux uropathy: Secondary | ICD-10-CM | POA: Diagnosis not present

## 2019-08-23 DIAGNOSIS — R350 Frequency of micturition: Secondary | ICD-10-CM | POA: Diagnosis not present

## 2019-08-23 LAB — BLADDER SCAN AMB NON-IMAGING: Scan Result: 107

## 2019-08-23 NOTE — Progress Notes (Signed)
08/23/2019 1:19 PM   Gary Lynn 1931/05/13 [id number]  Referring provider: Sherrie Mustache, MD [address] [place],  [place] [address]  No chief complaint on file.   HPI: Mr. Manasco is an 86 year old male with a h/o urethral stricture and BPH with LUTS who presents today for a 1 month follow up after discontinuing Toviaz.  History of urethral stricture Patient underwent a difficult foley placement with filiform's and followers in 2014 by Dr. Edwyna Shell for a PVR of 500 mL.  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  He had the foley in place for several days and was started on tamsulosin and finasteride.  His PVR today is 107 mL.    BPH WITH LUTS His IPSS score today is 16, which is moderate lower urinary tract symptomatology.  He is mixed with his quality life due to his urinary symptoms.  His PVR is 107 mL.  His previous IPSS score was 12/3.   His previous PVR is 339 mL.  He continues to take tamsulosin and finasteride.  Patient denies any modifying or aggravating factors.  Patient denies any gross hematuria, dysuria or suprapubic/flank pain.  Patient denies any fevers, chills, nausea or vomiting.   IPSS    Row Name 08/23/19 1300         International Prostate Symptom Score   How often have you had the sensation of not emptying your bladder?  Less than 1 in 5     How often have you had to urinate less than every two hours?  About half the time     How often have you found you stopped and started again several times when you urinated?  Less than 1 in 5 times     How often have you found it difficult to postpone urination?  More than half the time     How often have you had a weak urinary stream?  About half the time     How often have you had to strain to start urination?  Less than 1 in 5 times     How many times did you typically get up at night to urinate?  3 Times     Total IPSS Score  16       Quality of Life due to urinary symptoms   If you were to spend  the rest of your life with your urinary condition just the way it is now how would you feel about that?  Mixed        Score:  1-7 Mild 8-19 Moderate 20-35 Severe    PMH: Past Medical History:  Diagnosis Date  . Carotid artery stenosis    a. carotid doppler 05/2016: RICA 40-59%, LICA 1-39%  . Chronic diastolic CHF (congestive heart failure) (HCC)    a. echo 05/2016: EF 55-60%, AK of the basal-midinferolateral and inferior myocardium (c/w prior study), GR1DD, aortic sclerosis without stenosis, mild MR, PASP 40 mmHg  . Coronary artery disease    Cardiac cath in February of 2013: EF 60%, high-grade chronic disease in OM 1, occluded mid RCA at the site of a previously placed stent with left-to-right collaterals, occluded left SFA.  . Diabetes mellitus with complication (HCC)   . Hypercholesterolemia   . Hypertension   . PAF (paroxysmal atrial fibrillation) (HCC)    a. Zio monitor 05/2016: predominant rhyhtm of sinus with intermittent episodes of Afib/flutter with the longest episode being 6 hours and 51 minutes with  an average heart rate of 114 bpm. Occasional PACs and PVCs. b. CHADS2VASc --> 6 (CHF, HTN, age x 2, DM, vascular disease)    Surgical History: Past Surgical History:  Procedure Laterality Date  . BACK SURGERY     years ago  . CARDIAC CATHETERIZATION  07/2007   [facility]  . CARDIAC CATHETERIZATION  07/2011   [facility]  . CORONARY ANGIOPLASTY WITH STENT PLACEMENT      Home Medications:  Allergies as of 08/23/2019   No Known Allergies     Medication List       Accurate as of August 23, 2019  1:19 PM. If you have any questions, ask your nurse or doctor.        amLODipine 5 MG tablet Commonly known as: NORVASC Take 5 mg by mouth daily.   atorvastatin 80 MG tablet Commonly known as: LIPITOR Take 1 tablet (80 mg total) by mouth daily at 6 PM.   atorvastatin 40 MG tablet Commonly known as: LIPITOR   ferrous sulfate 325 (65 FE) MG tablet Take 325 mg by mouth daily.     fesoterodine 4 MG Tb24 tablet Commonly known as: TOVIAZ Take 1 tablet (4 mg total) by mouth daily.   finasteride 5 MG tablet Commonly known as: PROSCAR Take 1 tablet (5 mg total) by mouth daily.   fish oil-omega-3 fatty acids 1000 MG capsule Take 1 g by mouth 2 (two) times daily.   glipiZIDE 5 MG 24 hr tablet Commonly known as: GLUCOTROL XL Take 5 mg by mouth daily before supper.   lisinopril 10 MG tablet Commonly known as: ZESTRIL Take 20 mg by mouth 2 (two) times daily before a meal.   Magnesium 250 MG Tabs Take 250 mg by mouth daily.   OneTouch Verio test strip Generic drug: glucose blood   oxybutynin 5 MG tablet Commonly known as: DITROPAN Take 1 tablet (5 mg total) by mouth 3 (three) times daily.   ranitidine 150 MG tablet Commonly known as: Zantac Take 1 tablet (150 mg total) by mouth daily.   Rivaroxaban 15 MG Tabs tablet Commonly known as: Xarelto Take 1 tablet (15 mg total) by mouth daily with supper.   tamsulosin 0.4 MG Caps capsule Commonly known as: FLOMAX Take 1 capsule (0.4 mg total) by mouth daily.   Tradjenta 5 MG Tabs tablet Generic drug: linagliptin Take 5 mg by mouth daily before supper.       Allergies: No Known Allergies  Family History: Family History  Problem Relation Age of Onset  . Dementia Mother   . Heart attack Father   . Kidney disease Neg Hx   . Prostate cancer Neg Hx     Social History:  reports that he has been smoking cigarettes. He has a 25.00 pack-year smoking history. He has never used smokeless tobacco. He reports that he does not drink alcohol or use drugs.  ROS: For pertinent review of systems please refer to history of present illness  Physical Exam: BP (!) 181/61 (BP Location: Left Arm, Patient Position: Sitting, Cuff Size: Large)   Pulse 84   Ht 5\' 3"  (1.6 m)   Wt 145 lb (65.8 kg)   BMI 25.69 kg/m Constitutional:  Well nourished. Alert and oriented, No acute distress. HEENT: [place] AT, mask in place.   Trachea midline, no masses. Cardiovascular: No clubbing, cyanosis, or edema. Respiratory: Normal respiratory effort, no increased work of breathing. Neurologic: Grossly intact, no focal deficits, moving all 4 extremities. Psychiatric: Normal mood and affect.  Laboratory Data: Lab Results  Component Value Date   WBC 8.1 03/14/2018   HGB 12.0 (L) 03/14/2018   HCT 34.9 (L) 03/14/2018   MCV 89 03/14/2018   PLT 183 03/14/2018   Previous PSA's:     3.0 ng/mL on 03/20/2014  I have reviewed the labs  Pertinent Imaging Results for ASCENCION, COYE (MRN [id number]) as of 08/23/2019 13:25  Ref. Range 08/23/2019 13:06  Scan Result Unknown 107    Assessment & Plan:    1. BPH with LUTS IPSS score is 16/3,  it is worsened Continue conservative management, avoiding bladder irritants and timed voiding's Most bothersome symptoms are weak urinary stream and urgency Continue finasteride 5 mg daily and tamsulosin 0.4 mg daily  Explained to Mr. Goeken that he is on maximal medical therapy and if his symptoms are bothersome to him it would be appropriate to surgery cystoscopy as he has a history of urethral strictures and BPH.  I explained how a cystoscopy was performed and he deferred.  He would like to see if it worsens in the future RTC in 6 months for I PSS, PVR and exam -asked him to contact us sooner if he should experience worsening of symptoms, straining to urinate or urinary retention  2. Frequency Controlled with Toviaz, but PVR's are increasing putting him at risk for urinary retention Discontinue the Toviaz - PVR has decreased  RTC in 6 months for I PSS and PVR    Return in about 6 months (around 02/23/2020) for IPSS, PVR and exam.  Zara Council, [facility]  [place], [place] [id number] [phone]

## 2019-11-26 ENCOUNTER — Other Ambulatory Visit: Payer: Self-pay | Admitting: Urology

## 2020-02-27 NOTE — Progress Notes (Addendum)
02/28/2020 1:47 PM   Gary Lynn 05/03/1931 [id number]  Referring provider: Sherrie Mustache, MD [address] [place],  [place] [address]  Chief Complaint  Patient presents with  . Benign Prostatic Hypertrophy    HPI: Mr. Gary Lynn is an 86 year old male with a h/o urethral stricture and BPH with LUTS who presents today for a 12 month follow up.  History of urethral stricture Patient underwent a difficult foley placement with filiform's and followers in 2014 by Dr. Edwyna Shell for a PVR of 500 mL.  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  He had the foley in place for several days and was started on tamsulosin and finasteride.  His PVR today is 127 mL.    BPH WITH LUTS His IPSS score today is 22, which is severe lower urinary tract symptomatology.  He is unhappy with his quality life due to his urinary symptoms.  His PVR is 127 mL.  His previous IPSS score was 16/3.   His previous PVR is 107 mL.  He continues to take finasteride, but he has not been taking the tamsulosin.  Patient denies any modifying or aggravating factors.  Patient denies any gross hematuria, dysuria or suprapubic/flank pain.  Patient denies any fevers, chills, nausea or vomiting.    IPSS    Row Name 02/28/20 1300         International Prostate Symptom Score   How often have you had the sensation of not emptying your bladder? About half the time     How often have you had to urinate less than every two hours? Almost always     How often have you found you stopped and started again several times when you urinated? About half the time     How often have you found it difficult to postpone urination? About half the time     How often have you had a weak urinary stream? Less than 1 in 5 times     How often have you had to strain to start urination? Less than half the time     How many times did you typically get up at night to urinate? 5 Times     Total IPSS Score 22       Quality of Life due to  urinary symptoms   If you were to spend the rest of your life with your urinary condition just the way it is now how would you feel about that? Unhappy            Score:  1-7 Mild 8-19 Moderate 20-35 Severe    PMH: Past Medical History:  Diagnosis Date  . Carotid artery stenosis    a. carotid doppler 05/2016: RICA 40-59%, LICA 1-39%  . Chronic diastolic CHF (congestive heart failure) (HCC)    a. echo 05/2016: EF 55-60%, AK of the basal-midinferolateral and inferior myocardium (c/w prior study), GR1DD, aortic sclerosis without stenosis, mild MR, PASP 40 mmHg  . Coronary artery disease    Cardiac cath in February of 2013: EF 60%, high-grade chronic disease in OM 1, occluded mid RCA at the site of a previously placed stent with left-to-right collaterals, occluded left SFA.  . Diabetes mellitus with complication (HCC)   . Hypercholesterolemia   . Hypertension   . PAF (paroxysmal atrial fibrillation) (HCC)    a. Zio monitor 05/2016: predominant rhyhtm of sinus with intermittent episodes of Afib/flutter with the longest episode being 6 hours and 51 minutes  with an average heart rate of 114 bpm. Occasional PACs and PVCs. b. CHADS2VASc --> 6 (CHF, HTN, age x 2, DM, vascular disease)    Surgical History: Past Surgical History:  Procedure Laterality Date  . BACK SURGERY     years ago  . CARDIAC CATHETERIZATION  07/2007   [facility]  . CARDIAC CATHETERIZATION  07/2011   [facility]  . CORONARY ANGIOPLASTY WITH STENT PLACEMENT      Home Medications:  Allergies as of 02/28/2020   No Known Allergies     Medication List       Accurate as of February 28, 2020  1:47 PM. If you have any questions, ask your nurse or doctor.        amLODipine 5 MG tablet Commonly known as: NORVASC Take 5 mg by mouth daily.   atorvastatin 80 MG tablet Commonly known as: LIPITOR Take 1 tablet (80 mg total) by mouth daily at 6 PM.   atorvastatin 40 MG tablet Commonly known as: LIPITOR   ferrous sulfate 325  (65 FE) MG tablet Take 325 mg by mouth daily.   fesoterodine 4 MG Tb24 tablet Commonly known as: TOVIAZ Take 1 tablet (4 mg total) by mouth daily.   finasteride 5 MG tablet Commonly known as: PROSCAR TAKE 1 TABLET(5 MG) BY MOUTH DAILY   fish oil-omega-3 fatty acids 1000 MG capsule Take 1 g by mouth 2 (two) times daily.   glipiZIDE 5 MG 24 hr tablet Commonly known as: GLUCOTROL XL Take 5 mg by mouth daily before supper.   lisinopril 10 MG tablet Commonly known as: ZESTRIL Take 20 mg by mouth 2 (two) times daily before a meal.   Magnesium 250 MG Tabs Take 250 mg by mouth daily.   OneTouch Verio test strip Generic drug: glucose blood   oxybutynin 5 MG tablet Commonly known as: DITROPAN Take 1 tablet (5 mg total) by mouth 3 (three) times daily.   ranitidine 150 MG tablet Commonly known as: Zantac Take 1 tablet (150 mg total) by mouth daily.   Rivaroxaban 15 MG Tabs tablet Commonly known as: Xarelto Take 1 tablet (15 mg total) by mouth daily with supper.   tamsulosin 0.4 MG Caps capsule Commonly known as: FLOMAX Take 1 capsule (0.4 mg total) by mouth daily.   Tradjenta 5 MG Tabs tablet Generic drug: linagliptin Take 5 mg by mouth daily before supper.       Allergies: No Known Allergies  Family History: Family History  Problem Relation Age of Onset  . Dementia Mother   . Heart attack Father   . Kidney disease Neg Hx   . Prostate cancer Neg Hx     Social History:  reports that he has been smoking cigarettes. He has a 25.00 pack-year smoking history. He has never used smokeless tobacco. He reports that he does not drink alcohol and does not use drugs.  ROS: For pertinent review of systems please refer to history of present illness  Physical Exam: BP (!) 184/82   Pulse 76   Ht 5\' 3"  (1.6 m)   Wt 140 lb (63.5 kg)   BMI 24.80 kg/m   Constitutional:  Well nourished. Alert and oriented, No acute distress. HEENT: [place] AT, mask in place.  Trachea  midline Cardiovascular: No clubbing, cyanosis, or edema. Respiratory: Normal respiratory effort, no increased work of breathing. Neurologic: Grossly intact, no focal deficits, moving all 4 extremities. Psychiatric: Normal mood and affect.   Laboratory Data: Lab Results  Component Value Date  WBC 8.1 03/14/2018   HGB 12.0 (L) 03/14/2018   HCT 34.9 (L) 03/14/2018   MCV 89 03/14/2018   PLT 183 03/14/2018   Previous PSA's:     3.0 ng/mL on 03/20/2014  Urinalysis Component     Latest Ref Rng & Units 02/28/2020  Specific Gravity, UA     1.005 - 1.030 1.020  pH, UA     5.0 - 7.5 7.0  Color, UA     Yellow Yellow  Appearance Ur     Clear Clear  Leukocytes,UA     Negative Trace (A)  Protein,UA     Negative/Trace 2+ (A)  Glucose, UA     Negative Negative  Ketones, UA     Negative Negative  RBC, UA     Negative Trace (A)  Bilirubin, UA     Negative Negative  Urobilinogen, Ur     0.2 - 1.0 mg/dL 0.2  Nitrite, UA     Negative Negative  Microscopic Examination      See below:   Component     Latest Ref Rng & Units 02/28/2020  WBC, UA     0 - 5 /hpf 0-5  RBC     0 - 2 /hpf 0-2  Epithelial Cells (non renal)     0 - 10 /hpf 0-10  Bacteria, UA     None seen/Few None seen    I have reviewed the labs  Pertinent Imaging Results for MONTGOMERY, FAVOR (MRN [id number]) as of 02/28/2020 13:29  Ref. Range 02/28/2020 13:18  Scan Result Unknown 127 ml     Assessment & Plan:    1. BPH with LUTS IPSS score is 22/5,  it is worsened Continue conservative management, avoiding bladder irritants and timed voiding's Most bothersome symptom is urgency and urge incontinence Restart tamsulosin 0.4 mg and continue finasteride 5 mg daily  RTC in 6 months for I PSS and PVR  Return in about 6 months (around 08/27/2020) for IPSS and PVR .  Michiel Cowboy, PA-C  [facility] [address] [place], [place] [address] [phone]

## 2020-02-28 ENCOUNTER — Other Ambulatory Visit: Payer: Self-pay

## 2020-02-28 ENCOUNTER — Encounter: Payer: Self-pay | Admitting: Urology

## 2020-02-28 ENCOUNTER — Ambulatory Visit (INDEPENDENT_AMBULATORY_CARE_PROVIDER_SITE_OTHER): Payer: Medicare HMO | Admitting: Urology

## 2020-02-28 VITALS — BP 184/82 | HR 76 | Ht 63.0 in | Wt 140.0 lb

## 2020-02-28 DIAGNOSIS — N401 Enlarged prostate with lower urinary tract symptoms: Secondary | ICD-10-CM | POA: Diagnosis not present

## 2020-02-28 DIAGNOSIS — N138 Other obstructive and reflux uropathy: Secondary | ICD-10-CM | POA: Diagnosis not present

## 2020-02-28 LAB — BLADDER SCAN AMB NON-IMAGING: Scan Result: 127

## 2020-02-28 MED ORDER — TAMSULOSIN HCL 0.4 MG PO CAPS: 0.4000 mg | ORAL_CAPSULE | Freq: Every day | ORAL | 3 refills | Status: DC

## 2020-02-28 MED ORDER — FINASTERIDE 5 MG PO TABS: ORAL_TABLET | ORAL | 3 refills | Status: DC

## 2020-02-29 LAB — URINALYSIS, COMPLETE
Bilirubin, UA: NEGATIVE
Glucose, UA: NEGATIVE
Ketones, UA: NEGATIVE
Nitrite, UA: NEGATIVE
Specific Gravity, UA: 1.02 (ref 1.005–1.030)
Urobilinogen, Ur: 0.2 mg/dL (ref 0.2–1.0)
pH, UA: 7 (ref 5.0–7.5)

## 2020-02-29 LAB — MICROSCOPIC EXAMINATION: Bacteria, UA: NONE SEEN

## 2020-04-10 IMAGING — MR MR HEAD W/O CM
10 of 11 series · 43 of 48 positions shown · non-contrast
Comparison: 12/18/2017 CT head and CTA head.

CLINICAL DATA: 86 y/o  M; TIA, initial exam.

EXAM:
MRI HEAD WITHOUT CONTRAST
TECHNIQUE: Multiplanar, multiecho pulse sequences of the brain and surrounding
structures were obtained without intravenous contrast.

[Series 9: ax dwi_tracew · axial · 3.0mm · 1.50mm/px · z∈[-2,+139]mm · 8 of 84 slices shown]
[im 1/84]
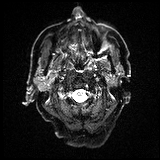
[im 12/84]
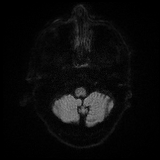
[im 24/84]
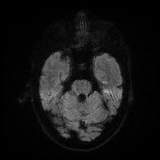
[im 36/84]
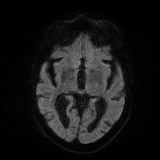
[im 48/84]
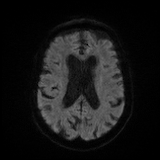
[im 60/84]
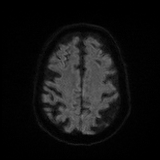
[im 72/84]
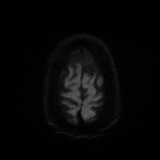
[im 84/84]
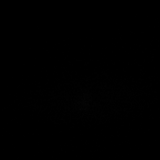

[Series 10: ax dwi_adc · axial · 3.0mm · 1.50mm/px · z∈[-2,+139]mm · 4 of 42 slices shown]
[im 1/42]
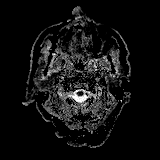
[im 14/42]
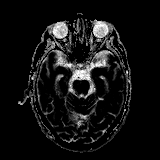
[im 28/42]
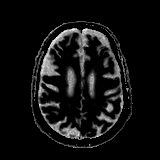
[im 42/42]
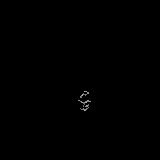

[Series 11: cor dwi_tracew · coronal · 5.0mm · 1.44mm/px · 7 of 72 slices shown]
[im 1/72]
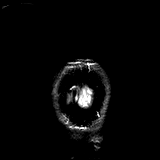
[im 12/72]
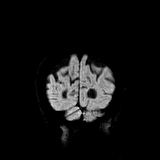
[im 24/72]
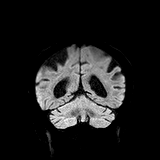
[im 36/72]
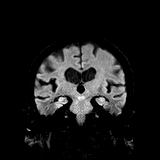
[im 48/72]
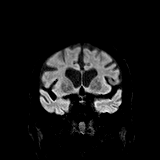
[im 60/72]
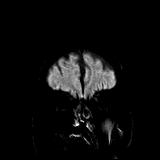
[im 72/72]
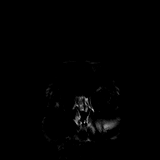

[Series 12: cor dwi_adc · coronal · 5.0mm · 1.44mm/px · 4 of 36 slices shown]
[im 1/36]
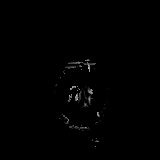
[im 12/36]
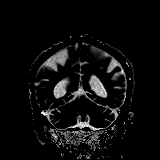
[im 24/36]
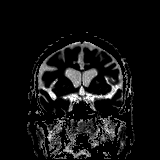
[im 36/36]
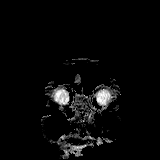

[Series 13: T1 · sagittal · 5.0mm · 0.75mm/px · 2 of 24 slices shown]
[im 1/24]
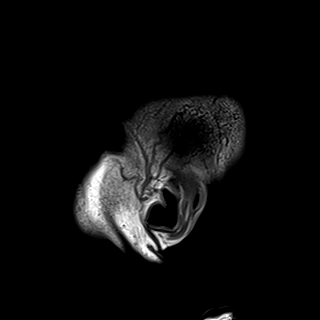
[im 24/24]
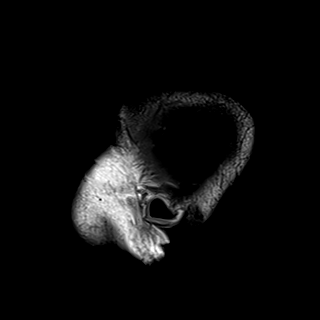

[Series 14: T2 · axial · 5.0mm · 0.72mm/px · z∈[-2,+142]mm · 3 of 25 slices shown (1 of 2)]
[im 1/25]
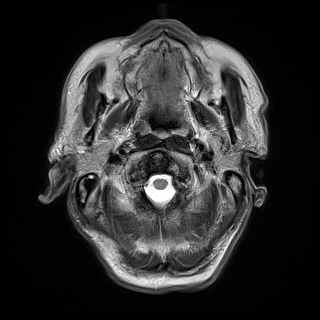
[im 13/25]
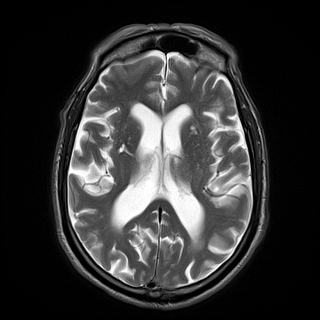
[im 25/25]
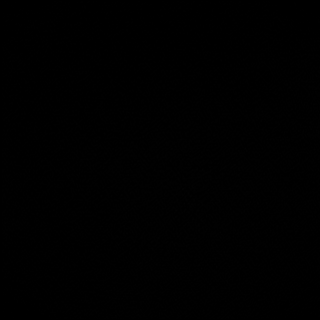

[Series 15: FLAIR · axial · 5.0mm · 0.45mm/px · z∈[-3,+141]mm · 3 of 25 slices shown]
[im 1/25]
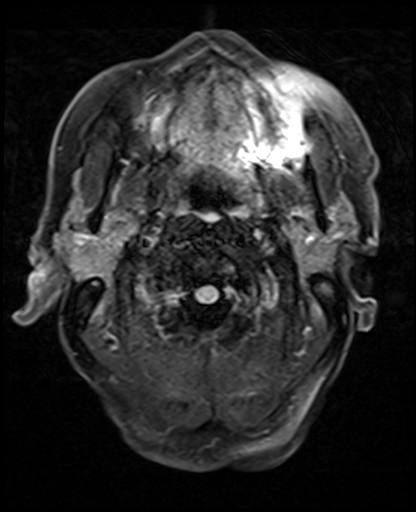
[im 13/25]
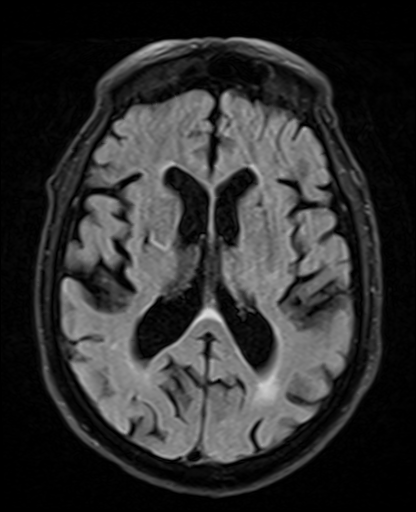
[im 25/25]
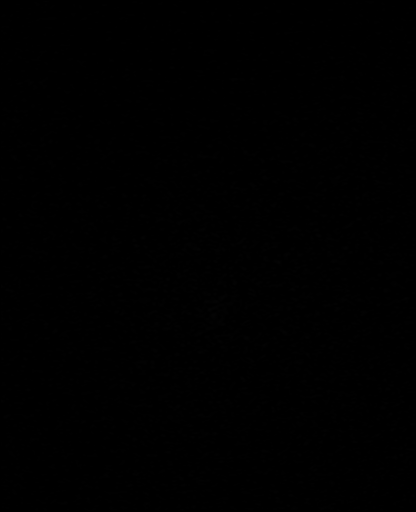

[Series 16: swi_images · axial · 3.0mm · 0.90mm/px · z∈[+1,+141]mm · 5 of 48 slices shown]
[im 1/48]
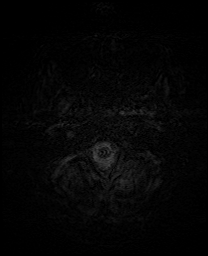
[im 12/48]
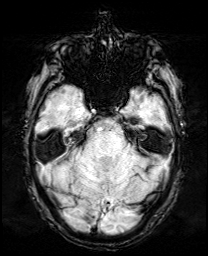
[im 24/48]
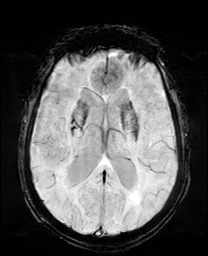
[im 36/48]
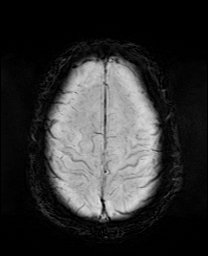
[im 48/48]
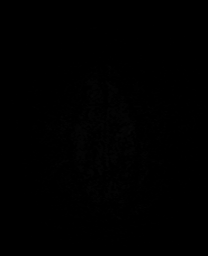

[Series 17: mip_images(sw) · axial · 24.0mm · 0.90mm/px · z∈[+11,+131]mm · 4 of 41 slices shown]
[im 1/41]
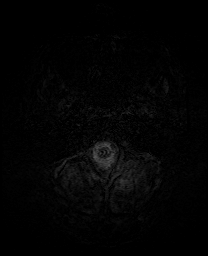
[im 14/41]
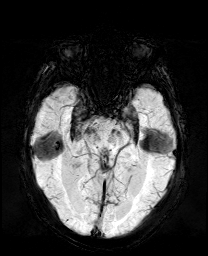
[im 27/41]
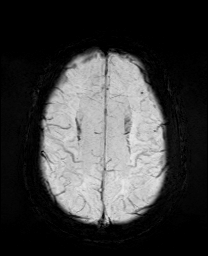
[im 41/41]
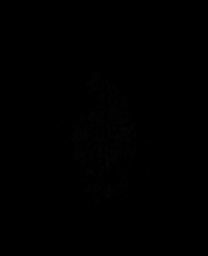

[Series 19: T2 · coronal · 5.0mm · 0.34mm/px · 3 of 30 slices shown (2 of 2)]
[im 1/30]
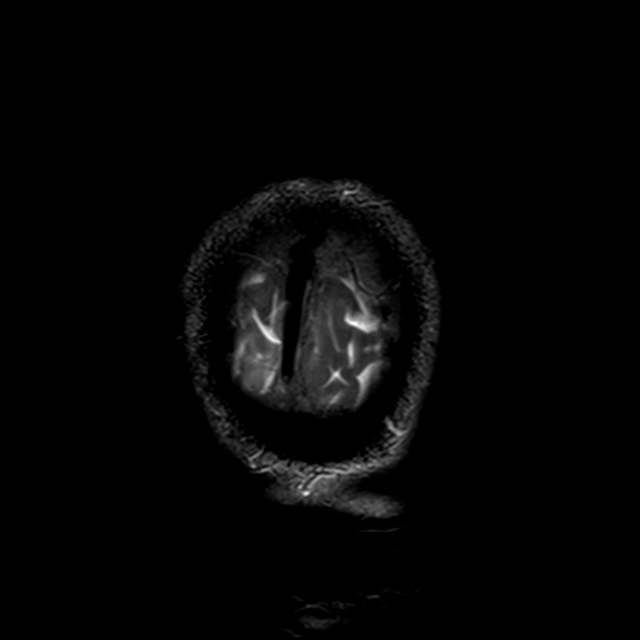
[im 15/30]
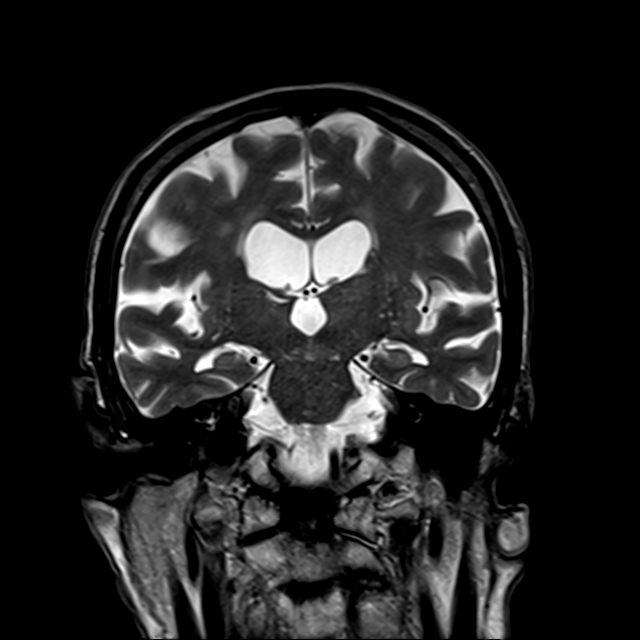
[im 30/30]
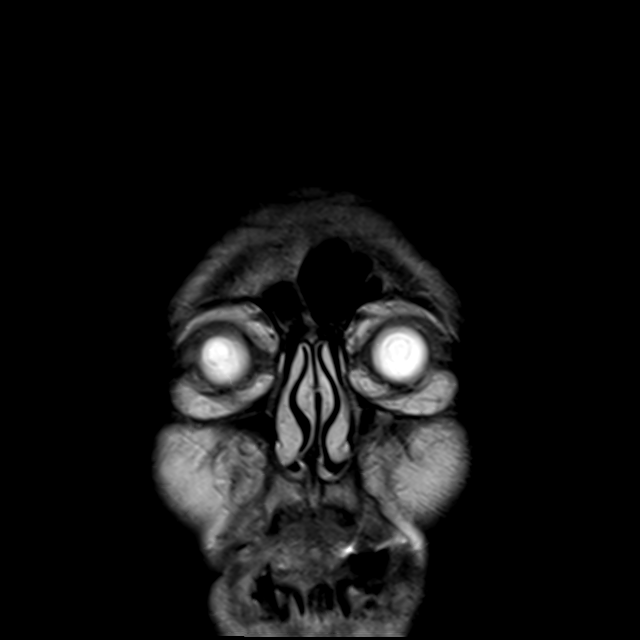

[43 of 48 positions shown; findings below may reference images not displayed]

FINDINGS: Brain: No acute infarction, hemorrhage, hydrocephalus, extra-axial
collection or mass lesion. Small chronic lacunar infarcts are
present within the right putamen extending into corona radiata, left
caudate head, and left caudate body. Several nonspecific foci of T2
FLAIR hyperintense signal abnormality in subcortical and
periventricular white matter as well as pons are compatible with
moderate chronic microvascular ischemic changes for age. Moderate
brain parenchymal volume loss.

Vascular: Normal flow voids.

Skull and upper cervical spine: Normal marrow signal.

Sinuses/Orbits: Negative.

Other: None.
IMPRESSION: 1. No acute intracranial abnormality.
2. Moderate chronic microvascular ischemic changes and parenchymal
volume loss of the brain.
3. Small chronic lacunar infarcts of the basal ganglia.

By: Ourari Tiger M.D.

## 2020-08-27 NOTE — Progress Notes (Signed)
08/28/2020 1:36 PM   Gary Lynn [birth year] [id number]  Referring provider: Sherrie Mustache, MD [address] [place],  [place] [address]  Chief Complaint  Patient presents with  . Benign Prostatic Hypertrophy   Urological history: 1. Urethral stricture - difficult foley placement with filiform's and followers in 2014 by Dr. Edwyna Shell for a PVR of 500 mL - PVR 59 mL  2. BPH with LU TS - I PSS 14/2 - managed with finasteride 5 mg daily and tamsulosin 0.4 mg daily   HPI: Gary Lynn is a 87 y.o. male who presents today for a 6 month follow up with his daughter, [name].   He is having urinary incontinence for which he wears depends.  He states he is wet most days.  He states the urine just comes out without his realization.  He is having very rare days of being dry.   IPSS    Row Name 08/28/20 1300         International Prostate Symptom Score   How often have you had the sensation of not emptying your bladder? Less than half the time     How often have you had to urinate less than every two hours? Less than 1 in 5 times     How often have you found you stopped and started again several times when you urinated? Less than 1 in 5 times     How often have you found it difficult to postpone urination? More than half the time     How often have you had a weak urinary stream? Less than 1 in 5 times     How often have you had to strain to start urination? Less than 1 in 5 times     How many times did you typically get up at night to urinate? 4 Times     Total IPSS Score 14           Quality of Life due to urinary symptoms   If you were to spend the rest of your life with your urinary condition just the way it is now how would you feel about that? Mostly Satisfied            Score:  1-7 Mild 8-19 Moderate 20-35 Severe    PMH: Past Medical History:  Diagnosis Date  . Carotid artery stenosis    a. carotid doppler 05/2016: RICA 40-59%, LICA 1-39%  . Chronic diastolic CHF  (congestive heart failure) (HCC)    a. echo 05/2016: EF 55-60%, AK of the basal-midinferolateral and inferior myocardium (c/w prior study), GR1DD, aortic sclerosis without stenosis, mild MR, PASP 40 mmHg  . Coronary artery disease    Cardiac cath in February of 2013: EF 60%, high-grade chronic disease in OM 1, occluded mid RCA at the site of a previously placed stent with left-to-right collaterals, occluded left SFA.  . Diabetes mellitus with complication (HCC)   . Hypercholesterolemia   . Hypertension   . PAF (paroxysmal atrial fibrillation) (HCC)    a. Zio monitor 05/2016: predominant rhyhtm of sinus with intermittent episodes of Afib/flutter with the longest episode being 6 hours and 51 minutes with an average heart rate of 114 bpm. Occasional PACs and PVCs. b. CHADS2VASc --> 6 (CHF, HTN, age x 2, DM, vascular disease)    Surgical History: Past Surgical History:  Procedure Laterality Date  . BACK SURGERY     years ago  . CARDIAC CATHETERIZATION  07/2007   [facility]  .  CARDIAC CATHETERIZATION  07/2011   [facility]  . CORONARY ANGIOPLASTY WITH STENT PLACEMENT      Home Medications:  Allergies as of 08/28/2020   No Known Allergies     Medication List       Accurate as of August 28, 2020  1:36 PM. If you have any questions, ask your nurse or doctor.        STOP taking these medications   fesoterodine 4 MG Tb24 tablet Commonly known as: TOVIAZ Stopped by: Lorimer Tiberio, PA-C   oxybutynin 5 MG tablet Commonly known as: DITROPAN Stopped by: Michiel Cowboy, PA-C     TAKE these medications   amLODipine 5 MG tablet Commonly known as: NORVASC Take 5 mg by mouth daily.   atorvastatin 80 MG tablet Commonly known as: LIPITOR Take 1 tablet (80 mg total) by mouth daily at 6 PM.   atorvastatin 40 MG tablet Commonly known as: LIPITOR   ferrous sulfate 325 (65 FE) MG tablet Take 325 mg by mouth daily.   finasteride 5 MG tablet Commonly known as: PROSCAR TAKE 1 TABLET(5 MG) BY MOUTH  DAILY   fish oil-omega-3 fatty acids 1000 MG capsule Take 1 g by mouth 2 (two) times daily.   glipiZIDE 5 MG 24 hr tablet Commonly known as: GLUCOTROL XL Take 5 mg by mouth daily before supper.   linagliptin 5 MG Tabs tablet Commonly known as: TRADJENTA Take 5 mg by mouth daily before supper.   lisinopril 10 MG tablet Commonly known as: ZESTRIL Take 20 mg by mouth 2 (two) times daily before a meal.   Magnesium 250 MG Tabs Take 250 mg by mouth daily.   mirabegron ER 25 MG Tb24 tablet Commonly known as: MYRBETRIQ Take 1 tablet (25 mg total) by mouth daily. Started by: Michiel Cowboy, PA-C   OneTouch Verio test strip Generic drug: glucose blood   ranitidine 150 MG tablet Commonly known as: Zantac Take 1 tablet (150 mg total) by mouth daily.   Rivaroxaban 15 MG Tabs tablet Commonly known as: Xarelto Take 1 tablet (15 mg total) by mouth daily with supper.   tamsulosin 0.4 MG Caps capsule Commonly known as: FLOMAX Take 1 capsule (0.4 mg total) by mouth daily.       Allergies: No Known Allergies  Family History: Family History  Problem Relation Age of Onset  . Dementia Mother   . Heart attack Father   . Kidney disease Neg Hx   . Prostate cancer Neg Hx     Social History:  reports that he has been smoking cigarettes. He has a 25.00 pack-year smoking history. He has never used smokeless tobacco. He reports that he does not drink alcohol and does not use drugs.  ROS: For pertinent review of systems please refer to history of present illness  Physical Exam: BP (!) 182/60   Pulse 82   Ht 5\' 3"  (1.6 m)   Wt 140 lb (63.5 kg)   BMI 24.80 kg/m   Constitutional:  Well nourished. Alert and oriented, No acute distress. HEENT: [place] AT, mask in place.  Trachea midline Cardiovascular: No clubbing, cyanosis, or edema. Respiratory: Normal respiratory effort, no increased work of breathing. Neurologic: Grossly intact, no focal deficits, moving all 4  extremities. Psychiatric: Normal mood and affect.   Laboratory Data: No recent labs  Pertinent ImagingResults for Gary Lynn, Gary Lynn (MRN Lequita Halt) as of 08/28/2020 13:33  Ref. Range 08/28/2020 13:11  Scan Result Unknown 59      Assessment & Plan:  1. BPH with LUTS - IPSS score is improved - Continue conservative management, avoiding bladder irritants and timed voiding's - Most bothersome symptom is urinary leakage - Continue tamsulosin 0.4 mg and continue finasteride 5 mg daily   2. Incontinence - will have a trial of Myrbetriq 25 mg daily, # 28 samples given   Return in about 3 weeks (around 09/18/2020) for IPSS and PVR.  Michiel Cowboy, PA-C  [facility] [address] [place], [place] [address] [phone]

## 2020-08-28 ENCOUNTER — Ambulatory Visit: Payer: Medicare HMO | Admitting: Urology

## 2020-08-28 ENCOUNTER — Encounter: Payer: Self-pay | Admitting: Urology

## 2020-08-28 ENCOUNTER — Other Ambulatory Visit: Payer: Self-pay

## 2020-08-28 VITALS — BP 182/60 | HR 82 | Ht 63.0 in | Wt 140.0 lb

## 2020-08-28 DIAGNOSIS — N138 Other obstructive and reflux uropathy: Secondary | ICD-10-CM

## 2020-08-28 DIAGNOSIS — N401 Enlarged prostate with lower urinary tract symptoms: Secondary | ICD-10-CM

## 2020-08-28 LAB — BLADDER SCAN AMB NON-IMAGING: Scan Result: 59

## 2020-08-28 MED ORDER — MIRABEGRON ER 25 MG PO TB24: 25.0000 mg | ORAL_TABLET | Freq: Every day | ORAL | 0 refills | Status: DC

## 2020-09-18 ENCOUNTER — Ambulatory Visit: Payer: Self-pay | Admitting: Urology

## 2020-09-19 ENCOUNTER — Ambulatory Visit: Payer: Self-pay | Admitting: Urology

## 2020-09-22 NOTE — Progress Notes (Signed)
09/23/2020 11:09 AM   Gary Lynn 20-Sep-1930 [id number]  Referring provider: Sherrie Mustache, MD [address] [place],  [place] [address]  Chief Complaint  Patient presents with  . Benign Prostatic Hypertrophy   Urological history: 1. Urethral stricture - difficult foley placement with filiform's and followers in 2014 by Dr. Edwyna Shell for a PVR of 500 mL - PVR 67 mL  2. BPH with LU TS - I PSS 13/1 - managed with finasteride 5 mg daily and tamsulosin 0.4 mg daily   3. Urge incontinence -managed with Myrbetriq 25 mg  HPI: Gary Lynn is a 87 y.o. male who presents today for a one month follow up with his daughter, [name], after a trial of Myrbetriq 25 mg daily.     Patient denies any modifying or aggravating factors.  Patient denies any gross hematuria, dysuria or suprapubic/flank pain.  Patient denies any fevers, chills, nausea or vomiting.   He states the Myrbetriq 25 mg samples have decreased his urinary frequency and urge incontinence and would like to continue the medication.   IPSS    Row Name 09/23/20 1100         International Prostate Symptom Score   How often have you had the sensation of not emptying your bladder? Less than 1 in 5     How often have you had to urinate less than every two hours? Less than half the time     How often have you found you stopped and started again several times when you urinated? Less than half the time     How often have you found it difficult to postpone urination? Less than half the time     How often have you had a weak urinary stream? Less than half the time     How often have you had to strain to start urination? Less than half the time     How many times did you typically get up at night to urinate? 2 Times     Total IPSS Score 13           Quality of Life due to urinary symptoms   If you were to spend the rest of your life with your urinary condition just the way it is now how would you feel about that? Pleased             Score:  1-7 Mild 8-19 Moderate 20-35 Severe    PMH: Past Medical History:  Diagnosis Date  . Carotid artery stenosis    a. carotid doppler 05/2016: RICA 40-59%, LICA 1-39%  . Chronic diastolic CHF (congestive heart failure) (HCC)    a. echo 05/2016: EF 55-60%, AK of the basal-midinferolateral and inferior myocardium (c/w prior study), GR1DD, aortic sclerosis without stenosis, mild MR, PASP 40 mmHg  . Coronary artery disease    Cardiac cath in February of 2013: EF 60%, high-grade chronic disease in OM 1, occluded mid RCA at the site of a previously placed stent with left-to-right collaterals, occluded left SFA.  . Diabetes mellitus with complication (HCC)   . Hypercholesterolemia   . Hypertension   . PAF (paroxysmal atrial fibrillation) (HCC)    a. Zio monitor 05/2016: predominant rhyhtm of sinus with intermittent episodes of Afib/flutter with the longest episode being 6 hours and 51 minutes with an average heart rate of 114 bpm. Occasional PACs and PVCs. b. CHADS2VASc --> 6 (CHF, HTN, age x 2, DM, vascular disease)    Surgical History: Past Surgical History:  Procedure Laterality Date  . BACK SURGERY     years ago  . CARDIAC CATHETERIZATION  07/2007   [facility]  . CARDIAC CATHETERIZATION  07/2011   [facility]  . CORONARY ANGIOPLASTY WITH STENT PLACEMENT      Home Medications:  Allergies as of 09/23/2020   No Known Allergies     Medication List       Accurate as of September 23, 2020 11:09 AM. If you have any questions, ask your nurse or doctor.        amLODipine 5 MG tablet Commonly known as: NORVASC Take 5 mg by mouth daily.   atorvastatin 80 MG tablet Commonly known as: LIPITOR Take 1 tablet (80 mg total) by mouth daily at 6 PM.   atorvastatin 40 MG tablet Commonly known as: LIPITOR   ferrous sulfate 325 (65 FE) MG tablet Take 325 mg by mouth daily.   finasteride 5 MG tablet Commonly known as: PROSCAR TAKE 1 TABLET(5 MG) BY MOUTH DAILY   Lynn oil-omega-3 fatty  acids 1000 MG capsule Take 1 g by mouth 2 (two) times daily.   glipiZIDE 5 MG 24 hr tablet Commonly known as: GLUCOTROL XL Take 5 mg by mouth daily before supper.   linagliptin 5 MG Tabs tablet Commonly known as: TRADJENTA Take 5 mg by mouth daily before supper.   lisinopril 10 MG tablet Commonly known as: ZESTRIL Take 20 mg by mouth 2 (two) times daily before a meal.   Magnesium 250 MG Tabs Take 250 mg by mouth daily.   mirabegron ER 25 MG Tb24 tablet Commonly known as: MYRBETRIQ Take 1 tablet (25 mg total) by mouth daily.   OneTouch Verio test strip Generic drug: glucose blood   ranitidine 150 MG tablet Commonly known as: Zantac Take 1 tablet (150 mg total) by mouth daily.   Rivaroxaban 15 MG Tabs tablet Commonly known as: Xarelto Take 1 tablet (15 mg total) by mouth daily with supper.   tamsulosin 0.4 MG Caps capsule Commonly known as: FLOMAX Take 1 capsule (0.4 mg total) by mouth daily.       Allergies: No Known Allergies  Family History: Family History  Problem Relation Age of Onset  . Dementia Mother   . Heart attack Father   . Kidney disease Neg Hx   . Prostate cancer Neg Hx     Social History:  reports that he has been smoking cigarettes. He has a 25.00 pack-year smoking history. He has never used smokeless tobacco. He reports that he does not drink alcohol and does not use drugs.  ROS: For pertinent review of systems please refer to history of present illness  Physical Exam: BP 128/74   Pulse 72   Ht 5\' 3"  (1.6 m)   Wt 140 lb (63.5 kg)   BMI 24.80 kg/m   Constitutional:  Well nourished. Alert and oriented, No acute distress. HEENT: [place] AT, mask in place.  Trachea midline Cardiovascular: No clubbing, cyanosis, or edema. Respiratory: Normal respiratory effort, no increased work of breathing. Neurologic: Grossly intact, no focal deficits, moving all 4 extremities. Psychiatric: Normal mood and affect.   Laboratory Data: No recent  labs  Pertinent ImagingResults for Gary, Lynn (MRN Lequita Halt) as of 08/28/2020 13:33  Ref. Range 08/28/2020 13:11  Scan Result Unknown 59      Assessment & Plan:    1. BPH with LUTS - IPSS score is improved - Continue conservative management, avoiding bladder irritants and timed voiding's - Most bothersome symptom is urinary  leakage - Continue tamsulosin 0.4 mg and continue finasteride 5 mg daily   2. Incontinence -At goal Myrbetriq 25 mg daily, I have given him #28 samples and a prescription is sent to his pharmacy as we will likely need a prior authorization to continue the medication  Return in about 3 months (around 12/23/2020) for IPSS and PVR.  Michiel Cowboy, PA-C  [facility] [address] [place], [place] [address] [phone]

## 2020-09-23 ENCOUNTER — Encounter: Payer: Self-pay | Admitting: Urology

## 2020-09-23 ENCOUNTER — Other Ambulatory Visit: Payer: Self-pay

## 2020-09-23 ENCOUNTER — Ambulatory Visit: Payer: Medicare HMO | Admitting: Urology

## 2020-09-23 VITALS — BP 128/74 | HR 72 | Ht 63.0 in | Wt 140.0 lb

## 2020-09-23 DIAGNOSIS — N401 Enlarged prostate with lower urinary tract symptoms: Secondary | ICD-10-CM

## 2020-09-23 DIAGNOSIS — R32 Unspecified urinary incontinence: Secondary | ICD-10-CM

## 2020-09-23 DIAGNOSIS — N138 Other obstructive and reflux uropathy: Secondary | ICD-10-CM

## 2020-09-23 LAB — BLADDER SCAN AMB NON-IMAGING: Scan Result: 67

## 2020-09-23 MED ORDER — MIRABEGRON ER 25 MG PO TB24: 25.0000 mg | ORAL_TABLET | Freq: Every day | ORAL | 3 refills | Status: DC

## 2020-11-20 ENCOUNTER — Other Ambulatory Visit: Payer: Self-pay | Admitting: Urology

## 2020-11-20 DIAGNOSIS — N138 Other obstructive and reflux uropathy: Secondary | ICD-10-CM

## 2020-12-22 NOTE — Progress Notes (Signed)
12/23/2020 11:34 AM   Gary Lynn December 21, 1930 [id number]  Referring provider: Sherrie Mustache, MD [address] [place],  [place] [address]  Urological history: 1. Urethral stricture - difficult foley placement with filiform's and followers in 2014 by Dr. Edwyna Shell for a PVR of 500 mL - PVR 0 mL  2. BPH with LU TS - I PSS 13/2 - managed with finasteride 5 mg daily and tamsulosin 0.4 mg daily   3. Urge incontinence -managed with Myrbetriq 25 mg  Chief Complaint  Patient presents with   Benign Prostatic Hypertrophy     HPI: Gary Lynn is a 87 y.o. male who presents today for a one month follow up with his daughter, Gary Lynn, after a trial of Myrbetriq 25 mg daily.     He has no urinary complaints at this visit.  Patient denies any modifying or aggravating factors.  Patient denies any gross hematuria, dysuria or suprapubic/flank pain.  Patient denies any fevers, chills, nausea or vomiting.    PVR is 0 mL.    IPSS     Row Name 12/23/20 1100         International Prostate Symptom Score   How often have you had the sensation of not emptying your bladder? Not at All     How often have you had to urinate less than every two hours? Less than half the time     How often have you found you stopped and started again several times when you urinated? Less than half the time     How often have you found it difficult to postpone urination? Less than half the time     How often have you had a weak urinary stream? Less than half the time     How often have you had to strain to start urination? Less than 1 in 5 times     How many times did you typically get up at night to urinate? 4 Times     Total IPSS Score 13           Quality of Life due to urinary symptoms     If you were to spend the rest of your life with your urinary condition just the way it is now how would you feel about that? Mostly Satisfied              Score:  1-7 Mild 8-19 Moderate 20-35  Severe    PMH: Past Medical History:  Diagnosis Date   Carotid artery stenosis    a. carotid doppler 05/2016: RICA 40-59%, LICA 1-39%   Chronic diastolic CHF (congestive heart failure) (HCC)    a. echo 05/2016: EF 55-60%, AK of the basal-midinferolateral and inferior myocardium (c/w prior study), GR1DD, aortic sclerosis without stenosis, mild MR, PASP 40 mmHg   Coronary artery disease    Cardiac cath in February of 2013: EF 60%, high-grade chronic disease in OM 1, occluded mid RCA at the site of a previously placed stent with left-to-right collaterals, occluded left SFA.   Diabetes mellitus with complication (HCC)    Hypercholesterolemia    Hypertension    PAF (paroxysmal atrial fibrillation) (HCC)    a. Zio monitor 05/2016: predominant rhyhtm of sinus with intermittent episodes of Afib/flutter with the longest episode being 6 hours and 51 minutes with an average heart rate of 114 bpm. Occasional PACs and PVCs. b. CHADS2VASc --> 6 (CHF, HTN, age x 2, DM, vascular disease)    Surgical History: Past Surgical History:  Procedure Laterality Date   BACK SURGERY     years ago   CARDIAC CATHETERIZATION  07/2007   [facility]   CARDIAC CATHETERIZATION  07/2011   [facility]   CORONARY ANGIOPLASTY WITH STENT PLACEMENT      Home Medications:  Allergies as of 12/23/2020   No Known Allergies      Medication List        Accurate as of December 23, 2020 11:34 AM. If you have any questions, ask your nurse or doctor.          amLODipine 5 MG tablet Commonly known as: NORVASC Take 5 mg by mouth daily.   atorvastatin 80 MG tablet Commonly known as: LIPITOR Take 1 tablet (80 mg total) by mouth daily at 6 PM.   atorvastatin 40 MG tablet Commonly known as: LIPITOR   ferrous sulfate 325 (65 FE) MG tablet Take 325 mg by mouth daily.   finasteride 5 MG tablet Commonly known as: PROSCAR TAKE 1 TABLET(5 MG) BY MOUTH DAILY   fish oil-omega-3 fatty acids 1000 MG capsule Take 1 g by mouth 2 (two)  times daily.   glipiZIDE 5 MG 24 hr tablet Commonly known as: GLUCOTROL XL Take 5 mg by mouth daily before supper.   linagliptin 5 MG Tabs tablet Commonly known as: TRADJENTA Take 5 mg by mouth daily before supper.   lisinopril 10 MG tablet Commonly known as: ZESTRIL Take 20 mg by mouth 2 (two) times daily before a meal.   Magnesium 250 MG Tabs Take 250 mg by mouth daily.   mirabegron ER 25 MG Tb24 tablet Commonly known as: MYRBETRIQ Take 1 tablet (25 mg total) by mouth daily.   OneTouch Verio test strip Generic drug: glucose blood   ranitidine 150 MG tablet Commonly known as: Zantac Take 1 tablet (150 mg total) by mouth daily.   Rivaroxaban 15 MG Tabs tablet Commonly known as: Xarelto Take 1 tablet (15 mg total) by mouth daily with supper.   tamsulosin 0.4 MG Caps capsule Commonly known as: FLOMAX Take 1 capsule (0.4 mg total) by mouth daily.        Allergies: No Known Allergies  Family History: Family History  Problem Relation Age of Onset   Dementia Mother    Heart attack Father    Kidney disease Neg Hx    Prostate cancer Neg Hx     Social History:  reports that he has been smoking cigarettes. He has a 25.00 pack-year smoking history. He has never used smokeless tobacco. He reports that he does not drink alcohol and does not use drugs.  ROS: For pertinent review of systems please refer to history of present illness  Physical Exam: BP 135/65   Pulse 85   Ht 5\' 3"  (1.6 m)   Wt 140 lb (63.5 kg)   BMI 24.80 kg/m   Constitutional:  Well nourished. Alert and oriented, No acute distress. HEENT: [place] AT, mask in place  Trachea midline Cardiovascular: No clubbing, cyanosis, or edema. Respiratory: Normal respiratory effort, no increased work of breathing. Neurologic: Grossly intact, no focal deficits, moving all 4 extremities. Psychiatric: Normal mood and affect.    Laboratory Data: No recent labs  Pertinent Imaging Results for Gary Lynn, Gary Lynn (MRN  Gary Lynn) as of 12/23/2020 11:29  Ref. Range 12/23/2020 11:02  Scan Result Unknown 0     Assessment & Plan:    1. BPH with LUTS -Continue tamsulosin 0.4 mg and continue finasteride 5 mg daily   2. Incontinence -continue  Myrbetriq 25 mg daily  Return in about 1 year (around 12/23/2021) for IPSS and PVR.  Michiel Cowboy, PA-C  [facility] [address] [place], [place] [address] [phone]

## 2020-12-23 ENCOUNTER — Other Ambulatory Visit: Payer: Self-pay

## 2020-12-23 ENCOUNTER — Encounter: Payer: Self-pay | Admitting: Urology

## 2020-12-23 ENCOUNTER — Ambulatory Visit: Payer: Medicare HMO | Admitting: Urology

## 2020-12-23 VITALS — BP 135/65 | HR 85 | Ht 63.0 in | Wt 140.0 lb

## 2020-12-23 DIAGNOSIS — R32 Unspecified urinary incontinence: Secondary | ICD-10-CM | POA: Diagnosis not present

## 2020-12-23 DIAGNOSIS — N138 Other obstructive and reflux uropathy: Secondary | ICD-10-CM | POA: Diagnosis not present

## 2020-12-23 DIAGNOSIS — N401 Enlarged prostate with lower urinary tract symptoms: Secondary | ICD-10-CM | POA: Diagnosis not present

## 2020-12-23 LAB — BLADDER SCAN AMB NON-IMAGING: Scan Result: 0

## 2021-04-09 ENCOUNTER — Other Ambulatory Visit: Payer: Self-pay | Admitting: Urology

## 2021-10-02 ENCOUNTER — Other Ambulatory Visit: Payer: Self-pay | Admitting: Urology

## 2021-11-15 ENCOUNTER — Other Ambulatory Visit: Payer: Self-pay | Admitting: Urology

## 2021-11-15 DIAGNOSIS — N401 Enlarged prostate with lower urinary tract symptoms: Secondary | ICD-10-CM

## 2021-12-21 NOTE — Progress Notes (Unsigned)
12/23/2021 2:18 PM   Gary Lynn July 21, 1930 [id number]  Referring provider: Sherrie Mustache, MD [address] [place],  [place] [address]  Urological history: 1. Urethral stricture - difficult foley placement with filiform's and followers in 2014 by Dr. Edwyna Shell for a PVR of 500 mL - PVR *** mL  2. BPH with LU TS - I PSS *** - managed with finasteride 5 mg daily and tamsulosin 0.4 mg daily   3. Urge incontinence -managed with Myrbetriq 25 mg  No chief complaint on file.    HPI: Gary Lynn is a 88 y.o. male who presents today for a one year follow up with his daughter, [name]***        Score:  1-7 Mild 8-19 Moderate 20-35 Severe    PMH: Past Medical History:  Diagnosis Date   Carotid artery stenosis    a. carotid doppler 05/2016: RICA 40-59%, LICA 1-39%   Chronic diastolic CHF (congestive heart failure) (HCC)    a. echo 05/2016: EF 55-60%, AK of the basal-midinferolateral and inferior myocardium (c/w prior study), GR1DD, aortic sclerosis without stenosis, mild MR, PASP 40 mmHg   Coronary artery disease    Cardiac cath in February of 2013: EF 60%, high-grade chronic disease in OM 1, occluded mid RCA at the site of a previously placed stent with left-to-right collaterals, occluded left SFA.   Diabetes mellitus with complication (HCC)    Hypercholesterolemia    Hypertension    PAF (paroxysmal atrial fibrillation) (HCC)    a. Zio monitor 05/2016: predominant rhyhtm of sinus with intermittent episodes of Afib/flutter with the longest episode being 6 hours and 51 minutes with an average heart rate of 114 bpm. Occasional PACs and PVCs. b. CHADS2VASc --> 6 (CHF, HTN, age x 2, DM, vascular disease)    Surgical History: Past Surgical History:  Procedure Laterality Date   BACK SURGERY     years ago   CARDIAC CATHETERIZATION  07/2007   [facility]   CARDIAC CATHETERIZATION  07/2011   [facility]   CORONARY ANGIOPLASTY WITH STENT PLACEMENT      Home Medications:  Allergies as  of 12/23/2021   No Known Allergies      Medication List        Accurate as of December 21, 2021  2:18 PM. If you have any questions, ask your nurse or doctor.          amLODipine 5 MG tablet Commonly known as: NORVASC Take 5 mg by mouth daily.   atorvastatin 80 MG tablet Commonly known as: LIPITOR Take 1 tablet (80 mg total) by mouth daily at 6 PM.   atorvastatin 40 MG tablet Commonly known as: LIPITOR   ferrous sulfate 325 (65 FE) MG tablet Take 325 mg by mouth daily.   finasteride 5 MG tablet Commonly known as: PROSCAR TAKE 1 TABLET(5 MG) BY MOUTH DAILY   fish oil-omega-3 fatty acids 1000 MG capsule Take 1 g by mouth 2 (two) times daily.   glipiZIDE 5 MG 24 hr tablet Commonly known as: GLUCOTROL XL Take 5 mg by mouth daily before supper.   linagliptin 5 MG Tabs tablet Commonly known as: TRADJENTA Take 5 mg by mouth daily before supper.   lisinopril 10 MG tablet Commonly known as: ZESTRIL Take 20 mg by mouth 2 (two) times daily before a meal.   Magnesium 250 MG Tabs Take 250 mg by mouth daily.   Myrbetriq 25 MG Tb24 tablet Generic drug: mirabegron ER TAKE 1 TABLET(25 MG) BY MOUTH  DAILY   OneTouch Verio test strip Generic drug: glucose blood   ranitidine 150 MG tablet Commonly known as: Zantac Take 1 tablet (150 mg total) by mouth daily.   Rivaroxaban 15 MG Tabs tablet Commonly known as: Xarelto Take 1 tablet (15 mg total) by mouth daily with supper.   tamsulosin 0.4 MG Caps capsule Commonly known as: FLOMAX TAKE 1 CAPSULE(0.4 MG) BY MOUTH DAILY        Allergies: No Known Allergies  Family History: Family History  Problem Relation Age of Onset   Dementia Mother    Heart attack Father    Kidney disease Neg Hx    Prostate cancer Neg Hx     Social History:  reports that he has been smoking cigarettes. He has a 25.00 pack-year smoking history. He has never used smokeless tobacco. He reports that he does not drink alcohol and does not use  drugs.  ROS: For pertinent review of systems please refer to history of present illness  Physical Exam: There were no vitals taken for this visit.  Constitutional:  Well nourished. Alert and oriented, No acute distress. HEENT: [place] AT, moist mucus membranes.  Trachea midline Cardiovascular: No clubbing, cyanosis, or edema. Respiratory: Normal respiratory effort, no increased work of breathing. GU: No CVA tenderness.  No bladder fullness or masses.  Patient with circumcised/uncircumcised phallus. ***Foreskin easily retracted***  Urethral meatus is patent.  No penile discharge. No penile lesions or rashes. Scrotum without lesions, cysts, rashes and/or edema.  Testicles are located scrotally bilaterally. No masses are appreciated in the testicles. Left and right epididymis are normal. Rectal: Patient with  normal sphincter tone. Anus and perineum without scarring or rashes. No rectal masses are appreciated. Prostate is approximately *** grams, *** nodules are appreciated. Seminal vesicles are normal. Neurologic: Grossly intact, no focal deficits, moving all 4 extremities. Psychiatric: Normal mood and affect.    Laboratory Data: N/A  Pertinent Imaging ***    Assessment & Plan:    1. BPH with LUTS -PSA stable -DRE benign -UA benign -PVR < 300 cc -symptoms - *** -most bothersome symptoms are *** -continue conservative management, avoiding bladder irritants and timed voiding's -Initiate alpha-blocker (***), discussed side effects *** -Initiate 5 alpha reductase inhibitor (***), discussed side effects *** -Continue tamsulosin 0.4 mg daily, alfuzosin 10 mg daily, Rapaflo 8 mg daily, terazosin, doxazosin, Cialis 5 mg daily and finasteride 5 mg daily, dutasteride 0.5 mg daily***:refills given -Cannot tolerate medication or medication failure, schedule cystoscopy ***   2. Incontinence -continue Myrbetriq 25 mg daily  No follow-ups on file.  Michiel Cowboy, PA-C  [facility] [address] [place], [place] [address] [phone]

## 2021-12-23 ENCOUNTER — Encounter: Payer: Self-pay | Admitting: Urology

## 2021-12-23 ENCOUNTER — Ambulatory Visit: Payer: Medicare HMO | Admitting: Urology

## 2021-12-23 VITALS — BP 162/60 | HR 76 | Ht 63.0 in | Wt 145.0 lb

## 2021-12-23 DIAGNOSIS — R32 Unspecified urinary incontinence: Secondary | ICD-10-CM

## 2021-12-23 DIAGNOSIS — N401 Enlarged prostate with lower urinary tract symptoms: Secondary | ICD-10-CM | POA: Diagnosis not present

## 2021-12-23 DIAGNOSIS — N138 Other obstructive and reflux uropathy: Secondary | ICD-10-CM | POA: Diagnosis not present

## 2021-12-23 LAB — BLADDER SCAN AMB NON-IMAGING: Scan Result: 47

## 2022-04-04 ENCOUNTER — Other Ambulatory Visit: Payer: Self-pay | Admitting: Urology

## 2022-11-10 ENCOUNTER — Other Ambulatory Visit: Payer: Self-pay | Admitting: Urology

## 2022-11-10 DIAGNOSIS — N401 Enlarged prostate with lower urinary tract symptoms: Secondary | ICD-10-CM

## 2022-12-23 NOTE — Progress Notes (Signed)
12/24/2022 11:16 AM   Gary Lynn [birth year] [id number]  Referring provider: Sherrie Mustache, MD [address] [place],  [place] [address]  Urological history: 1. Urethral stricture - difficult foley placement with filiform's and followers in 2014 by Dr. Edwyna Shell for a PVR of 500 mL  2. BPH with LU TS - managed with finasteride 5 mg daily and tamsulosin 0.4 mg daily   3. Urge incontinence -managed with Myrbetriq 25 mg  Chief Complaint  Patient presents with   Follow-up    1 year follow-up   Benign Prostatic Hypertrophy   HPI: Gary Lynn is a 89 y.o. male who presents today for a one year follow up with his daughter, Gary Lynn, and wife, Gary Lynn.   I PSS 14/2  PVR 28 mL  He does have some urinary hesitancy from time to time, but no incidence of urinary retention.   Patient denies any modifying or aggravating factors.  Patient denies any recent UTI's, gross hematuria, dysuria or suprapubic/flank pain.  Patient denies any fevers, chills, nausea or vomiting.  He is at goal with Flomax, finasteride and Myrbetriq.   IPSS     Row Name 12/24/22 1100         International Prostate Symptom Score   How often have you had the sensation of not emptying your bladder? Less than 1 in 5     How often have you had to urinate less than every two hours? Less than half the time     How often have you found you stopped and started again several times when you urinated? Not at All     How often have you found it difficult to postpone urination? More than half the time     How often have you had a weak urinary stream? About half the time     How often have you had to strain to start urination? Less than 1 in 5 times     How many times did you typically get up at night to urinate? 3 Times     Total IPSS Score 14       Quality of Life due to urinary symptoms   If you were to spend the rest of your life with your urinary condition just the way it is now how would you feel about that? Mostly  Satisfied                 Score:  1-7 Mild 8-19 Moderate 20-35 Severe    PMH: Past Medical History:  Diagnosis Date   Carotid artery stenosis    a. carotid doppler 05/2016: RICA 40-59%, LICA 1-39%   Chronic diastolic CHF (congestive heart failure) (HCC)    a. echo 05/2016: EF 55-60%, AK of the basal-midinferolateral and inferior myocardium (c/w prior study), GR1DD, aortic sclerosis without stenosis, mild MR, PASP 40 mmHg   Coronary artery disease    Cardiac cath in February of 2013: EF 60%, high-grade chronic disease in OM 1, occluded mid RCA at the site of a previously placed stent with left-to-right collaterals, occluded left SFA.   Diabetes mellitus with complication (HCC)    Hypercholesterolemia    Hypertension    PAF (paroxysmal atrial fibrillation) (HCC)    a. Zio monitor 05/2016: predominant rhyhtm of sinus with intermittent episodes of Afib/flutter with the longest episode being 6 hours and 51 minutes with an average heart rate of 114 bpm. Occasional PACs and PVCs. b. CHADS2VASc --> 6 (CHF, HTN, age x 2, DM, vascular  disease)    Surgical History: Past Surgical History:  Procedure Laterality Date   BACK SURGERY     years ago   CARDIAC CATHE[facility]IZATION  07/2007   [facility]   CARDIAC CATHETERIZATION  07/2011   [facility]   CORONARY ANGIOPLASTY WITH STENT PLACEMENT      Home Medications:  Allergies as of 12/24/2022   No Known Allergies      Medication List        Accurate as of December 24, 2022 11:16 AM. If you have any questions, ask your nurse or doctor.          amLODipine 5 MG tablet Commonly known as: NORVASC Take 5 mg by mouth daily.   atorvastatin 40 MG tablet Commonly known as: LIPITOR   ferrous sulfate 325 (65 FE) MG tablet Take 325 mg by mouth daily.   finasteride 5 MG tablet Commonly known as: PROSCAR TAKE 1 TABLET(5 MG) BY MOUTH DAILY   fish oil-omega-3 fatty acids 1000 MG capsule Take 1 g by mouth 2 (two) times daily.   glipiZIDE 5 MG 24 hr  tablet Commonly known as: GLUCOTROL XL Take 5 mg by mouth daily before supper.   lisinopril 10 MG tablet Commonly known as: ZESTRIL Take 20 mg by mouth 2 (two) times daily before a meal.   Magnesium 250 MG Tabs Take 250 mg by mouth daily.   mirabegron ER 25 MG Tb24 tablet Commonly known as: Myrbetriq TAKE 1 TABLET(25 MG) BY MOUTH DAILY   Rivaroxaban 15 MG Tabs tablet Commonly known as: Xarelto Take 1 tablet (15 mg total) by mouth daily with supper.   Rybelsus 7 MG Tabs Generic drug: Semaglutide Take 1 tablet by mouth daily.   tamsulosin 0.4 MG Caps capsule Commonly known as: FLOMAX TAKE 1 CAPSULE(0.4 MG) BY MOUTH DAILY        Allergies: No Known Allergies  Family History: Family History  Problem Relation Age of Onset   Dementia Mother    Heart attack Father    Kidney disease Neg Hx    Prostate cancer Neg Hx     Social History:  reports that he has been smoking cigarettes. He has a 25.00 pack-year smoking history. He has been exposed to tobacco smoke. He has never used smokeless tobacco. He reports that he does not drink alcohol and does not use drugs.  ROS: For pertinent review of systems please refer to history of present illness  Physical Exam: BP (!) 147/64   Pulse 83   Ht 5\' 3"  (1.6 m)   Wt 140 lb (63.5 kg)   BMI 24.80 kg/m   Constitutional:  Well nourished. Alert and oriented, No acute distress. HEENT: [place] AT, moist mucus membranes.  Trachea midline Cardiovascular: No clubbing, cyanosis, or edema. Respiratory: Normal respiratory effort, no increased work of breathing. Neurologic: Grossly intact, no focal deficits, moving all 4 extremities. Psychiatric: Normal mood and affect.    Laboratory Data: N/A  Pertinent Imaging:  12/24/22 11:05  Scan Result 28ml    Assessment & Plan:    1. BPH with LUTS -PVR < 300 cc -symptoms - frequency -continue conservative management, avoiding bladder irritants and timed voiding's -Continue tamsulosin 0.4 mg  daily and finasteride 5 mg daily   2. Incontinence -continue Myrbetriq 25 mg daily-samples given as patient is in donut hole  Return in about 1 year (around 12/24/2023) for IPSS and PVR.  Michiel Cowboy, PA-C  [facility] [address] [place], [place] [address] [phone]  [phone] 

## 2022-12-24 ENCOUNTER — Encounter: Payer: Self-pay | Admitting: Urology

## 2022-12-24 ENCOUNTER — Ambulatory Visit: Payer: Medicare HMO | Admitting: Urology

## 2022-12-24 VITALS — BP 147/64 | HR 83 | Ht 63.0 in | Wt 140.0 lb

## 2022-12-24 DIAGNOSIS — N138 Other obstructive and reflux uropathy: Secondary | ICD-10-CM

## 2022-12-24 DIAGNOSIS — R32 Unspecified urinary incontinence: Secondary | ICD-10-CM | POA: Diagnosis not present

## 2022-12-24 DIAGNOSIS — N401 Enlarged prostate with lower urinary tract symptoms: Secondary | ICD-10-CM | POA: Diagnosis not present

## 2022-12-24 LAB — BLADDER SCAN AMB NON-IMAGING

## 2022-12-24 MED ORDER — MIRABEGRON ER 25 MG PO TB24: ORAL_TABLET | ORAL | 3 refills | Status: DC

## 2023-03-31 ENCOUNTER — Other Ambulatory Visit: Payer: Self-pay

## 2023-03-31 MED ORDER — TAMSULOSIN HCL 0.4 MG PO CAPS: 0.4000 mg | ORAL_CAPSULE | Freq: Every day | ORAL | 3 refills | Status: DC

## 2023-04-11 ENCOUNTER — Telehealth: Payer: Self-pay | Admitting: Urology

## 2023-04-11 NOTE — Telephone Encounter (Signed)
Pt wants to know if Carollee Herter will give him samples of Myrbetriq 25mg  to last til the end of the year.  Please call [phone]

## 2023-04-11 NOTE — Telephone Encounter (Signed)
Spoke with daughter and advised results.  

## 2023-07-21 ENCOUNTER — Telehealth: Payer: Self-pay

## 2023-07-21 DIAGNOSIS — N3281 Overactive bladder: Secondary | ICD-10-CM

## 2023-07-21 DIAGNOSIS — R32 Unspecified urinary incontinence: Secondary | ICD-10-CM

## 2023-07-21 NOTE — Telephone Encounter (Signed)
Pt's daughter called again and stated that she will have [employer] send a prior authorization request for Myrbetriq in hopes that they will cover it.

## 2023-07-21 NOTE — Telephone Encounter (Signed)
Pt's daughter called stating that Theodore Demark is not on their insurance covered list, and would like to know if there is an alternative medication. Please advise.

## 2023-08-03 MED ORDER — MIRABEGRON ER 25 MG PO TB24: ORAL_TABLET | ORAL | 3 refills | Status: DC

## 2023-08-03 NOTE — Addendum Note (Signed)
Addended by: Michiel Cowboy A on: 08/03/2023 04:29 PM   Modules accepted: Orders

## 2023-08-03 NOTE — Telephone Encounter (Signed)
Would you please let Gary Lynn know that the prescription for the Myrbetriq was sent under [facility] and that is why the insurance will not cover it?  I am going to resend it under the diagnosis code of [facility].  Hopefully, this will fix the issue.

## 2023-08-04 NOTE — Telephone Encounter (Signed)
Notified patient Daughter as instructed,

## 2023-08-19 ENCOUNTER — Other Ambulatory Visit: Payer: Self-pay

## 2023-08-19 ENCOUNTER — Emergency Department: Payer: HMO

## 2023-08-19 ENCOUNTER — Emergency Department
Admission: EM | Admit: 2023-08-19 | Discharge: 2023-08-19 | Disposition: A | Discharge status: Home / Self Care | Payer: HMO | Attending: Emergency Medicine | Admitting: Emergency Medicine

## 2023-08-19 DIAGNOSIS — I509 Heart failure, unspecified: Secondary | ICD-10-CM | POA: Diagnosis not present

## 2023-08-19 DIAGNOSIS — Z7901 Long term (current) use of anticoagulants: Secondary | ICD-10-CM | POA: Insufficient documentation

## 2023-08-19 DIAGNOSIS — M7989 Other specified soft tissue disorders: Secondary | ICD-10-CM | POA: Insufficient documentation

## 2023-08-19 DIAGNOSIS — R21 Rash and other nonspecific skin eruption: Secondary | ICD-10-CM

## 2023-08-19 DIAGNOSIS — I2510 Atherosclerotic heart disease of native coronary artery without angina pectoris: Secondary | ICD-10-CM | POA: Insufficient documentation

## 2023-08-19 DIAGNOSIS — I110 Hypertensive heart disease with heart failure: Secondary | ICD-10-CM | POA: Insufficient documentation

## 2023-08-19 DIAGNOSIS — L249 Irritant contact dermatitis, unspecified cause: Secondary | ICD-10-CM | POA: Diagnosis not present

## 2023-08-19 LAB — BASIC METABOLIC PANEL
Anion gap: 9 (ref 5–15)
BUN: 22 mg/dL (ref 8–23)
CO2: 27 mmol/L (ref 22–32)
Calcium: 9 mg/dL (ref 8.9–10.3)
Chloride: 100 mmol/L (ref 98–111)
Creatinine, Ser: 1.18 mg/dL (ref 0.61–1.24)
GFR, Estimated: 59 mL/min — ABNORMAL LOW (ref >60)
Glucose, Bld: 135 mg/dL — ABNORMAL HIGH (ref 70–99)
Potassium: 4.4 mmol/L (ref 3.5–5.1)
Sodium: 136 mmol/L (ref 135–145)

## 2023-08-19 LAB — CBC
HCT: 40.2 % (ref 39.0–52.0)
Hemoglobin: 13.2 g/dL (ref 13.0–17.0)
MCH: 30.1 pg (ref 26.0–34.0)
MCHC: 32.8 g/dL (ref 30.0–36.0)
MCV: 91.6 fL (ref 80.0–100.0)
Platelets: 180 10*3/uL (ref 150–400)
RBC: 4.39 MIL/uL (ref 4.22–5.81)
RDW: 14.1 % (ref 11.5–15.5)
WBC: 9 10*3/uL (ref 4.0–10.5)
nRBC: 0 % (ref 0.0–0.2)

## 2023-08-19 LAB — APTT: aPTT: 32 s (ref 24–36)

## 2023-08-19 MED ORDER — FAMOTIDINE 20 MG PO TABS: 20.0000 mg | ORAL_TABLET | Freq: Two times a day (BID) | ORAL | 0 refills | Status: DC

## 2023-08-19 MED ORDER — TRIAMCINOLONE ACETONIDE 0.1 % EX OINT: 1.0000 | TOPICAL_OINTMENT | Freq: Two times a day (BID) | CUTANEOUS | 1 refills | Status: DC

## 2023-08-19 MED ORDER — CEPHALEXIN 500 MG PO CAPS
500.0000 mg | ORAL_CAPSULE | Freq: Once | ORAL | Status: AC
  Administered 2023-08-19: 500 mg via ORAL
  Filled 2023-08-19: qty 1

## 2023-08-19 MED ORDER — FAMOTIDINE 20 MG PO TABS
20.0000 mg | ORAL_TABLET | Freq: Once | ORAL | Status: AC
  Administered 2023-08-19: 20 mg via ORAL
  Filled 2023-08-19: qty 1

## 2023-08-19 MED ORDER — CEPHALEXIN 500 MG PO CAPS: 500.0000 mg | ORAL_CAPSULE | Freq: Three times a day (TID) | ORAL | 0 refills | Status: AC

## 2023-08-19 MED ORDER — PREDNISONE 20 MG PO TABS: 20.0000 mg | ORAL_TABLET | Freq: Two times a day (BID) | ORAL | 0 refills | Status: AC

## 2023-08-19 NOTE — Discharge Instructions (Signed)
 You are being treated for a dermatitis to the arm, as well as some nonspecific rash to the trunk and other extremities.  Your ultrasound study is negative at this time, with no evidence of a DVT.  You should take the prescription meds as directed, and follow-up with your primary provider or dermatology as discussed.

## 2023-08-19 NOTE — ED Provider Notes (Signed)
 [facility] Emergency Department Provider Note     Event Date/Time   First MD Initiated Contact with Patient 08/19/23 1830     (approximate)   History   Arm Swelling and Rash   HPI  Gary Lynn is a 89 y.o. male with a history of CAD, HTN, BPH, A-fib on Xarelto, CHF, and TIA/CVA, presents to the ED for evaluation of rashes to his bilateral arms.  Patient would endorse onset of itchy rash to the arms with onset last week.  He is unclear of any known exposures, contacts, or allergens.  He notes increased redness, thick and hot skin on the left arm laterally.  He also notes his areas to the anterior thighs bilaterally as well as his back that have been itchy.  Patient denies any history of eczema, allergies, or asthma.  He was evaluated by his PCP today, and advised to rule out a DVT to the left extremity.  No reports of chest pain, cough, shortness of breath, or hemoptysis.  Physical Exam   Triage Vital Signs: ED Triage Vitals [08/19/23 1454]  Encounter Vitals Group     BP (!) 159/63     Systolic BP Percentile      Diastolic BP Percentile      Pulse Rate 80     Resp 17     Temp 98.4 F (36.9 C)     Temp Source Oral     SpO2 99 %     Weight 139 lb 15.9 oz (63.5 kg)     Height 5\' 3"  (1.6 m)     Head Circumference      Peak Flow      Pain Score 0     Pain Loc      Pain Education      Exclude from Growth Chart     Most recent vital signs: Vitals:   08/19/23 1454 08/19/23 1841  BP: (!) 159/63 (!) 145/65  Pulse: 80 81  Resp: 17 18  Temp: 98.4 F (36.9 C)   SpO2: 99% 99%    General Awake, no distress. NAD HEENT NCAT. PERRL. EOMI. No rhinorrhea. Mucous membranes are moist.  CV:  Good peripheral perfusion. No CCE distally.  RESP:  Normal effort. CTA ABD:  No distention.  SKIN:  Patient with a well-demarcated area of hypertrophic skin to the dorsal LUE.  The skin is inflamed and irritated.  No active weeping noted.  Patient also noted to  have scattered maculopapular lesions to the anterior thighs bilaterally.  Similarly lesion noted to the dorsum of the right foot.  Scattered lesions noted to the trunk at the mid back.  No excoriations, intact blisters, or skin desquamation noted.  Noted to have a cystic mobile lesion to the anterior elbow on the left.   ED Results / Procedures / Treatments   Labs (all labs ordered are listed, but only abnormal results are displayed) Labs Reviewed  BASIC METABOLIC PANEL - Abnormal; Notable for the following components:      Result Value   Glucose, Bld 135 (*)    GFR, Estimated 59 (*)    All other components within normal limits  CBC  APTT    EKG    RADIOLOGY  I personally viewed and evaluated these images as part of my medical decision making, as well as reviewing the written report by the radiologist.  ED Provider Interpretation: No acute DVT  US Venous Img Upper Uni Left Result Date: 08/19/2023  CLINICAL DATA:  Left arm redness EXAM: Left UPPER EXTREMITY VENOUS DOPPLER ULTRASOUND TECHNIQUE: Gray-scale sonography with graded compression, as well as color Doppler and duplex ultrasound were performed to evaluate the upper extremity deep venous system from the level of the subclavian vein and including the jugular, axillary, basilic, radial, ulnar and upper cephalic vein. Spectral Doppler was utilized to evaluate flow at rest and with distal augmentation maneuvers. COMPARISON:  None Available. FINDINGS: Contralateral Subclavian Vein: Respiratory phasicity is normal and symmetric with the symptomatic side. No evidence of thrombus. Normal compressibility. Internal Jugular Vein: No evidence of thrombus. Normal compressibility, respiratory phasicity and response to augmentation. Subclavian Vein: No evidence of thrombus. Normal compressibility, respiratory phasicity and response to augmentation. Axillary Vein: No evidence of thrombus. Normal compressibility, respiratory phasicity and response to  augmentation. Cephalic Vein: No evidence of thrombus. Normal compressibility, respiratory phasicity and response to augmentation. Basilic Vein: No evidence of thrombus. Normal compressibility, respiratory phasicity and response to augmentation. Brachial Veins: No evidence of thrombus. Normal compressibility, respiratory phasicity and response to augmentation. Radial Veins: No evidence of thrombus. Normal compressibility, respiratory phasicity and response to augmentation. Ulnar Veins: No evidence of thrombus. Normal compressibility, respiratory phasicity and response to augmentation. Venous Reflux:  None visualized. Other Findings:  None visualized. IMPRESSION: No evidence of DVT within the left upper extremity. Electronically Signed   By: Minerva Fester M.D.   On: 08/19/2023 19:33    PROCEDURES:  Critical Care performed: No  Procedures   MEDICATIONS ORDERED IN ED: Medications  cephALEXin (KEFLEX) capsule 500 mg (has no administration in time range)  famotidine (PEPCID) tablet 20 mg (has no administration in time range)     IMPRESSION / MDM / ASSESSMENT AND PLAN / ED COURSE  I reviewed the triage vital signs and the nursing notes.                              Differential diagnosis includes, but is not limited to, contact dermatitis, irritant dermatitis, eczema exacerbation, cellulitis  Patient's presentation is most consistent with acute complicated illness / injury requiring diagnostic workup.  Patient's diagnosis is consistent with contact dermatitis, likely irritant etiology.  Direct patient with otherwise reassuring exam and workup at this time.  No abnormalities noted.  No evidence of sepsis or toxic appearance.  Low concern for developing infectious cellulitis, but given patient's diabetic status, would likely treat empirically with Keflex.  His ultrasound reviewed by me does not reveal any acute occlusive DVT to the left upper extremity.  Patient presents with a pruritic rash to  the extremities and trunk, with hypertrophic skin noted on the L UE, likely due to is right-hand-dominant.  Patient will be discharged home with prescriptions for Keflex, prednisone, triamcinolone/Eucerin, and famotidine. Patient is to follow up with his primary provider or dermatologist as discussed, as needed or otherwise directed. Patient is given ED precautions to return to the ED for any worsening or new symptoms.   FINAL CLINICAL IMPRESSION(S) / ED DIAGNOSES   Final diagnoses:  Irritant contact dermatitis, unspecified trigger  Rash and nonspecific skin eruption     Rx / DC Orders   ED Discharge Orders          Ordered    triamcinolone ointment (KENALOG) 0.1 %  2 times daily       Note to Pharmacy: Mix 1:1 with Eucerin   08/19/23 1956    famotidine (PEPCID) 20 MG tablet  2 times  daily        08/19/23 1956    cephALEXin (KEFLEX) 500 MG capsule  3 times daily        08/19/23 1956    predniSONE (DELTASONE) 20 MG tablet  2 times daily with meals        08/19/23 1956             Note:  This document was prepared using Dragon voice recognition software and may include unintentional dictation errors.    Lissa Hoard, PA-C 08/19/23 2019    Corena Herter, MD 08/19/23 2330

## 2023-08-19 NOTE — ED Triage Notes (Signed)
 Pt here with bilateral arm rashes and a possible blood clot in his left arm. Pt states his arms are itching, denies any new medications or foods. Pt denies pain in his left arm.

## 2023-09-20 ENCOUNTER — Encounter: Payer: Self-pay | Admitting: Urology

## 2023-09-20 ENCOUNTER — Ambulatory Visit (INDEPENDENT_AMBULATORY_CARE_PROVIDER_SITE_OTHER): Admitting: Urology

## 2023-09-20 VITALS — BP 159/80 | HR 78 | Ht 63.0 in | Wt 145.0 lb

## 2023-09-20 DIAGNOSIS — R32 Unspecified urinary incontinence: Secondary | ICD-10-CM

## 2023-09-20 DIAGNOSIS — N401 Enlarged prostate with lower urinary tract symptoms: Secondary | ICD-10-CM

## 2023-09-20 DIAGNOSIS — R3129 Other microscopic hematuria: Secondary | ICD-10-CM | POA: Diagnosis not present

## 2023-09-20 DIAGNOSIS — R3989 Other symptoms and signs involving the genitourinary system: Secondary | ICD-10-CM | POA: Diagnosis not present

## 2023-09-20 DIAGNOSIS — N138 Other obstructive and reflux uropathy: Secondary | ICD-10-CM

## 2023-09-20 LAB — BLADDER SCAN AMB NON-IMAGING

## 2023-09-20 LAB — URINALYSIS, COMPLETE
Bilirubin, UA: NEGATIVE
Glucose, UA: NEGATIVE
Ketones, UA: NEGATIVE
Nitrite, UA: POSITIVE — AB
Specific Gravity, UA: 1.025 (ref 1.005–1.030)
Urobilinogen, Ur: 1 mg/dL (ref 0.2–1.0)
pH, UA: 7 (ref 5.0–7.5)

## 2023-09-20 LAB — MICROSCOPIC EXAMINATION: WBC, UA: >30 /HPF — AB (ref 0–5)

## 2023-09-20 MED ORDER — CEFUROXIME AXETIL 250 MG PO TABS
250.0000 mg | ORAL_TABLET | Freq: Two times a day (BID) | ORAL | 0 refills | Status: DC
Start: 2023-09-20 — End: 2023-09-26

## 2023-09-20 NOTE — Progress Notes (Signed)
 09/20/2023 2:22 PM   Gary Lynn 29-Nov-1930 [id number]  Referring provider: Sherrie Mustache, MD [address] [place],  [place] [address]  Urological history: 1. Urethral stricture - difficult foley placement with filiform's and followers in 2014 by Dr. Edwyna Lynn for a PVR of 500 mL  2. BPH with LU TS - managed with finasteride 5 mg daily and tamsulosin 0.4 mg daily   3. Urge incontinence -managed with Myrbetriq 25 mg  Chief Complaint  Patient presents with   Urinary Frequency   HPI: Gary Lynn is a 89 y.o. male who presents today for going to bathroom every 30 minutes and feeling pressure with his daughter, Gary Lynn.    Previous records reviewed.     Last week he developed urinary urgency, urge incontinence, dysuria and voiding small amounts.  Patient denies any modifying or aggravating factors.  Patient denies any recent UTI's, gross hematuria, dysuria or suprapubic/flank pain.  Patient denies any fevers, chills, nausea or vomiting.    UA yellow cloudy, specific gravity 1.025, 1+ heme, pH 7.0, 3+ protein, nitrate positive, 1+ leukocyte, greater than 30 WBCs, 11-30 RBCs, 0-10 epithelial cells, hyaline casts present, mucus threads present many bacteria.  PVR 37 mL   PMH: Past Medical History:  Diagnosis Date   Carotid artery stenosis    a. carotid doppler 05/2016: RICA 40-59%, LICA 1-39%   Chronic diastolic CHF (congestive heart failure) (HCC)    a. echo 05/2016: EF 55-60%, AK of the basal-midinferolateral and inferior myocardium (c/w prior study), GR1DD, aortic sclerosis without stenosis, mild MR, PASP 40 mmHg   Coronary artery disease    Cardiac cath in February of 2013: EF 60%, high-grade chronic disease in OM 1, occluded mid RCA at the site of a previously placed stent with left-to-right collaterals, occluded left SFA.   Diabetes mellitus with complication (HCC)    Hypercholesterolemia    Hypertension    PAF (paroxysmal atrial fibrillation) (HCC)    a. Zio monitor  05/2016: predominant rhyhtm of sinus with intermittent episodes of Afib/flutter with the longest episode being 6 hours and 51 minutes with an average heart rate of 114 bpm. Occasional PACs and PVCs. b. CHADS2VASc --> 6 (CHF, HTN, age x 2, DM, vascular disease)    Surgical History: Past Surgical History:  Procedure Laterality Date   BACK SURGERY     years ago   CARDIAC CATHETERIZATION  07/2007   [facility]   CARDIAC CATHETERIZATION  07/2011   [facility]   CORONARY ANGIOPLASTY WITH STENT PLACEMENT      Home Medications:  Allergies as of 09/20/2023   No Known Allergies      Medication List        Accurate as of September 20, 2023  2:22 PM. If you have any questions, ask your nurse or doctor.          amLODipine 5 MG tablet Commonly known as: NORVASC Take 5 mg by mouth daily.   atorvastatin 40 MG tablet Commonly known as: LIPITOR   famotidine 20 MG tablet Commonly known as: PEPCID Take 1 tablet (20 mg total) by mouth 2 (two) times daily for 15 days.   ferrous sulfate 325 (65 FE) MG tablet Take 325 mg by mouth daily.   finasteride 5 MG tablet Commonly known as: PROSCAR TAKE 1 TABLET(5 MG) BY MOUTH DAILY   fish oil-omega-3 fatty acids 1000 MG capsule Take 1 g by mouth 2 (two) times daily.   FreeStyle Libre 2 Sensor Misc APPLY ONE SENSOR EVERY 14  DAYS FOR USE WITH READER TO MONITOR BLOOD SUGAR VALUES   glipiZIDE 5 MG 24 hr tablet Commonly known as: GLUCOTROL XL Take 5 mg by mouth daily before supper.   lisinopril 10 MG tablet Commonly known as: ZESTRIL Take 20 mg by mouth 2 (two) times daily before a meal.   Magnesium 250 MG Tabs Take 250 mg by mouth daily.   mirabegron ER 25 MG Tb24 tablet Commonly known as: Myrbetriq TAKE 1 TABLET(25 MG) BY MOUTH DAILY   Rivaroxaban 15 MG Tabs tablet Commonly known as: Xarelto Take 1 tablet (15 mg total) by mouth daily with supper.   Rybelsus 7 MG Tabs Generic drug: Semaglutide Take 1 tablet by mouth daily.   tamsulosin 0.4 MG Caps  capsule Commonly known as: FLOMAX Take 1 capsule (0.4 mg total) by mouth daily. TAKE 1 CAPSULE(0.4 MG) BY MOUTH DAILY   triamcinolone ointment 0.1 % Commonly known as: KENALOG Apply 1 Application topically 2 (two) times daily.        Allergies: No Known Allergies  Family History: Family History  Problem Relation Age of Onset   Dementia Mother    Heart attack Father    Kidney disease Neg Hx    Prostate cancer Neg Hx     Social History:  reports that he has been smoking cigarettes. He has a 25 pack-year smoking history. He has been exposed to tobacco smoke. He has never used smokeless tobacco. He reports that he does not drink alcohol and does not use drugs.  ROS: For pertinent review of systems please refer to history of present illness  Physical Exam: BP (!) 159/80   Pulse 78   Ht 5\' 3"  (1.6 m)   Wt 145 lb (65.8 kg)   BMI 25.69 kg/m   Constitutional:  Well nourished. Alert and oriented, No acute distress. HEENT:  AT, moist mucus membranes.  Trachea midline Cardiovascular: No clubbing, cyanosis, or edema. Respiratory: Normal respiratory effort, no increased work of breathing. Neurologic: Grossly intact, no focal deficits, moving all 4 extremities. Psychiatric: Normal mood and affect.    Laboratory Data: Urinalysis See EPIC and HPI I have reviewed the labs.  See HPI.     Pertinent Imaging:  09/20/23 14:19  Scan Result 37ml    Assessment & Plan:    1. Suspected UTI -UA grossly infected  -Urine culture pending -Started empirically on Ceftin 250 mg twice daily x 7 days , will adjust if necessary once urine culture and sensitivity results are available    2. BPH with LUTS -PVR < 300 cc -symptoms - frequency -continue conservative management, avoiding bladder irritants and timed voiding's -Continue tamsulosin 0.4 mg daily and finasteride 5 mg daily   3. Incontinence -worsened by UTI, will reassess when he returns in July  4.  Microscopic hematuria -UA  with micro heme, but likely due to infection -He will return in July will repeat UA to ensure hematuria clears with the treatment of infection No follow-ups on file.  Gary Lynn  [facility] [address] [place], [place] [address] [phone]

## 2023-09-24 LAB — CULTURE, URINE COMPREHENSIVE

## 2023-09-26 ENCOUNTER — Other Ambulatory Visit: Payer: Self-pay

## 2023-09-26 MED ORDER — CEFPODOXIME PROXETIL 100 MG PO TABS: 100.0000 mg | ORAL_TABLET | Freq: Two times a day (BID) | ORAL | 0 refills | Status: DC

## 2023-10-04 ENCOUNTER — Telehealth: Payer: Self-pay

## 2023-10-04 NOTE — Telephone Encounter (Signed)
 Pt's daughter calls triage line and states that pt's wife is concerned the patient may still have infection based on the patient getting up once last night to urinate. Advised daughter that getting up once at night is not uncommon for a patient of this age to get up once a night to void. Advised daughter that patient should complete antibiotics and then contact us  if true UTI symptoms develop. Daughter voiced understanding.

## 2023-11-17 ENCOUNTER — Other Ambulatory Visit: Payer: Self-pay | Admitting: Urology

## 2023-11-17 DIAGNOSIS — N401 Enlarged prostate with lower urinary tract symptoms: Secondary | ICD-10-CM

## 2023-11-22 NOTE — Progress Notes (Signed)
 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  3) Prognosis and future surveillance discussed. 4) Letter with treatment outcome sent to referring provider. 5) AK on the right ear-treated with LN2 at his request because of tenderness   Photodamage/future skin cancer risk: evidenced by rhytids, telangiectasias, and lentigines, and history of skin cancer 1) Currently stable, no concerning lesions on exam today. Actinic damage-sun protection measures discussed/advised.  2) Advise continued surveillance in addition to photoprotection.    INDICATION FOR MOHS SURGERY: location, size, histology, or recurrence  STAGE I: For the frontal scalp timeout performed at 10:55 a.m. Informed consent obtained. Anesthesia achieved with 0.33% lidocaine with 1:200,000 epinephrine. ChloraPrep applied. one section(s) excised using Mohs technique (this includes total peripheral and deep tissue margin excision and evaluation with frozen sections, excised and interpreted by the same physician).   Frozen section analysis revealed a negative margin.  Debulking showed atypical spindled cells in the dermis and within scar, no sign of deeper invasion, margin widely free.  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   Options for management of the wound were discussed. Given the location and size/depth of the wound, guiding sutures carried out using 3.0 Vicryl.  Final size: 2.1 cm    HISTORY OF PRESENT ILLNESS: Mr. Gary Lynn is seen in consultation at the request of Dr. Dela  for biopsy-proven AFX.   They note that the area has been present for about a few months  increasing in size with bumpiness .  There is a history of previous treatment.  Asks about an irritating/scaly bump on the right ear.  Reports no  other new or changing lesions and has no other complaints today.   REVIEW OF SYSTEMS: No fevers, chills, weight loss, lymphadenopathy or other skin concerns.  PHYSICAL EXAMINATION: GENERAL:  well-appearing in no acute distress, alert, appropriately oriented and interactive. SKIN: Examination of the relevant anatomic areas notable for a 1.5 cm nodule on the scalp. Scaly papule on the right ear.  There are rhytids, telangiectasias, and lentigines, consistent with photodamage.  Biopsy report(s) reviewed, confirming the diagnosis.   No LAD

## 2023-12-09 ENCOUNTER — Other Ambulatory Visit: Payer: Self-pay | Admitting: Urology

## 2023-12-09 DIAGNOSIS — N3281 Overactive bladder: Secondary | ICD-10-CM

## 2023-12-22 ENCOUNTER — Ambulatory Visit: Payer: Medicare HMO | Admitting: Urology

## 2023-12-28 NOTE — Progress Notes (Unsigned)
 12/29/2023 1:04 PM   Gary Lynn [birth year] [id number]  Referring provider: Weyman Bright, MD [address] [place],  [place] [address]  Urological history: 1. Urethral stricture - difficult foley placement with filiform's and followers in 2014 by Dr. Shiela for a PVR of 500 mL  2. BPH with LU TS - managed with finasteride  5 mg daily and tamsulosin  0.4 mg daily   3. Urge incontinence -managed with Myrbetriq  25 mg  No chief complaint on file.  HPI: Gary Lynn is a 89 y.o. male who presents today for follow up, ***  Previous records reviewed.     He presented to the office on September 20, 2023 with symptoms of  urinary urgency, urge incontinence, dysuria and voiding small amounts for one week.  Patient denied any modifying or aggravating factors.  Patient denied any recent UTI's, gross hematuria, dysuria or suprapubic/flank pain.  Patient denied any fevers, chills, nausea or vomiting.  UA yellow cloudy, specific gravity 1.025, 1+ heme, pH 7.0, 3+ protein, nitrate positive, 1+ leukocyte, greater than 30 WBCs, 11-30 RBCs, 0-10 epithelial cells, hyaline casts present, mucus threads present many bacteria.  Urine culture was positive for Enterobacter cloacae complex.  PVR 37 mL.  He was treated with 7 days of culture appropriate antibiotics.    UA ***     PMH: Past Medical History:  Diagnosis Date   Carotid artery stenosis    a. carotid doppler 05/2016: RICA 40-59%, LICA 1-39%   Chronic diastolic CHF (congestive heart failure) (HCC)    a. echo 05/2016: EF 55-60%, AK of the basal-midinferolateral and inferior myocardium (c/w prior study), GR1DD, aortic sclerosis without stenosis, mild MR, PASP 40 mmHg   Coronary artery disease    Cardiac cath in February of 2013: EF 60%, high-grade chronic disease in OM 1, occluded mid RCA at the site of a previously placed stent with left-to-right collaterals, occluded left SFA.   Diabetes mellitus with complication (HCC)     Hypercholesterolemia    Hypertension    PAF (paroxysmal atrial fibrillation) (HCC)    a. Zio monitor 05/2016: predominant rhyhtm of sinus with intermittent episodes of Afib/flutter with the longest episode being 6 hours and 51 minutes with an average heart rate of 114 bpm. Occasional PACs and PVCs. b. CHADS2VASc --> 6 (CHF, HTN, age x 2, DM, vascular disease)    Surgical History: Past Surgical History:  Procedure Laterality Date   BACK SURGERY     years ago   CARDIAC CATHETERIZATION  07/2007   [facility]   CARDIAC CATHETERIZATION  07/2011   [facility]   CORONARY ANGIOPLASTY WITH STENT PLACEMENT      Home Medications:  Allergies as of 12/29/2023   No Known Allergies      Medication List        Accurate as of December 28, 2023  1:04 PM. If you have any questions, ask your nurse or doctor.          amLODipine 5 MG tablet Commonly known as: NORVASC Take 5 mg by mouth daily.   atorvastatin  40 MG tablet Commonly known as: LIPITOR    cefpodoxime  100 MG tablet Commonly known as: VANTIN  Take 1 tablet (100 mg total) by mouth 2 (two) times daily.   famotidine  20 MG tablet Commonly known as: PEPCID  Take 1 tablet (20 mg total) by mouth 2 (two) times daily for 15 days.   ferrous sulfate  325 (65 FE) MG tablet Take 325 mg by mouth daily.   finasteride  5 MG tablet  Commonly known as: PROSCAR  Take 1 tablet by mouth once daily   fish oil-omega-3 fatty acids 1000 MG capsule Take 1 g by mouth 2 (two) times daily.   FreeStyle Libre 2 Sensor Misc APPLY ONE SENSOR EVERY 14 DAYS FOR USE WITH READER TO MONITOR BLOOD SUGAR VALUES   glipiZIDE 5 MG 24 hr tablet Commonly known as: GLUCOTROL XL Take 5 mg by mouth daily before supper.   lisinopril  10 MG tablet Commonly known as: ZESTRIL  Take 20 mg by mouth 2 (two) times daily before a meal.   Magnesium  250 MG Tabs Take 250 mg by mouth daily.   Myrbetriq  25 MG Tb24 tablet Generic drug: mirabegron  ER Take 1 tablet by mouth once daily    Rivaroxaban  15 MG Tabs tablet Commonly known as: Xarelto  Take 1 tablet (15 mg total) by mouth daily with supper.   Rybelsus 7 MG Tabs Generic drug: Semaglutide Take 1 tablet by mouth daily.   tamsulosin  0.4 MG Caps capsule Commonly known as: FLOMAX  Take 1 capsule (0.4 mg total) by mouth daily. TAKE 1 CAPSULE(0.4 MG) BY MOUTH DAILY   triamcinolone  ointment 0.1 % Commonly known as: KENALOG  Apply 1 Application topically 2 (two) times daily.        Allergies: No Known Allergies  Family History: Family History  Problem Relation Age of Onset   Dementia Mother    Heart attack Father    Kidney disease Neg Hx    Prostate cancer Neg Hx     Social History:  reports that he has been smoking cigarettes. He has a 25 pack-year smoking history. He has been exposed to tobacco smoke. He has never used smokeless tobacco. He reports that he does not drink alcohol and does not use drugs.  ROS: For pertinent review of systems please refer to history of present illness  Physical Exam: There were no vitals taken for this visit.  Constitutional:  Well nourished. Alert and oriented, No acute distress. HEENT: [place] AT, moist mucus membranes.  Trachea midline, no masses. Cardiovascular: No clubbing, cyanosis, or edema. Respiratory: Normal respiratory effort, no increased work of breathing. GI: Abdomen is soft, non tender, non distended, no abdominal masses. Liver and spleen not palpable.  No hernias appreciated.  Stool sample for occult testing is not indicated.   GU: No CVA tenderness.  No bladder fullness or masses.  Patient with circumcised/uncircumcised phallus. ***Foreskin easily retracted***  Urethral meatus is patent.  No penile discharge. No penile lesions or rashes. Scrotum without lesions, cysts, rashes and/or edema.  Testicles are located scrotally bilaterally. No masses are appreciated in the testicles. Left and right epididymis are normal. Rectal: Patient with  normal sphincter tone. Anus  and perineum without scarring or rashes. No rectal masses are appreciated. Prostate is approximately *** grams, *** nodules are appreciated. Seminal vesicles are normal. Skin: No rashes, bruises or suspicious lesions. Lymph: No cervical or inguinal adenopathy. Neurologic: Grossly intact, no focal deficits, moving all 4 extremities. Psychiatric: Normal mood and affect.   Laboratory Data: See EPIC and HPI I have reviewed the labs.  See HPI.     Pertinent Imaging: N/A  Assessment & Plan:    1. UTI  2. BPH with LUTS -continue conservative management, avoiding bladder irritants and timed voiding's -Continue tamsulosin  0.4 mg daily and finasteride  5 mg daily   3. Incontinence -  4.  Microscopic hematuria -UA ***   No follow-ups on file.  CLOTILDA HELON RIGGERS  [facility] [address] [place], [place] [address] [phone]

## 2023-12-29 ENCOUNTER — Encounter: Payer: Self-pay | Admitting: Urology

## 2023-12-29 ENCOUNTER — Ambulatory Visit (INDEPENDENT_AMBULATORY_CARE_PROVIDER_SITE_OTHER): Payer: Self-pay | Admitting: Urology

## 2023-12-29 VITALS — BP 161/68 | HR 80 | Ht 63.0 in | Wt 140.0 lb

## 2023-12-29 DIAGNOSIS — R3129 Other microscopic hematuria: Secondary | ICD-10-CM | POA: Diagnosis not present

## 2023-12-29 DIAGNOSIS — R3989 Other symptoms and signs involving the genitourinary system: Secondary | ICD-10-CM

## 2023-12-29 DIAGNOSIS — N401 Enlarged prostate with lower urinary tract symptoms: Secondary | ICD-10-CM

## 2023-12-29 DIAGNOSIS — N3941 Urge incontinence: Secondary | ICD-10-CM

## 2023-12-29 DIAGNOSIS — N138 Other obstructive and reflux uropathy: Secondary | ICD-10-CM

## 2023-12-29 DIAGNOSIS — R32 Unspecified urinary incontinence: Secondary | ICD-10-CM | POA: Diagnosis not present

## 2023-12-29 LAB — URINALYSIS, COMPLETE
Bilirubin, UA: NEGATIVE
Ketones, UA: NEGATIVE
Nitrite, UA: POSITIVE — AB
Specific Gravity, UA: 1.01 (ref 1.005–1.030)
Urobilinogen, Ur: 0.2 mg/dL (ref 0.2–1.0)
pH, UA: 6 (ref 5.0–7.5)

## 2023-12-29 LAB — MICROSCOPIC EXAMINATION: WBC, UA: >30 /HPF — AB (ref 0–5)

## 2023-12-29 MED ORDER — TAMSULOSIN HCL 0.4 MG PO CAPS: 0.4000 mg | ORAL_CAPSULE | Freq: Every day | ORAL | 3 refills | Status: DC

## 2023-12-29 MED ORDER — FINASTERIDE 5 MG PO TABS: 5.0000 mg | ORAL_TABLET | Freq: Every day | ORAL | 3 refills | Status: DC

## 2023-12-29 MED ORDER — MIRABEGRON ER 50 MG PO TB24
50.0000 mg | ORAL_TABLET | Freq: Every day | ORAL | 3 refills | Status: DC
Start: 2023-12-29 — End: 2024-02-09

## 2023-12-29 NOTE — Patient Instructions (Signed)
 Please call [phone] if you have not heard from scheduling in two weeks.

## 2024-01-03 ENCOUNTER — Ambulatory Visit: Payer: Self-pay | Admitting: Urology

## 2024-01-03 LAB — CULTURE, URINE COMPREHENSIVE

## 2024-01-06 ENCOUNTER — Ambulatory Visit
Admission: RE | Admit: 2024-01-06 | Discharge: 2024-01-06 | Disposition: A | Discharge status: Home / Self Care | Source: Ambulatory Visit | Attending: Urology | Admitting: Urology

## 2024-01-06 DIAGNOSIS — R3129 Other microscopic hematuria: Secondary | ICD-10-CM | POA: Diagnosis present

## 2024-01-20 ENCOUNTER — Encounter: Payer: Self-pay | Admitting: Internal Medicine

## 2024-02-07 NOTE — Progress Notes (Unsigned)
 02/09/2024 9:45 AM   Gary Lynn 1930-08-14 [id number]  Referring provider: Weyman Bright, MD [address] [place],  [place] [address]  Urological history: 1. Urethral stricture - difficult foley placement with filiform's and followers in 2014 by Dr. Shiela for a PVR of 500 mL  2. BPH with LU TS - managed with finasteride  5 mg daily and tamsulosin  0.4 mg daily   3. Urge incontinence -managed with Myrbetriq  25 mg  Chief Complaint  Patient presents with   Follow-up   HPI: Gary Lynn is a 89 y.o. male who presents today for follow up with his daughter, Gary Lynn.   Previous records reviewed.     At his last visit, we increased his Myrbetriq  to 50 mg daily, had him continue his tamsulosin  and finasteride  and ordered a RUS for his persistent micro heme.   RUS completed in June noted bilateral non obstructing renal calculi and layering debris in the dependent portion of the right side of his bladder.      He did not see any difference in his urinary symptoms by increasing his Myrbetriq  to 50 mg.  He still has daytime frequency/urgency.   Patient denies any modifying or aggravating factors.  Patient denies any recent UTI's, gross hematuria, dysuria or suprapubic/flank pain.  Patient denies any fevers, chills, nausea or vomiting.    Serum creatinine (12/2023) 1.23, eGFR 56  UA positive for pyuria and bacteriuria  PVR  22 mL   PMH: Past Medical History:  Diagnosis Date   Carotid artery stenosis    a. carotid doppler 05/2016: RICA 40-59%, LICA 1-39%   Chronic diastolic CHF (congestive heart failure) (HCC)    a. echo 05/2016: EF 55-60%, AK of the basal-midinferolateral and inferior myocardium (c/w prior study), GR1DD, aortic sclerosis without stenosis, mild MR, PASP 40 mmHg   Coronary artery disease    Cardiac cath in February of 2013: EF 60%, high-grade chronic disease in OM 1, occluded mid RCA at the site of a previously placed stent with left-to-right collaterals,  occluded left SFA.   Diabetes mellitus with complication (HCC)    Hypercholesterolemia    Hypertension    PAF (paroxysmal atrial fibrillation) (HCC)    a. Zio monitor 05/2016: predominant rhyhtm of sinus with intermittent episodes of Afib/flutter with the longest episode being 6 hours and 51 minutes with an average heart rate of 114 bpm. Occasional PACs and PVCs. b. CHADS2VASc --> 6 (CHF, HTN, age x 2, DM, vascular disease)    Surgical History: Past Surgical History:  Procedure Laterality Date   BACK SURGERY     years ago   CARDIAC CATHETERIZATION  07/2007   [facility]   CARDIAC CATHETERIZATION  07/2011   [facility]   CORONARY ANGIOPLASTY WITH STENT PLACEMENT      Home Medications:  Allergies as of 02/09/2024   No Known Allergies      Medication List        Accurate as of February 09, 2024  9:45 AM. If you have any questions, ask your nurse or doctor.          amLODipine 5 MG tablet Commonly known as: NORVASC Take 5 mg by mouth daily.   atorvastatin  40 MG tablet Commonly known as: LIPITOR    cefpodoxime  100 MG tablet Commonly known as: VANTIN  Take 1 tablet (100 mg total) by mouth 2 (two) times daily.   famotidine  20 MG tablet Commonly known as: PEPCID  Take 1 tablet (20 mg total) by mouth 2 (two) times daily for  15 days.   finasteride  5 MG tablet Commonly known as: PROSCAR  Take 1 tablet (5 mg total) by mouth daily.   fish oil-omega-3 fatty acids 1000 MG capsule Take 1 g by mouth 2 (two) times daily.   FreeStyle Libre 2 Sensor Misc APPLY ONE SENSOR EVERY 14 DAYS FOR USE WITH READER TO MONITOR BLOOD SUGAR VALUES   glipiZIDE 5 MG 24 hr tablet Commonly known as: GLUCOTROL XL Take 5 mg by mouth daily before supper.   lisinopril  10 MG tablet Commonly known as: ZESTRIL  Take 20 mg by mouth 2 (two) times daily before a meal.   Magnesium  250 MG Tabs Take 250 mg by mouth daily.   mirabegron  ER 50 MG Tb24 tablet Commonly known as: MYRBETRIQ  Take 1 tablet (50 mg total) by  mouth daily.   Rivaroxaban  15 MG Tabs tablet Commonly known as: Xarelto  Take 1 tablet (15 mg total) by mouth daily with supper.   Rybelsus 7 MG Tabs Generic drug: Semaglutide Take 1 tablet by mouth daily.   tamsulosin  0.4 MG Caps capsule Commonly known as: FLOMAX  Take 1 capsule (0.4 mg total) by mouth daily. TAKE 1 CAPSULE(0.4 MG) BY MOUTH DAILY   triamcinolone  ointment 0.1 % Commonly known as: KENALOG  Apply 1 Application topically 2 (two) times daily.        Allergies: No Known Allergies  Family History: Family History  Problem Relation Age of Onset   Dementia Mother    Heart attack Father    Kidney disease Neg Hx    Prostate cancer Neg Hx     Social History:  reports that he has been smoking cigarettes. He has a 25 pack-year smoking history. He has been exposed to tobacco smoke. He has never used smokeless tobacco. He reports that he does not drink alcohol and does not use drugs.  ROS: For pertinent review of systems please refer to history of present illness  Physical Exam: BP (!) 160/71   Pulse 79   Ht 5' 3 (1.6 m)   Wt 140 lb (63.5 kg)   BMI 24.80 kg/m   Constitutional:  Well nourished. Alert and oriented, No acute distress. HEENT: [place] AT, moist mucus membranes.  Trachea midline Cardiovascular: No clubbing, cyanosis, or edema. Respiratory: Normal respiratory effort, no increased work of breathing. Neurologic: Grossly intact, no focal deficits, moving all 4 extremities. Psychiatric: Normal mood and affect.   Laboratory Data: See EPIC and HPI I have reviewed the labs.  See HPI.     Pertinent Imaging:  02/09/24 09:11  Scan Result 22ml    Assessment & Plan:    1. UTI vs colonization - Explained there is some laying debris in his bladder on RUS - he does not want to undergo a cystoscopy   2. BPH with LUTS -continue conservative management, avoiding bladder irritants and timed voiding's -Continue tamsulosin  0.4 mg daily and finasteride  5 mg daily    3. OAB wet  - He saw no improvement with his urinary urgency and frequency when we increase the Myrbetriq  to 50 mg -We discussed that maybe some of his refractory symptoms may be due to recurrence of the stricture or possibly bladder cancer - At his age, he does not want to undergo another cystoscopy as he does not want to pursue any surgical intervention which is understandable - He will give a trial of Gemtesa  75 mg daily and his daughter will call us  if she finds it effective and she will also call us  if he changes his mind  about undergoing cystoscopy  4.  Microscopic hematuria -UA w/o micro heme - Will schedule renal ultrasound to evaluate upper tracts   Return if symptoms worsen or fail to improve.  CLOTILDA HELON RIGGERS  [facility] [address] [place], [place] [address] [phone]

## 2024-02-09 ENCOUNTER — Other Ambulatory Visit: Payer: Self-pay

## 2024-02-09 ENCOUNTER — Encounter: Payer: Self-pay | Admitting: Urology

## 2024-02-09 ENCOUNTER — Ambulatory Visit (INDEPENDENT_AMBULATORY_CARE_PROVIDER_SITE_OTHER): Admitting: Urology

## 2024-02-09 VITALS — BP 160/71 | HR 79 | Ht 63.0 in | Wt 140.0 lb

## 2024-02-09 DIAGNOSIS — R32 Unspecified urinary incontinence: Secondary | ICD-10-CM

## 2024-02-09 DIAGNOSIS — N138 Other obstructive and reflux uropathy: Secondary | ICD-10-CM | POA: Diagnosis not present

## 2024-02-09 DIAGNOSIS — N401 Enlarged prostate with lower urinary tract symptoms: Secondary | ICD-10-CM

## 2024-02-09 LAB — URINALYSIS, COMPLETE
Bilirubin, UA: NEGATIVE
Glucose, UA: NEGATIVE
Ketones, UA: NEGATIVE
Nitrite, UA: NEGATIVE
Specific Gravity, UA: 1.02 (ref 1.005–1.030)
Urobilinogen, Ur: 0.2 mg/dL (ref 0.2–1.0)
pH, UA: 7 (ref 5.0–7.5)

## 2024-02-09 LAB — MICROSCOPIC EXAMINATION: WBC, UA: >30 /HPF — AB (ref 0–5)

## 2024-02-09 LAB — BLADDER SCAN AMB NON-IMAGING

## 2024-02-09 MED ORDER — GEMTESA 75 MG PO TABS: 75.0000 mg | ORAL_TABLET | Freq: Every day | ORAL | Status: DC

## 2024-02-10 ENCOUNTER — Other Ambulatory Visit: Payer: Self-pay

## 2024-02-10 MED ORDER — TRIAMCINOLONE ACETONIDE 0.1 % EX CREA
1.0000 | TOPICAL_CREAM | Freq: Two times a day (BID) | CUTANEOUS | 1 refills | Status: DC
  Filled 2024-02-10: qty 160, 30d supply, fill #0

## 2024-02-21 ENCOUNTER — Telehealth: Payer: Self-pay | Admitting: Urology

## 2024-02-21 ENCOUNTER — Other Ambulatory Visit: Payer: Self-pay

## 2024-02-21 ENCOUNTER — Telehealth: Payer: Self-pay

## 2024-02-21 DIAGNOSIS — R32 Unspecified urinary incontinence: Secondary | ICD-10-CM

## 2024-02-21 MED ORDER — GEMTESA 75 MG PO TABS: 75.0000 mg | ORAL_TABLET | Freq: Every day | ORAL | 0 refills | Status: DC

## 2024-02-21 NOTE — Telephone Encounter (Signed)
 Pt tried samples of Gemtesa  and they are working good.  He would like a prescription sent to [facility] on [address].

## 2024-02-21 NOTE — Telephone Encounter (Signed)
 Patient daughter called stating that the patient is scheduled to see you in October they are moving over from Dr Jurline office and he needs a refill sent to [facility] [address] for his amlodipine  5 mg please send .

## 2024-02-22 ENCOUNTER — Other Ambulatory Visit: Payer: Self-pay | Admitting: Cardiology

## 2024-02-22 MED ORDER — AMLODIPINE BESYLATE 5 MG PO TABS: 5.0000 mg | ORAL_TABLET | Freq: Every day | ORAL | 0 refills | Status: DC

## 2024-03-06 ENCOUNTER — Other Ambulatory Visit: Payer: Self-pay | Admitting: Urology

## 2024-03-06 DIAGNOSIS — N3281 Overactive bladder: Secondary | ICD-10-CM

## 2024-03-20 ENCOUNTER — Other Ambulatory Visit: Payer: Self-pay | Admitting: Urology

## 2024-03-20 DIAGNOSIS — R32 Unspecified urinary incontinence: Secondary | ICD-10-CM

## 2024-03-29 ENCOUNTER — Ambulatory Visit (INDEPENDENT_AMBULATORY_CARE_PROVIDER_SITE_OTHER): Payer: Self-pay | Admitting: Cardiology

## 2024-03-29 ENCOUNTER — Encounter: Payer: Self-pay | Admitting: Cardiology

## 2024-03-29 VITALS — BP 130/69 | HR 76 | Ht 63.0 in | Wt 134.6 lb

## 2024-03-29 DIAGNOSIS — Z7689 Persons encountering health services in other specified circumstances: Secondary | ICD-10-CM | POA: Insufficient documentation

## 2024-03-29 DIAGNOSIS — I10 Essential (primary) hypertension: Secondary | ICD-10-CM

## 2024-03-29 DIAGNOSIS — I6523 Occlusion and stenosis of bilateral carotid arteries: Secondary | ICD-10-CM | POA: Diagnosis not present

## 2024-03-29 DIAGNOSIS — I951 Orthostatic hypotension: Secondary | ICD-10-CM | POA: Insufficient documentation

## 2024-03-29 MED ORDER — RIVAROXABAN 15 MG PO TABS: 15.0000 mg | ORAL_TABLET | Freq: Every day | ORAL | 1 refills | Status: DC

## 2024-03-29 MED ORDER — ATORVASTATIN CALCIUM 40 MG PO TABS: 40.0000 mg | ORAL_TABLET | Freq: Every day | ORAL | 1 refills | Status: DC

## 2024-03-29 NOTE — Progress Notes (Signed)
 New Patient Office Visit  Subjective   Patient ID: Gary Lynn, male    DOB: May 05, 1931  Age: 89 y.o. MRN: [id number]  CC:  Chief Complaint  Patient presents with   Establish Care    Light headed and struggling with memory.     HPI Gary Lynn presents to establish care Previous Primary Care provider/office:   he does have additional concerns to discuss today.   Patient in office to establish care. Patient complains of orthostatic hypotension, mostly during the night when he gets up to use the restroom. Discuss sitting on the side of the bed, standing slowly, waiting before walking. Carotid ultrasound 2019 RICA 40-59%, LICA 1-39%. Patient declined surgery at that time. Discussed ordering an ultrasound, patient and spouse decline at this time.  Continue same medications.     Outpatient Encounter Medications as of 03/29/2024  Medication Sig   amLODipine  (NORVASC ) 5 MG tablet Take 1 tablet (5 mg total) by mouth daily.   Continuous Glucose Sensor (FREESTYLE LIBRE 2 SENSOR) MISC APPLY ONE SENSOR EVERY 14 DAYS FOR USE WITH READER TO MONITOR BLOOD SUGAR VALUES   finasteride  (PROSCAR ) 5 MG tablet Take 1 tablet (5 mg total) by mouth daily.   fish oil-omega-3 fatty acids 1000 MG capsule Take 1 g by mouth 2 (two) times daily.    GEMTESA  75 MG TABS Take 1 tablet by mouth once daily   glipiZIDE (GLUCOTROL XL) 5 MG 24 hr tablet Take 5 mg by mouth daily before supper.    lisinopril  (PRINIVIL ,ZESTRIL ) 10 MG tablet Take 20 mg by mouth 2 (two) times daily before a meal.    Magnesium  250 MG TABS Take 250 mg by mouth daily.   RYBELSUS 7 MG TABS Take 1 tablet by mouth daily.   tamsulosin  (FLOMAX ) 0.4 MG CAPS capsule Take 1 capsule (0.4 mg total) by mouth daily. TAKE 1 CAPSULE(0.4 MG) BY MOUTH DAILY   triamcinolone  0.1%-Eucerin equivalent 1:1 cream mixture Apply 1 Application topically 2 (two) times daily.   triamcinolone  ointment (KENALOG ) 0.1 % Apply 1 Application topically 2 (two) times  daily.   [DISCONTINUED] atorvastatin  (LIPITOR ) 40 MG tablet    [DISCONTINUED] Rivaroxaban  (XARELTO ) 15 MG TABS tablet Take 1 tablet (15 mg total) by mouth daily with supper.   atorvastatin  (LIPITOR ) 40 MG tablet Take 1 tablet (40 mg total) by mouth daily.   cefpodoxime  (VANTIN ) 100 MG tablet Take 1 tablet (100 mg total) by mouth 2 (two) times daily. (Patient not taking: Reported on 03/29/2024)   Rivaroxaban  (XARELTO ) 15 MG TABS tablet Take 1 tablet (15 mg total) by mouth daily with supper.   [DISCONTINUED] famotidine  (PEPCID ) 20 MG tablet Take 1 tablet (20 mg total) by mouth 2 (two) times daily for 15 days. (Patient not taking: Reported on 03/29/2024)   No facility-administered encounter medications on file as of 03/29/2024.    Past Medical History:  Diagnosis Date   Carotid artery stenosis    a. carotid doppler 05/2016: RICA 40-59%, LICA 1-39%   Chronic diastolic CHF (congestive heart failure) (HCC)    a. echo 05/2016: EF 55-60%, AK of the basal-midinferolateral and inferior myocardium (c/w prior study), GR1DD, aortic sclerosis without stenosis, mild MR, PASP 40 mmHg   Coronary artery disease    Cardiac cath in February of 2013: EF 60%, high-grade chronic disease in OM 1, occluded mid RCA at the site of a previously placed stent with left-to-right collaterals, occluded left SFA.   Diabetes mellitus with complication (HCC)  Hypercholesterolemia    Hypertension    PAF (paroxysmal atrial fibrillation) (HCC)    a. Zio monitor 05/2016: predominant rhyhtm of sinus with intermittent episodes of Afib/flutter with the longest episode being 6 hours and 51 minutes with an average heart rate of 114 bpm. Occasional PACs and PVCs. b. CHADS2VASc --> 6 (CHF, HTN, age x 2, DM, vascular disease)    Past Surgical History:  Procedure Laterality Date   BACK SURGERY     years ago   CARDIAC CATHETERIZATION  07/2007   [facility]   CARDIAC CATHETERIZATION  07/2011   [facility]   CORONARY ANGIOPLASTY WITH STENT PLACEMENT       Family History  Problem Relation Age of Onset   Dementia Mother    Heart attack Father    Kidney disease Neg Hx    Prostate cancer Neg Hx     Social History   Socioeconomic History   Marital status: Married    Spouse name: Not on file   Number of children: Not on file   Years of education: Not on file   Highest education level: Not on file  Occupational History   Not on file  Tobacco Use   Smoking status: Every Day    Current packs/day: 0.50    Average packs/day: 0.5 packs/day for 50.0 years (25.0 ttl pk-yrs)    Types: Cigarettes    Passive exposure: Current   Smokeless tobacco: Never   Tobacco comments:    1 pack per 3days  Vaping Use   Vaping status: Never Used  Substance and Sexual Activity   Alcohol use: No    Alcohol/week: 0.0 standard drinks of alcohol   Drug use: No   Sexual activity: Not on file  Other Topics Concern   Not on file  Social History Narrative   Not on file   Social Drivers of Health   Financial Resource Strain: Not on file  Food Insecurity: Not on file  Transportation Needs: Not on file  Physical Activity: Not on file  Stress: Not on file  Social Connections: Not on file  Intimate Partner Violence: Not on file    Review of Systems  Constitutional: Negative.   HENT: Negative.    Eyes: Negative.   Respiratory: Negative.  Negative for shortness of breath.   Cardiovascular: Negative.  Negative for chest pain.  Gastrointestinal: Negative.  Negative for abdominal pain, constipation and diarrhea.  Genitourinary: Negative.   Musculoskeletal:  Negative for joint pain and myalgias.  Skin: Negative.   Neurological: Negative.  Negative for dizziness and headaches.  Endo/Heme/Allergies: Negative.   All other systems reviewed and are negative.       Objective   BP 130/69   Pulse 76   Ht 5' 3 (1.6 m)   Wt 134 lb 9.6 oz (61.1 kg)   SpO2 98%   BMI 23.84 kg/m   Physical Exam Nursing note reviewed.  Constitutional:       Appearance: Normal appearance. He is normal weight.  HENT:     Head: Normocephalic and atraumatic.     Nose: Nose normal.     Mouth/Throat:     Mouth: Mucous membranes are moist.     Pharynx: Oropharynx is clear.  Eyes:     Extraocular Movements: Extraocular movements intact.     Conjunctiva/sclera: Conjunctivae normal.     Pupils: Pupils are equal, round, and reactive to light.  Cardiovascular:     Rate and Rhythm: Normal rate and regular rhythm.  Pulses: Normal pulses.     Heart sounds: Normal heart sounds.  Pulmonary:     Effort: Pulmonary effort is normal.     Breath sounds: Normal breath sounds.  Abdominal:     General: Abdomen is flat. Bowel sounds are normal.     Palpations: Abdomen is soft.  Musculoskeletal:        General: Normal range of motion.     Cervical back: Normal range of motion.  Skin:    General: Skin is warm and dry.  Neurological:     General: No focal deficit present.     Mental Status: He is alert and oriented to person, place, and time.  Psychiatric:        Mood and Affect: Mood normal.        Behavior: Behavior normal.        Thought Content: Thought content normal.        Judgment: Judgment normal.        Assessment & Plan:  Continue same medications.   Problem List Items Addressed This Visit       Cardiovascular and Mediastinum   Carotid artery stenosis   Relevant Medications   atorvastatin  (LIPITOR ) 40 MG tablet   Rivaroxaban  (XARELTO ) 15 MG TABS tablet   Essential hypertension - Primary   Relevant Medications   atorvastatin  (LIPITOR ) 40 MG tablet   Rivaroxaban  (XARELTO ) 15 MG TABS tablet   Orthostatic hypotension   Relevant Medications   atorvastatin  (LIPITOR ) 40 MG tablet   Rivaroxaban  (XARELTO ) 15 MG TABS tablet     Other   Encounter to establish care    Return in about 6 months (around 09/27/2024).   Total time spent: 25 minutes  [employer], NP  03/29/2024   This document may have been prepared by Dragon  Voice Recognition software and as such may include unintentional dictation errors.

## 2024-04-02 ENCOUNTER — Ambulatory Visit
Admission: RE | Admit: 2024-04-02 | Discharge: 2024-04-02 | Disposition: A | Discharge status: Home / Self Care | Source: Ambulatory Visit | Attending: Radiation Oncology | Admitting: Radiation Oncology

## 2024-04-02 ENCOUNTER — Other Ambulatory Visit

## 2024-04-02 ENCOUNTER — Encounter: Payer: Self-pay | Admitting: Oncology

## 2024-04-02 ENCOUNTER — Inpatient Hospital Stay: Attending: Oncology | Admitting: Oncology

## 2024-04-02 VITALS — BP 150/80 | HR 74 | Temp 98.4°F | Resp 18 | Ht 63.0 in | Wt 136.0 lb

## 2024-04-02 DIAGNOSIS — D044 Carcinoma in situ of skin of scalp and neck: Secondary | ICD-10-CM

## 2024-04-02 DIAGNOSIS — C4440 Unspecified malignant neoplasm of skin of scalp and neck: Secondary | ICD-10-CM | POA: Insufficient documentation

## 2024-04-02 DIAGNOSIS — C4490 Unspecified malignant neoplasm of skin, unspecified: Secondary | ICD-10-CM

## 2024-04-02 NOTE — Progress Notes (Unsigned)
 Patient has been having lightlessness. Patient was told that he had cancer and needed surgery and that the cancer is aggressive. He says that the area does tingle every now and then.

## 2024-04-02 NOTE — Progress Notes (Unsigned)
 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  INTERVAL HISTORY: Patient is a 89 year old male who is noted to have persistent lesion on the top of his head despite local treatments.  Subsequent biopsy revealed the above-stated malignancy.  He otherwise feels well and is asymptomatic.  He has no neurologic complaints.  He denies any recent fevers or illnesses.  He has a good appetite and denies weight loss.  He has no chest pain, shortness of breath, cough, or hemoptysis.  He denies any nausea, vomiting, constipation, or diarrhea.  He has no urinary complaints.  Patient offers no further specific complaints today.  REVIEW OF SYSTEMS:   Review of Systems  Constitutional: Negative.  Negative for fever, malaise/fatigue and weight loss.  Respiratory: Negative.  Negative for cough, hemoptysis and shortness of breath.   Cardiovascular: Negative.  Negative for chest pain and leg swelling.  Gastrointestinal: Negative.  Negative for abdominal pain.  Genitourinary: Negative.  Negative for dysuria.  Musculoskeletal: Negative.  Negative for back pain.  Skin: Negative.  Negative for rash.  Neurological: Negative.  Negative for dizziness, focal weakness, weakness and headaches.  Psychiatric/Behavioral: Negative.  The patient is not nervous/anxious.     As per HPI. Otherwise, a complete review of systems is negative.  PAST MEDICAL HISTORY: Past Medical History:  Diagnosis Date   Carotid artery stenosis    a. carotid doppler 05/2016: RICA 40-59%, LICA 1-39%   Chronic diastolic CHF (congestive heart failure) (HCC)    a. echo 05/2016: EF 55-60%, AK of the basal-midinferolateral and inferior myocardium (c/w prior study),  GR1DD, aortic sclerosis without stenosis, mild MR, PASP 40 mmHg   Coronary artery disease    Cardiac cath in February of 2013: EF 60%, high-grade chronic disease in OM 1, occluded mid RCA at the site of a previously placed stent with left-to-right collaterals, occluded left SFA.   Diabetes mellitus with complication (HCC)    Hypercholesterolemia    Hypertension    PAF (paroxysmal atrial fibrillation) (HCC)    a. Zio monitor 05/2016: predominant rhyhtm of sinus with intermittent episodes of Afib/flutter with the longest episode being 6 hours and 51 minutes with an average heart rate of 114 bpm. Occasional PACs and PVCs. b. CHADS2VASc --> 6 (CHF, HTN, age x 2, DM, vascular disease)    PAST SURGICAL HISTORY: Past Surgical History:  Procedure Laterality Date   BACK SURGERY     years ago   CARDIAC CATHETERIZATION  07/2007   [facility]   CARDIAC CATHETERIZATION  07/2011   [facility]   CORONARY ANGIOPLASTY WITH STENT PLACEMENT      FAMILY HISTORY: Family History  Problem Relation Age of Onset   Dementia Mother    Heart attack Father    Kidney disease Neg Hx    Prostate cancer Neg Hx     ADVANCED DIRECTIVES (Y/N):  N  HEALTH MAINTENANCE: Social History   Tobacco Use   Smoking status: Every Day    Current packs/day: 0.50    Average packs/day: 0.5 packs/day for 50.0 years (25.0 ttl pk-yrs)    Types: Cigarettes    Passive exposure: Current   Smokeless tobacco: Never   Tobacco comments:    1 pack per 3days  Vaping Use   Vaping status: Never  Used  Substance Use Topics   Alcohol use: No    Alcohol/week: 0.0 standard drinks of alcohol   Drug use: No     Colonoscopy:  PAP:  Bone density:  Lipid panel:  No Known Allergies  Current Outpatient Medications  Medication Sig Dispense Refill   amLODipine  (NORVASC ) 5 MG tablet Take 1 tablet (5 mg total) by mouth daily. 90 tablet 0   atorvastatin  (LIPITOR ) 40 MG tablet Take 1 tablet (40 mg total) by mouth daily. 90 tablet 1   Continuous  Glucose Sensor (FREESTYLE LIBRE 2 SENSOR) MISC APPLY ONE SENSOR EVERY 14 DAYS FOR USE WITH READER TO MONITOR BLOOD SUGAR VALUES     finasteride  (PROSCAR ) 5 MG tablet Take 1 tablet (5 mg total) by mouth daily. 90 tablet 3   fish oil-omega-3 fatty acids 1000 MG capsule Take 1 g by mouth 2 (two) times daily.      GEMTESA  75 MG TABS Take 1 tablet by mouth once daily 30 tablet 0   glipiZIDE (GLUCOTROL XL) 5 MG 24 hr tablet Take 5 mg by mouth daily before supper.      lisinopril  (PRINIVIL ,ZESTRIL ) 10 MG tablet Take 20 mg by mouth 2 (two) times daily before a meal.      Magnesium  250 MG TABS Take 250 mg by mouth daily.     Rivaroxaban  (XARELTO ) 15 MG TABS tablet Take 1 tablet (15 mg total) by mouth daily with supper. 90 tablet 1   RYBELSUS 7 MG TABS Take 1 tablet by mouth daily.     tamsulosin  (FLOMAX ) 0.4 MG CAPS capsule Take 1 capsule (0.4 mg total) by mouth daily. TAKE 1 CAPSULE(0.4 MG) BY MOUTH DAILY 90 capsule 3   triamcinolone  0.1%-Eucerin equivalent 1:1 cream mixture Apply 1 Application topically 2 (two) times daily. 160 g 1   triamcinolone  ointment (KENALOG ) 0.1 % Apply 1 Application topically 2 (two) times daily. 80 g 1   cefpodoxime  (VANTIN ) 100 MG tablet Take 1 tablet (100 mg total) by mouth 2 (two) times daily. (Patient not taking: Reported on 04/02/2024) 14 tablet 0   No current facility-administered medications for this visit.    OBJECTIVE: Vitals:   04/02/24 1332 04/02/24 1335  BP: (!) 151/68 (!) 150/80  Pulse: 74   Resp: 18   Temp: 98.4 F (36.9 C)   SpO2: 100%      Body mass index is 24.09 kg/m.    ECOG FS:0 - Asymptomatic  General: Well-developed, well-nourished, no acute distress. Eyes: Pink conjunctiva, anicteric sclera. HEENT: Normocephalic, moist mucous membranes. Lungs: No audible wheezing or coughing. Heart: Regular rate and rhythm. Abdomen: Soft, nontender, no obvious distention. Musculoskeletal: No edema, cyanosis, or clubbing. Neuro: Alert, answering all  questions appropriately. Cranial nerves grossly intact. Skin: 2 to 3 cm lesion scalp noted.   Psych: Normal affect. Lymphatics: No cervical, calvicular, axillary or inguinal LAD.   LAB RESULTS:  Lab Results  Component Value Date   NA 136 08/19/2023   K 4.4 08/19/2023   CL 100 08/19/2023   CO2 27 08/19/2023   GLUCOSE 135 (H) 08/19/2023   BUN 22 08/19/2023   CREATININE 1.18 08/19/2023   CALCIUM  9.0 08/19/2023   PROT 6.5 12/18/2017   ALBUMIN 3.1 (L) 12/18/2017   AST 14 (L) 12/18/2017   ALT 10 12/18/2017   ALKPHOS 54 12/18/2017   BILITOT 0.6 12/18/2017   GFRNONAA 59 (L) 08/19/2023   GFRAA 77 03/14/2018    Lab Results  Component Value Date   WBC 9.0 08/19/2023  NEUTROABS 5.2 03/14/2018   HGB 13.2 08/19/2023   HCT 40.2 08/19/2023   MCV 91.6 08/19/2023   PLT 180 08/19/2023     STUDIES: No results found.  ASSESSMENT: Pleomorphic dermal sarcoma of scalp.  PLAN:    Pleomorphic dermal sarcoma of scalp: By report not amenable to local treatments.  Unclear if systemic treatment would be beneficial at this time.  Patient has consultation with radiation oncology later today for consideration of local control of disease with XRT.  Will get a PET scan for completeness to ensure there is no systemic disease.  No further intervention is needed at this time.  Will arrange follow-up with inclusion of XRT.  I spent a total of 60 minutes reviewing chart data, face-to-face evaluation with the patient, counseling and coordination of care as detailed above.   Patient expressed understanding and was in agreement with this plan. He also understands that He can call clinic at any time with any questions, concerns, or complaints.    Cancer Staging  No matching staging information was found for the patient.   Evalene JINNY Reusing, MD   04/03/2024 6:54 AM

## 2024-04-02 NOTE — Consult Note (Signed)
 NEW PATIENT EVALUATION  Name: Gary Lynn  MRN: [id number]  Date:   04/02/2024     DOB: 21-Oct-1930   This 89 y.o. 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   PREVIOUS INVESTIGATIONS:  PET CT scan ordered Pathology results from [facility] reviewed Clinical notes reviewed  HPI: Patient is a 89 year old male with a history of Mohs chemosurgery back in June for atypical fibroxanthoma.  He has had recurrence with marked thickening and nodularity of the scalp in the area of the prior surgery.  He is fairly asymptomatic.  He is seeing Dr. Jacobo who is ordered a PET CT scan.  He is seen today for radiation oncology opinion.  PLANNED TREATMENT REGIMEN: Electron-beam therapy  PAST MEDICAL HISTORY:  has a past medical history of Carotid artery stenosis, Chronic diastolic CHF (congestive heart failure) (HCC), Coronary artery disease, Diabetes mellitus with complication (HCC), Hypercholesterolemia, Hypertension, and PAF (paroxysmal atrial fibrillation) (HCC).    PAST SURGICAL HISTORY:  Past Surgical History:  Procedure Laterality Date   BACK SURGERY     years ago   CARDIAC CATHETERIZATION  07/2007   [facility]   CARDIAC CATHETERIZATION  07/2011   [facility]   CORONARY ANGIOPLASTY WITH STENT PLACEMENT      FAMILY HISTORY: family history includes Dementia in his mother; Heart attack in his father.  SOCIAL HISTORY:  reports that he has been smoking cigarettes. He has a 25 pack-year smoking history. He has been exposed to tobacco smoke. He has never used smokeless tobacco. He reports that he does not drink alcohol and does not use drugs.  ALLERGIES: Patient has no known allergies.  MEDICATIONS:  Current  Outpatient Medications  Medication Sig Dispense Refill   amLODipine  (NORVASC ) 5 MG tablet Take 1 tablet (5 mg total) by mouth daily. 90 tablet 0   atorvastatin  (LIPITOR ) 40 MG tablet Take 1 tablet (40 mg total) by mouth daily. 90 tablet 1   cefpodoxime  (VANTIN ) 100 MG tablet Take 1 tablet (100 mg total) by mouth 2 (two) times daily. (Patient not taking: Reported on 04/02/2024) 14 tablet 0   Continuous Glucose Sensor (FREESTYLE LIBRE 2 SENSOR) MISC APPLY ONE SENSOR EVERY 14 DAYS FOR USE WITH READER TO MONITOR BLOOD SUGAR VALUES     finasteride  (PROSCAR ) 5 MG tablet Take 1 tablet (5 mg total) by mouth daily. 90 tablet 3   fish oil-omega-3 fatty acids 1000 MG capsule Take 1 g by mouth 2 (two) times daily.      GEMTESA  75 MG TABS Take 1 tablet by mouth once daily 30 tablet 0   glipiZIDE (GLUCOTROL XL) 5 MG 24 hr tablet Take 5 mg by mouth daily before supper.      lisinopril  (PRINIVIL ,ZESTRIL ) 10 MG tablet Take 20 mg by mouth 2 (two) times daily before a meal.      Magnesium  250 MG TABS Take 250 mg by mouth daily.     Rivaroxaban  (XARELTO ) 15 MG TABS tablet Take 1 tablet (15 mg total) by mouth daily with supper. 90 tablet 1   RYBELSUS 7 MG TABS Take 1 tablet by mouth daily.     tamsulosin  (FLOMAX ) 0.4 MG CAPS capsule Take 1 capsule (0.4 mg total) by mouth daily.  TAKE 1 CAPSULE(0.4 MG) BY MOUTH DAILY 90 capsule 3   triamcinolone  0.1%-Eucerin equivalent 1:1 cream mixture Apply 1 Application topically 2 (two) times daily. 160 g 1   triamcinolone  ointment (KENALOG ) 0.1 % Apply 1 Application topically 2 (two) times daily. 80 g 1   No current facility-administered medications for this encounter.    ECOG PERFORMANCE STATUS:  0 - Asymptomatic  REVIEW OF SYSTEMS: Patient denies any weight loss, fatigue, weakness, fever, chills or night sweats. Patient denies any loss of vision, blurred vision. Patient denies any ringing  of the ears or hearing loss. No irregular heartbeat. Patient denies heart murmur or  history of fainting. Patient denies any chest pain or pain radiating to her upper extremities. Patient denies any shortness of breath, difficulty breathing at night, cough or hemoptysis. Patient denies any swelling in the lower legs. Patient denies any nausea vomiting, vomiting of blood, or coffee ground material in the vomitus. Patient denies any stomach pain. Patient states has had normal bowel movements no significant constipation or diarrhea. Patient denies any dysuria, hematuria or significant nocturia. Patient denies any problems walking, swelling in the joints or loss of balance. Patient denies any skin changes, loss of hair or loss of weight. Patient denies any excessive worrying or anxiety or significant depression. Patient denies any problems with insomnia. Patient denies excessive thirst, polyuria, polydipsia. Patient denies any swollen glands, patient denies easy bruising or easy bleeding. Patient denies any recent infections, allergies or URI. Patient s visual fields have not changed significantly in recent time.   PHYSICAL EXAM: There were no vitals taken for this visit. Elderly wheelchair-bound male in NAD slightly frail.  Area of nodularity on the vertex of the scalp and area of prior Mohs chemosurgery.  No evidence of adenopathy in the submandibular or cervical chain.  Well-developed well-nourished patient in NAD. HEENT reveals PERLA, EOMI, discs not visualized.  Oral cavity is clear. No oral mucosal lesions are identified. Neck is clear without evidence of cervical or supraclavicular adenopathy. Lungs are clear to A&P. Cardiac examination is essentially unremarkable with regular rate and rhythm without murmur rub or thrill. Abdomen is benign with no organomegaly or masses noted. Motor sensory and DTR levels are equal and symmetric in the upper and lower extremities. Cranial nerves II through XII are grossly intact. Proprioception is intact. No peripheral adenopathy or edema is identified. No  motor or sensory levels are noted. Crude visual fields are within normal range.  LABORATORY DATA: Pathology reports reviewed    RADIOLOGY RESULTS: PET CT scan has been ordered   IMPRESSION: Atypical fibroxanthoma of the scalp vertex and patient status post prior Mohs chemosurgery in 89 year old male  PLAN: At this time I have recommended electron-beam therapy.  Typically these tumors take 60 Gray in over 6 weeks.  My hope is to prevent further progression and possible eradication of the disease at this time.  Risks and benefits of treatment occluding skin reaction fatigue all reviewed with the patient and his daughter.  Patient has had a PET CT scan ordered I will review that prior to initiating treatment.  I have set up CT simulation for the end of next week.  Patient and daughter both comprehend my recommendations well.  I would like to take this opportunity to thank you for allowing me to participate in the care of your patient.SABRA Marcey Penton, MD

## 2024-04-03 ENCOUNTER — Telehealth: Payer: Self-pay | Admitting: *Deleted

## 2024-04-03 NOTE — Telephone Encounter (Signed)
 The daughter wants to know how much money that they will be having to pay she already has talk to the insurance and the only thing they have to do is have 20% of insurance and she says that his insurance is health [employer].  She has already done all the things that we tell pt's.  She states that did not tell her what amount that is.  She would like somebody to investigate that and let her know.  Daughter does not want to have more stress on him with all the money that he might have to pay so she is trying to help him as well as if if it is a lot of money start looking for help from the hospital or other things that she can tap in if he qualifies.

## 2024-04-10 ENCOUNTER — Ambulatory Visit
Admission: RE | Admit: 2024-04-10 | Discharge: 2024-04-10 | Disposition: A | Discharge status: Home / Self Care | Source: Ambulatory Visit | Attending: Oncology | Admitting: Oncology

## 2024-04-10 DIAGNOSIS — C4490 Unspecified malignant neoplasm of skin, unspecified: Secondary | ICD-10-CM

## 2024-04-10 DIAGNOSIS — I700 Atherosclerosis of aorta: Secondary | ICD-10-CM | POA: Insufficient documentation

## 2024-04-10 DIAGNOSIS — R918 Other nonspecific abnormal finding of lung field: Secondary | ICD-10-CM | POA: Insufficient documentation

## 2024-04-10 DIAGNOSIS — C4449 Other specified malignant neoplasm of skin of scalp and neck: Secondary | ICD-10-CM | POA: Diagnosis not present

## 2024-04-10 DIAGNOSIS — J439 Emphysema, unspecified: Secondary | ICD-10-CM | POA: Insufficient documentation

## 2024-04-10 LAB — GLUCOSE, CAPILLARY: Glucose-Capillary: 148 mg/dL — ABNORMAL HIGH (ref 70–99)

## 2024-04-10 MED ORDER — FLUDEOXYGLUCOSE F - 18 (FDG) INJECTION
7.0000 | Freq: Once | INTRAVENOUS | Status: AC | PRN
  Administered 2024-04-10: 7.64 via INTRAVENOUS

## 2024-04-12 ENCOUNTER — Ambulatory Visit
Admission: RE | Admit: 2024-04-12 | Discharge: 2024-04-12 | Disposition: A | Discharge status: Home / Self Care | Source: Ambulatory Visit | Attending: Radiation Oncology | Admitting: Radiation Oncology

## 2024-04-12 DIAGNOSIS — C490 Malignant neoplasm of connective and soft tissue of head, face and neck: Secondary | ICD-10-CM | POA: Diagnosis present

## 2024-04-16 ENCOUNTER — Other Ambulatory Visit: Payer: Self-pay | Admitting: Urology

## 2024-04-16 DIAGNOSIS — R32 Unspecified urinary incontinence: Secondary | ICD-10-CM

## 2024-04-17 DIAGNOSIS — C490 Malignant neoplasm of connective and soft tissue of head, face and neck: Secondary | ICD-10-CM | POA: Diagnosis not present

## 2024-04-23 ENCOUNTER — Other Ambulatory Visit: Payer: Self-pay

## 2024-04-23 ENCOUNTER — Ambulatory Visit
Admission: RE | Admit: 2024-04-23 | Discharge: 2024-04-23 | Disposition: A | Discharge status: Home / Self Care | Source: Ambulatory Visit | Attending: Radiation Oncology | Admitting: Radiation Oncology

## 2024-04-23 DIAGNOSIS — C490 Malignant neoplasm of connective and soft tissue of head, face and neck: Secondary | ICD-10-CM | POA: Diagnosis present

## 2024-04-23 LAB — RAD ONC ARIA SESSION SUMMARY
Course Elapsed Days: 0
Plan Fractions Treated to Date: 1
Plan Prescribed Dose Per Fraction: 2 Gy
Plan Total Fractions Prescribed: 30
Plan Total Prescribed Dose: 60 Gy
Reference Point Dosage Given to Date: 2 Gy
Reference Point Session Dosage Given: 2 Gy
Session Number: 1

## 2024-04-24 ENCOUNTER — Ambulatory Visit
Admission: RE | Admit: 2024-04-24 | Discharge: 2024-04-24 | Disposition: A | Discharge status: Home / Self Care | Source: Ambulatory Visit | Attending: Radiation Oncology | Admitting: Radiation Oncology

## 2024-04-24 ENCOUNTER — Other Ambulatory Visit: Payer: Self-pay

## 2024-04-24 DIAGNOSIS — C490 Malignant neoplasm of connective and soft tissue of head, face and neck: Secondary | ICD-10-CM | POA: Diagnosis not present

## 2024-04-24 LAB — RAD ONC ARIA SESSION SUMMARY
Course Elapsed Days: 1
Plan Fractions Treated to Date: 2
Plan Prescribed Dose Per Fraction: 2 Gy
Plan Total Fractions Prescribed: 30
Plan Total Prescribed Dose: 60 Gy
Reference Point Dosage Given to Date: 4 Gy
Reference Point Session Dosage Given: 2 Gy
Session Number: 2

## 2024-04-25 ENCOUNTER — Ambulatory Visit
Admission: RE | Admit: 2024-04-25 | Discharge: 2024-04-25 | Disposition: A | Discharge status: Home / Self Care | Source: Ambulatory Visit | Attending: Radiation Oncology | Admitting: Radiation Oncology

## 2024-04-25 ENCOUNTER — Inpatient Hospital Stay: Attending: Oncology | Admitting: Hospice and Palliative Medicine

## 2024-04-25 ENCOUNTER — Other Ambulatory Visit: Payer: Self-pay

## 2024-04-25 DIAGNOSIS — C490 Malignant neoplasm of connective and soft tissue of head, face and neck: Secondary | ICD-10-CM | POA: Diagnosis not present

## 2024-04-25 DIAGNOSIS — C4490 Unspecified malignant neoplasm of skin, unspecified: Secondary | ICD-10-CM

## 2024-04-25 LAB — RAD ONC ARIA SESSION SUMMARY
Course Elapsed Days: 2
Plan Fractions Treated to Date: 3
Plan Prescribed Dose Per Fraction: 2 Gy
Plan Total Fractions Prescribed: 30
Plan Total Prescribed Dose: 60 Gy
Reference Point Dosage Given to Date: 6 Gy
Reference Point Session Dosage Given: 2 Gy
Session Number: 3

## 2024-04-25 NOTE — Progress Notes (Signed)
 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 The MOC considered available treatment options and made the following recommendations/referrals:  SW  The MOC is a meeting of clinicians from various specialty areas who evaluate and discuss patients for whom a multidisciplinary approach is being considered. Final determinations in the plan of care are those of the provider(s).   Today's extended care, comprehensive team conference, Gary Lynn was not present for the discussion and was not examined.

## 2024-04-26 ENCOUNTER — Telehealth: Payer: Self-pay

## 2024-04-26 ENCOUNTER — Ambulatory Visit
Admission: RE | Admit: 2024-04-26 | Discharge: 2024-04-26 | Disposition: A | Discharge status: Home / Self Care | Source: Ambulatory Visit | Attending: Radiation Oncology | Admitting: Radiation Oncology

## 2024-04-26 ENCOUNTER — Other Ambulatory Visit: Payer: Self-pay

## 2024-04-26 DIAGNOSIS — C490 Malignant neoplasm of connective and soft tissue of head, face and neck: Secondary | ICD-10-CM | POA: Diagnosis not present

## 2024-04-26 LAB — RAD ONC ARIA SESSION SUMMARY
Course Elapsed Days: 3
Plan Fractions Treated to Date: 4
Plan Prescribed Dose Per Fraction: 2 Gy
Plan Total Fractions Prescribed: 30
Plan Total Prescribed Dose: 60 Gy
Reference Point Dosage Given to Date: 8 Gy
Reference Point Session Dosage Given: 2 Gy
Session Number: 4

## 2024-04-26 NOTE — Telephone Encounter (Signed)
 Clinical Social Work was referred by [facility] for assessment of psychosocial needs.  CSW attempted to contact patient by phone.  Called both mobile and home phones, but could not leave voicemail.

## 2024-04-27 ENCOUNTER — Ambulatory Visit
Admission: RE | Admit: 2024-04-27 | Discharge: 2024-04-27 | Disposition: A | Discharge status: Home / Self Care | Source: Ambulatory Visit | Attending: Radiation Oncology | Admitting: Radiation Oncology

## 2024-04-27 ENCOUNTER — Other Ambulatory Visit: Payer: Self-pay

## 2024-04-27 DIAGNOSIS — C490 Malignant neoplasm of connective and soft tissue of head, face and neck: Secondary | ICD-10-CM | POA: Diagnosis not present

## 2024-04-27 LAB — RAD ONC ARIA SESSION SUMMARY
Course Elapsed Days: 4
Plan Fractions Treated to Date: 5
Plan Prescribed Dose Per Fraction: 2 Gy
Plan Total Fractions Prescribed: 30
Plan Total Prescribed Dose: 60 Gy
Reference Point Dosage Given to Date: 10 Gy
Reference Point Session Dosage Given: 2 Gy
Session Number: 5

## 2024-04-30 ENCOUNTER — Other Ambulatory Visit: Payer: Self-pay

## 2024-04-30 ENCOUNTER — Ambulatory Visit
Admission: RE | Admit: 2024-04-30 | Discharge: 2024-04-30 | Disposition: A | Discharge status: Home / Self Care | Source: Ambulatory Visit | Attending: Radiation Oncology | Admitting: Radiation Oncology

## 2024-04-30 DIAGNOSIS — C490 Malignant neoplasm of connective and soft tissue of head, face and neck: Secondary | ICD-10-CM | POA: Diagnosis not present

## 2024-04-30 LAB — RAD ONC ARIA SESSION SUMMARY
Course Elapsed Days: 7
Plan Fractions Treated to Date: 6
Plan Prescribed Dose Per Fraction: 2 Gy
Plan Total Fractions Prescribed: 30
Plan Total Prescribed Dose: 60 Gy
Reference Point Dosage Given to Date: 12 Gy
Reference Point Session Dosage Given: 2 Gy
Session Number: 6

## 2024-05-01 ENCOUNTER — Other Ambulatory Visit: Payer: Self-pay

## 2024-05-01 ENCOUNTER — Ambulatory Visit
Admission: RE | Admit: 2024-05-01 | Discharge: 2024-05-01 | Disposition: A | Discharge status: Home / Self Care | Source: Ambulatory Visit | Attending: Radiation Oncology | Admitting: Radiation Oncology

## 2024-05-01 DIAGNOSIS — C490 Malignant neoplasm of connective and soft tissue of head, face and neck: Secondary | ICD-10-CM | POA: Diagnosis not present

## 2024-05-01 LAB — RAD ONC ARIA SESSION SUMMARY
Course Elapsed Days: 8
Plan Fractions Treated to Date: 7
Plan Prescribed Dose Per Fraction: 2 Gy
Plan Total Fractions Prescribed: 30
Plan Total Prescribed Dose: 60 Gy
Reference Point Dosage Given to Date: 14 Gy
Reference Point Session Dosage Given: 2 Gy
Session Number: 7

## 2024-05-02 ENCOUNTER — Other Ambulatory Visit: Payer: Self-pay

## 2024-05-02 ENCOUNTER — Ambulatory Visit
Admission: RE | Admit: 2024-05-02 | Discharge: 2024-05-02 | Disposition: A | Discharge status: Home / Self Care | Source: Ambulatory Visit | Attending: Radiation Oncology | Admitting: Radiation Oncology

## 2024-05-02 DIAGNOSIS — C490 Malignant neoplasm of connective and soft tissue of head, face and neck: Secondary | ICD-10-CM | POA: Diagnosis not present

## 2024-05-02 LAB — RAD ONC ARIA SESSION SUMMARY
Course Elapsed Days: 9
Plan Fractions Treated to Date: 8
Plan Prescribed Dose Per Fraction: 2 Gy
Plan Total Fractions Prescribed: 30
Plan Total Prescribed Dose: 60 Gy
Reference Point Dosage Given to Date: 16 Gy
Reference Point Session Dosage Given: 2 Gy
Session Number: 8

## 2024-05-03 ENCOUNTER — Other Ambulatory Visit: Payer: Self-pay

## 2024-05-03 ENCOUNTER — Ambulatory Visit
Admission: RE | Admit: 2024-05-03 | Discharge: 2024-05-03 | Disposition: A | Discharge status: Home / Self Care | Source: Ambulatory Visit | Attending: Radiation Oncology | Admitting: Radiation Oncology

## 2024-05-03 DIAGNOSIS — C490 Malignant neoplasm of connective and soft tissue of head, face and neck: Secondary | ICD-10-CM | POA: Diagnosis not present

## 2024-05-03 LAB — RAD ONC ARIA SESSION SUMMARY
Course Elapsed Days: 10
Plan Fractions Treated to Date: 9
Plan Prescribed Dose Per Fraction: 2 Gy
Plan Total Fractions Prescribed: 30
Plan Total Prescribed Dose: 60 Gy
Reference Point Dosage Given to Date: 18 Gy
Reference Point Session Dosage Given: 2 Gy
Session Number: 9

## 2024-05-04 ENCOUNTER — Other Ambulatory Visit: Payer: Self-pay

## 2024-05-04 ENCOUNTER — Ambulatory Visit
Admission: RE | Admit: 2024-05-04 | Discharge: 2024-05-04 | Disposition: A | Discharge status: Home / Self Care | Source: Ambulatory Visit | Attending: Radiation Oncology | Admitting: Radiation Oncology

## 2024-05-04 DIAGNOSIS — C490 Malignant neoplasm of connective and soft tissue of head, face and neck: Secondary | ICD-10-CM | POA: Diagnosis not present

## 2024-05-04 LAB — RAD ONC ARIA SESSION SUMMARY
Course Elapsed Days: 11
Plan Fractions Treated to Date: 10
Plan Prescribed Dose Per Fraction: 2 Gy
Plan Total Fractions Prescribed: 30
Plan Total Prescribed Dose: 60 Gy
Reference Point Dosage Given to Date: 20 Gy
Reference Point Session Dosage Given: 2 Gy
Session Number: 10

## 2024-05-07 ENCOUNTER — Other Ambulatory Visit: Payer: Self-pay

## 2024-05-07 ENCOUNTER — Ambulatory Visit
Admission: RE | Admit: 2024-05-07 | Discharge: 2024-05-07 | Disposition: A | Discharge status: Home / Self Care | Source: Ambulatory Visit | Attending: Radiation Oncology | Admitting: Radiation Oncology

## 2024-05-07 DIAGNOSIS — C490 Malignant neoplasm of connective and soft tissue of head, face and neck: Secondary | ICD-10-CM | POA: Diagnosis not present

## 2024-05-07 LAB — RAD ONC ARIA SESSION SUMMARY
Course Elapsed Days: 14
Plan Fractions Treated to Date: 11
Plan Prescribed Dose Per Fraction: 2 Gy
Plan Total Fractions Prescribed: 30
Plan Total Prescribed Dose: 60 Gy
Reference Point Dosage Given to Date: 22 Gy
Reference Point Session Dosage Given: 2 Gy
Session Number: 11

## 2024-05-07 MED ORDER — FREESTYLE LIBRE 2 SENSOR MISC: 11 refills | Status: DC

## 2024-05-07 MED ORDER — RYBELSUS 7 MG PO TABS: 1.0000 | ORAL_TABLET | Freq: Every day | ORAL | 3 refills | Status: DC

## 2024-05-08 ENCOUNTER — Other Ambulatory Visit: Payer: Self-pay

## 2024-05-08 ENCOUNTER — Ambulatory Visit
Admission: RE | Admit: 2024-05-08 | Discharge: 2024-05-08 | Disposition: A | Source: Ambulatory Visit | Attending: Radiation Oncology | Admitting: Radiation Oncology

## 2024-05-08 DIAGNOSIS — C490 Malignant neoplasm of connective and soft tissue of head, face and neck: Secondary | ICD-10-CM | POA: Diagnosis not present

## 2024-05-08 LAB — RAD ONC ARIA SESSION SUMMARY
Course Elapsed Days: 15
Plan Fractions Treated to Date: 12
Plan Prescribed Dose Per Fraction: 2 Gy
Plan Total Fractions Prescribed: 30
Plan Total Prescribed Dose: 60 Gy
Reference Point Dosage Given to Date: 24 Gy
Reference Point Session Dosage Given: 2 Gy
Session Number: 12

## 2024-05-09 ENCOUNTER — Other Ambulatory Visit: Payer: Self-pay

## 2024-05-09 ENCOUNTER — Other Ambulatory Visit: Payer: Self-pay | Admitting: Physician Assistant

## 2024-05-09 ENCOUNTER — Ambulatory Visit
Admission: RE | Admit: 2024-05-09 | Discharge: 2024-05-09 | Disposition: A | Source: Ambulatory Visit | Attending: Radiation Oncology | Admitting: Radiation Oncology

## 2024-05-09 DIAGNOSIS — C490 Malignant neoplasm of connective and soft tissue of head, face and neck: Secondary | ICD-10-CM | POA: Diagnosis not present

## 2024-05-09 LAB — RAD ONC ARIA SESSION SUMMARY
Course Elapsed Days: 16
Plan Fractions Treated to Date: 13
Plan Prescribed Dose Per Fraction: 2 Gy
Plan Total Fractions Prescribed: 30
Plan Total Prescribed Dose: 60 Gy
Reference Point Dosage Given to Date: 26 Gy
Reference Point Session Dosage Given: 2 Gy
Session Number: 13

## 2024-05-09 MED ORDER — FREESTYLE LIBRE 2 SENSOR MISC: 3 refills | Status: DC

## 2024-05-10 ENCOUNTER — Other Ambulatory Visit: Payer: Self-pay

## 2024-05-10 ENCOUNTER — Ambulatory Visit
Admission: RE | Admit: 2024-05-10 | Discharge: 2024-05-10 | Disposition: A | Discharge status: Home / Self Care | Source: Ambulatory Visit | Attending: Radiation Oncology | Admitting: Radiation Oncology

## 2024-05-10 ENCOUNTER — Other Ambulatory Visit: Payer: Self-pay | Admitting: Cardiology

## 2024-05-10 DIAGNOSIS — C490 Malignant neoplasm of connective and soft tissue of head, face and neck: Secondary | ICD-10-CM | POA: Diagnosis not present

## 2024-05-10 LAB — RAD ONC ARIA SESSION SUMMARY
Course Elapsed Days: 17
Plan Fractions Treated to Date: 14
Plan Prescribed Dose Per Fraction: 2 Gy
Plan Total Fractions Prescribed: 30
Plan Total Prescribed Dose: 60 Gy
Reference Point Dosage Given to Date: 28 Gy
Reference Point Session Dosage Given: 2 Gy
Session Number: 14

## 2024-05-10 MED ORDER — TRIAMCINOLONE ACETONIDE 0.1 % EX CREA: 1.0000 | TOPICAL_CREAM | Freq: Two times a day (BID) | CUTANEOUS | 1 refills | Status: DC

## 2024-05-11 ENCOUNTER — Ambulatory Visit
Admission: RE | Admit: 2024-05-11 | Discharge: 2024-05-11 | Disposition: A | Discharge status: Home / Self Care | Source: Ambulatory Visit | Attending: Radiation Oncology | Admitting: Radiation Oncology

## 2024-05-11 ENCOUNTER — Other Ambulatory Visit: Payer: Self-pay

## 2024-05-11 DIAGNOSIS — C490 Malignant neoplasm of connective and soft tissue of head, face and neck: Secondary | ICD-10-CM | POA: Diagnosis not present

## 2024-05-11 LAB — RAD ONC ARIA SESSION SUMMARY
Course Elapsed Days: 18
Plan Fractions Treated to Date: 15
Plan Prescribed Dose Per Fraction: 2 Gy
Plan Total Fractions Prescribed: 30
Plan Total Prescribed Dose: 60 Gy
Reference Point Dosage Given to Date: 30 Gy
Reference Point Session Dosage Given: 2 Gy
Session Number: 15

## 2024-05-14 ENCOUNTER — Other Ambulatory Visit: Payer: Self-pay

## 2024-05-14 ENCOUNTER — Ambulatory Visit
Admission: RE | Admit: 2024-05-14 | Discharge: 2024-05-14 | Disposition: A | Source: Ambulatory Visit | Attending: Radiation Oncology | Admitting: Radiation Oncology

## 2024-05-14 DIAGNOSIS — C490 Malignant neoplasm of connective and soft tissue of head, face and neck: Secondary | ICD-10-CM | POA: Diagnosis not present

## 2024-05-14 LAB — RAD ONC ARIA SESSION SUMMARY
Course Elapsed Days: 21
Plan Fractions Treated to Date: 16
Plan Prescribed Dose Per Fraction: 2 Gy
Plan Total Fractions Prescribed: 30
Plan Total Prescribed Dose: 60 Gy
Reference Point Dosage Given to Date: 32 Gy
Reference Point Session Dosage Given: 2 Gy
Session Number: 16

## 2024-05-15 ENCOUNTER — Other Ambulatory Visit: Payer: Self-pay

## 2024-05-15 ENCOUNTER — Ambulatory Visit
Admission: RE | Admit: 2024-05-15 | Discharge: 2024-05-15 | Disposition: A | Discharge status: Home / Self Care | Source: Ambulatory Visit | Attending: Radiation Oncology | Admitting: Radiation Oncology

## 2024-05-15 DIAGNOSIS — C490 Malignant neoplasm of connective and soft tissue of head, face and neck: Secondary | ICD-10-CM | POA: Diagnosis not present

## 2024-05-15 LAB — RAD ONC ARIA SESSION SUMMARY
Course Elapsed Days: 22
Plan Fractions Treated to Date: 17
Plan Prescribed Dose Per Fraction: 2 Gy
Plan Total Fractions Prescribed: 30
Plan Total Prescribed Dose: 60 Gy
Reference Point Dosage Given to Date: 34 Gy
Reference Point Session Dosage Given: 2 Gy
Session Number: 17

## 2024-05-15 MED ORDER — GLIPIZIDE ER 5 MG PO TB24: 5.0000 mg | ORAL_TABLET | Freq: Every day | ORAL | 1 refills | Status: DC

## 2024-05-15 MED ORDER — AMLODIPINE BESYLATE 5 MG PO TABS: 5.0000 mg | ORAL_TABLET | Freq: Every day | ORAL | 0 refills | Status: DC

## 2024-05-15 MED ORDER — LISINOPRIL 10 MG PO TABS: 20.0000 mg | ORAL_TABLET | Freq: Two times a day (BID) | ORAL | 1 refills | Status: DC

## 2024-05-16 ENCOUNTER — Ambulatory Visit
Admission: RE | Admit: 2024-05-16 | Discharge: 2024-05-16 | Disposition: A | Discharge status: Home / Self Care | Source: Ambulatory Visit | Attending: Radiation Oncology | Admitting: Radiation Oncology

## 2024-05-16 ENCOUNTER — Other Ambulatory Visit: Payer: Self-pay

## 2024-05-16 DIAGNOSIS — C490 Malignant neoplasm of connective and soft tissue of head, face and neck: Secondary | ICD-10-CM | POA: Diagnosis not present

## 2024-05-16 LAB — RAD ONC ARIA SESSION SUMMARY
Course Elapsed Days: 23
Plan Fractions Treated to Date: 18
Plan Prescribed Dose Per Fraction: 2 Gy
Plan Total Fractions Prescribed: 30
Plan Total Prescribed Dose: 60 Gy
Reference Point Dosage Given to Date: 36 Gy
Reference Point Session Dosage Given: 2 Gy
Session Number: 18

## 2024-05-21 ENCOUNTER — Ambulatory Visit
Admission: RE | Admit: 2024-05-21 | Discharge: 2024-05-21 | Disposition: A | Discharge status: Home / Self Care | Source: Ambulatory Visit | Attending: Radiation Oncology | Admitting: Radiation Oncology

## 2024-05-21 ENCOUNTER — Other Ambulatory Visit: Payer: Self-pay

## 2024-05-21 DIAGNOSIS — C490 Malignant neoplasm of connective and soft tissue of head, face and neck: Secondary | ICD-10-CM | POA: Diagnosis present

## 2024-05-21 LAB — RAD ONC ARIA SESSION SUMMARY
Course Elapsed Days: 28
Plan Fractions Treated to Date: 19
Plan Prescribed Dose Per Fraction: 2 Gy
Plan Total Fractions Prescribed: 30
Plan Total Prescribed Dose: 60 Gy
Reference Point Dosage Given to Date: 38 Gy
Reference Point Session Dosage Given: 2 Gy
Session Number: 19

## 2024-05-22 ENCOUNTER — Ambulatory Visit
Admission: RE | Admit: 2024-05-22 | Discharge: 2024-05-22 | Disposition: A | Source: Ambulatory Visit | Attending: Radiation Oncology | Admitting: Radiation Oncology

## 2024-05-22 ENCOUNTER — Other Ambulatory Visit: Payer: Self-pay

## 2024-05-22 DIAGNOSIS — C490 Malignant neoplasm of connective and soft tissue of head, face and neck: Secondary | ICD-10-CM | POA: Diagnosis not present

## 2024-05-22 LAB — RAD ONC ARIA SESSION SUMMARY
Course Elapsed Days: 29
Plan Fractions Treated to Date: 20
Plan Prescribed Dose Per Fraction: 2 Gy
Plan Total Fractions Prescribed: 30
Plan Total Prescribed Dose: 60 Gy
Reference Point Dosage Given to Date: 40 Gy
Reference Point Session Dosage Given: 2 Gy
Session Number: 20

## 2024-05-23 ENCOUNTER — Other Ambulatory Visit: Payer: Self-pay

## 2024-05-23 ENCOUNTER — Ambulatory Visit
Admission: RE | Admit: 2024-05-23 | Discharge: 2024-05-23 | Disposition: A | Source: Ambulatory Visit | Attending: Radiation Oncology | Admitting: Radiation Oncology

## 2024-05-23 DIAGNOSIS — C490 Malignant neoplasm of connective and soft tissue of head, face and neck: Secondary | ICD-10-CM | POA: Diagnosis not present

## 2024-05-23 LAB — RAD ONC ARIA SESSION SUMMARY
Course Elapsed Days: 30
Plan Fractions Treated to Date: 21
Plan Prescribed Dose Per Fraction: 2 Gy
Plan Total Fractions Prescribed: 30
Plan Total Prescribed Dose: 60 Gy
Reference Point Dosage Given to Date: 42 Gy
Reference Point Session Dosage Given: 2 Gy
Session Number: 21

## 2024-05-23 MED ORDER — TRIAMCINOLONE ACETONIDE 0.1 % EX OINT
TOPICAL_OINTMENT | CUTANEOUS | 0 refills | Status: DC
  Filled 2024-05-23: qty 15, 30d supply, fill #0

## 2024-05-24 ENCOUNTER — Ambulatory Visit
Admission: RE | Admit: 2024-05-24 | Discharge: 2024-05-24 | Disposition: A | Source: Ambulatory Visit | Attending: Radiation Oncology | Admitting: Radiation Oncology

## 2024-05-24 ENCOUNTER — Other Ambulatory Visit: Payer: Self-pay

## 2024-05-24 DIAGNOSIS — C490 Malignant neoplasm of connective and soft tissue of head, face and neck: Secondary | ICD-10-CM | POA: Diagnosis not present

## 2024-05-24 LAB — RAD ONC ARIA SESSION SUMMARY
Course Elapsed Days: 31
Plan Fractions Treated to Date: 22
Plan Prescribed Dose Per Fraction: 2 Gy
Plan Total Fractions Prescribed: 30
Plan Total Prescribed Dose: 60 Gy
Reference Point Dosage Given to Date: 44 Gy
Reference Point Session Dosage Given: 2 Gy
Session Number: 22

## 2024-05-25 ENCOUNTER — Other Ambulatory Visit: Payer: Self-pay

## 2024-05-25 ENCOUNTER — Ambulatory Visit
Admission: RE | Admit: 2024-05-25 | Discharge: 2024-05-25 | Disposition: A | Source: Ambulatory Visit | Attending: Radiation Oncology | Admitting: Radiation Oncology

## 2024-05-25 DIAGNOSIS — C490 Malignant neoplasm of connective and soft tissue of head, face and neck: Secondary | ICD-10-CM | POA: Diagnosis not present

## 2024-05-25 LAB — RAD ONC ARIA SESSION SUMMARY
Course Elapsed Days: 32
Plan Fractions Treated to Date: 23
Plan Prescribed Dose Per Fraction: 2 Gy
Plan Total Fractions Prescribed: 30
Plan Total Prescribed Dose: 60 Gy
Reference Point Dosage Given to Date: 46 Gy
Reference Point Session Dosage Given: 2 Gy
Session Number: 23

## 2024-05-28 ENCOUNTER — Ambulatory Visit
Admission: RE | Admit: 2024-05-28 | Discharge: 2024-05-28 | Disposition: A | Source: Ambulatory Visit | Attending: Radiation Oncology | Admitting: Radiation Oncology

## 2024-05-28 ENCOUNTER — Other Ambulatory Visit: Payer: Self-pay

## 2024-05-28 DIAGNOSIS — C490 Malignant neoplasm of connective and soft tissue of head, face and neck: Secondary | ICD-10-CM | POA: Diagnosis not present

## 2024-05-28 LAB — RAD ONC ARIA SESSION SUMMARY
Course Elapsed Days: 35
Plan Fractions Treated to Date: 24
Plan Prescribed Dose Per Fraction: 2 Gy
Plan Total Fractions Prescribed: 30
Plan Total Prescribed Dose: 60 Gy
Reference Point Dosage Given to Date: 48 Gy
Reference Point Session Dosage Given: 2 Gy
Session Number: 24

## 2024-05-29 ENCOUNTER — Other Ambulatory Visit: Payer: Self-pay

## 2024-05-29 ENCOUNTER — Ambulatory Visit
Admission: RE | Admit: 2024-05-29 | Discharge: 2024-05-29 | Disposition: A | Source: Ambulatory Visit | Attending: Radiation Oncology | Admitting: Radiation Oncology

## 2024-05-29 DIAGNOSIS — C490 Malignant neoplasm of connective and soft tissue of head, face and neck: Secondary | ICD-10-CM | POA: Diagnosis not present

## 2024-05-29 LAB — RAD ONC ARIA SESSION SUMMARY
Course Elapsed Days: 36
Plan Fractions Treated to Date: 25
Plan Prescribed Dose Per Fraction: 2 Gy
Plan Total Fractions Prescribed: 30
Plan Total Prescribed Dose: 60 Gy
Reference Point Dosage Given to Date: 50 Gy
Reference Point Session Dosage Given: 2 Gy
Session Number: 25

## 2024-05-30 ENCOUNTER — Other Ambulatory Visit: Payer: Self-pay

## 2024-05-30 ENCOUNTER — Ambulatory Visit
Admission: RE | Admit: 2024-05-30 | Discharge: 2024-05-30 | Disposition: A | Discharge status: Home / Self Care | Source: Ambulatory Visit | Attending: Radiation Oncology | Admitting: Radiation Oncology

## 2024-05-30 DIAGNOSIS — C490 Malignant neoplasm of connective and soft tissue of head, face and neck: Secondary | ICD-10-CM | POA: Diagnosis not present

## 2024-05-30 LAB — RAD ONC ARIA SESSION SUMMARY
Course Elapsed Days: 37
Plan Fractions Treated to Date: 26
Plan Prescribed Dose Per Fraction: 2 Gy
Plan Total Fractions Prescribed: 30
Plan Total Prescribed Dose: 60 Gy
Reference Point Dosage Given to Date: 52 Gy
Reference Point Session Dosage Given: 2 Gy
Session Number: 26

## 2024-05-31 ENCOUNTER — Other Ambulatory Visit: Payer: Self-pay

## 2024-05-31 ENCOUNTER — Ambulatory Visit
Admission: RE | Admit: 2024-05-31 | Discharge: 2024-05-31 | Disposition: A | Discharge status: Home / Self Care | Source: Ambulatory Visit | Attending: Radiation Oncology | Admitting: Radiation Oncology

## 2024-05-31 DIAGNOSIS — C490 Malignant neoplasm of connective and soft tissue of head, face and neck: Secondary | ICD-10-CM | POA: Diagnosis not present

## 2024-05-31 LAB — RAD ONC ARIA SESSION SUMMARY
Course Elapsed Days: 38
Plan Fractions Treated to Date: 27
Plan Prescribed Dose Per Fraction: 2 Gy
Plan Total Fractions Prescribed: 30
Plan Total Prescribed Dose: 60 Gy
Reference Point Dosage Given to Date: 54 Gy
Reference Point Session Dosage Given: 2 Gy
Session Number: 27

## 2024-06-01 ENCOUNTER — Ambulatory Visit
Admission: RE | Admit: 2024-06-01 | Discharge: 2024-06-01 | Disposition: A | Source: Ambulatory Visit | Attending: Radiation Oncology | Admitting: Radiation Oncology

## 2024-06-01 ENCOUNTER — Other Ambulatory Visit: Payer: Self-pay

## 2024-06-01 DIAGNOSIS — C490 Malignant neoplasm of connective and soft tissue of head, face and neck: Secondary | ICD-10-CM | POA: Diagnosis not present

## 2024-06-01 LAB — RAD ONC ARIA SESSION SUMMARY
Course Elapsed Days: 39
Plan Fractions Treated to Date: 28
Plan Prescribed Dose Per Fraction: 2 Gy
Plan Total Fractions Prescribed: 30
Plan Total Prescribed Dose: 60 Gy
Reference Point Dosage Given to Date: 56 Gy
Reference Point Session Dosage Given: 2 Gy
Session Number: 28

## 2024-06-04 ENCOUNTER — Other Ambulatory Visit: Payer: Self-pay

## 2024-06-04 ENCOUNTER — Ambulatory Visit
Admission: RE | Admit: 2024-06-04 | Discharge: 2024-06-04 | Disposition: A | Source: Ambulatory Visit | Attending: Radiation Oncology | Admitting: Radiation Oncology

## 2024-06-04 DIAGNOSIS — C490 Malignant neoplasm of connective and soft tissue of head, face and neck: Secondary | ICD-10-CM | POA: Diagnosis not present

## 2024-06-04 LAB — RAD ONC ARIA SESSION SUMMARY
Course Elapsed Days: 42
Plan Fractions Treated to Date: 29
Plan Prescribed Dose Per Fraction: 2 Gy
Plan Total Fractions Prescribed: 30
Plan Total Prescribed Dose: 60 Gy
Reference Point Dosage Given to Date: 58 Gy
Reference Point Session Dosage Given: 2 Gy
Session Number: 29

## 2024-06-05 ENCOUNTER — Other Ambulatory Visit: Payer: Self-pay

## 2024-06-05 ENCOUNTER — Ambulatory Visit
Admission: RE | Admit: 2024-06-05 | Discharge: 2024-06-05 | Disposition: A | Source: Ambulatory Visit | Attending: Radiation Oncology | Admitting: Radiation Oncology

## 2024-06-05 DIAGNOSIS — C490 Malignant neoplasm of connective and soft tissue of head, face and neck: Secondary | ICD-10-CM | POA: Diagnosis not present

## 2024-06-05 LAB — RAD ONC ARIA SESSION SUMMARY
Course Elapsed Days: 43
Plan Fractions Treated to Date: 30
Plan Prescribed Dose Per Fraction: 2 Gy
Plan Total Fractions Prescribed: 30
Plan Total Prescribed Dose: 60 Gy
Reference Point Dosage Given to Date: 60 Gy
Reference Point Session Dosage Given: 2 Gy
Session Number: 30

## 2024-06-07 NOTE — Radiation Completion Notes (Signed)
 Patient Name: Gary Lynn, Gary Lynn MRN: [id number] Date of Birth: 06/13/1931 Referring Physician: STEVAN HAVER, M.D. Date of Service: 2024-06-07 Radiation Oncologist: Marcey Penton, M.D. 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  He has had recurrence with marked thickening and nodularity of the scalp in the area of the prior surgery.  He is fairly asymptomatic.  He is seeing Dr. Jacobo who is ordered a PET CT scan.  He is seen today for radiation oncology opinion.      ==========DELIVERED PLANS==========  First Treatment Date: 2024-04-23 Last Treatment Date: 2024-06-05   Plan Name: HN_BO Site: Scalp Technique: Electron Mode: Electron Dose Per Fraction: 2 Gy Prescribed Dose (Delivered / Prescribed): 60 Gy / 60 Gy Prescribed Fxs (Delivered / Prescribed): 30 / 30     ==========ON TREATMENT VISIT DATES========== 2024-04-24, 2024-05-01, 2024-05-08, 2024-05-15, 2024-05-22, 2024-05-29, 2024-06-05     ==========UPCOMING VISITS========== 07/11/2024 [facility] RAD ONCOLOGY FOLLOW UP 30 Chrystal, Glenn, MD        ==========APPENDIX - ON TREATMENT VISIT NOTES==========   See weekly On Treatment Notes in Epic for details in the Media tab (listed as Progress notes on the On Treatment Visit Dates listed above).

## 2024-06-28 ENCOUNTER — Other Ambulatory Visit: Payer: Self-pay

## 2024-06-28 ENCOUNTER — Other Ambulatory Visit: Payer: Self-pay | Admitting: Cardiology

## 2024-06-28 ENCOUNTER — Telehealth: Payer: Self-pay | Admitting: Cardiology

## 2024-06-28 MED ORDER — FREESTYLE LIBRE 2 SENSOR MISC: 3 refills | Status: DC

## 2024-06-28 NOTE — Telephone Encounter (Signed)
 Needs Libre 2 sensors, needs 6 to last 3 months  [facility] on [address]

## 2024-06-28 NOTE — Telephone Encounter (Signed)
 Request was sent to the patients PCP

## 2024-07-04 ENCOUNTER — Ambulatory Visit
Admission: RE | Admit: 2024-07-04 | Discharge: 2024-07-04 | Disposition: A | Discharge status: Home / Self Care | Attending: Cardiology | Admitting: Cardiology

## 2024-07-04 ENCOUNTER — Ambulatory Visit: Admitting: Cardiology

## 2024-07-04 ENCOUNTER — Encounter: Payer: Self-pay | Admitting: Cardiology

## 2024-07-04 ENCOUNTER — Ambulatory Visit
Admission: RE | Admit: 2024-07-04 | Discharge: 2024-07-04 | Disposition: A | Discharge status: Home / Self Care | Source: Ambulatory Visit | Attending: Cardiology | Admitting: Cardiology

## 2024-07-04 VITALS — BP 116/66 | HR 55 | Ht 63.0 in | Wt 140.0 lb

## 2024-07-04 DIAGNOSIS — R051 Acute cough: Secondary | ICD-10-CM | POA: Insufficient documentation

## 2024-07-04 DIAGNOSIS — I480 Paroxysmal atrial fibrillation: Secondary | ICD-10-CM | POA: Diagnosis not present

## 2024-07-04 DIAGNOSIS — I10 Essential (primary) hypertension: Secondary | ICD-10-CM

## 2024-07-04 DIAGNOSIS — I2510 Atherosclerotic heart disease of native coronary artery without angina pectoris: Secondary | ICD-10-CM

## 2024-07-04 MED ORDER — BENZONATATE 200 MG PO CAPS: 200.0000 mg | ORAL_CAPSULE | Freq: Three times a day (TID) | ORAL | 0 refills | Status: DC | PRN

## 2024-07-04 MED ORDER — ALBUTEROL SULFATE HFA 108 (90 BASE) MCG/ACT IN AERS: 2.0000 | INHALATION_SPRAY | Freq: Four times a day (QID) | RESPIRATORY_TRACT | 2 refills | Status: DC | PRN

## 2024-07-04 NOTE — Progress Notes (Signed)
 "  Established Patient Office Visit  Subjective:  Patient ID: Gary Lynn, male    DOB: 1930/09/03  Age: 89 y.o. MRN: [id number]  Chief Complaint  Patient presents with   Cough    Patient in office for an acute visit, complaining of a dry, persistent cough. Patient also short of breath with minimal exertion. Wheezing on exam. Will get a chest xray for further evaluation. Has been using spouses albuterol  inhaler, will send in a prescription for the patient. Will send in Tessalon  pearls for cough. Patient has a history of CAD and atrial fibrillation, has not seen cardiology since 2020, will send a new referral.  Further management pending results of chest xray.   Cough This is a new problem. The current episode started 1 to 4 weeks ago. The problem has been gradually worsening. The problem occurs constantly. The cough is Non-productive. Associated symptoms include shortness of breath. Pertinent negatives include no chest pain, headaches or myalgias. The symptoms are aggravated by lying down. The treatment provided mild relief.    No other concerns at this time.   Past Medical History:  Diagnosis Date   Carotid artery stenosis    a. carotid doppler 05/2016: RICA 40-59%, LICA 1-39%   Chronic diastolic CHF (congestive heart failure) (HCC)    a. echo 05/2016: EF 55-60%, AK of the basal-midinferolateral and inferior myocardium (c/w prior study), GR1DD, aortic sclerosis without stenosis, mild MR, PASP 40 mmHg   Coronary artery disease    Cardiac cath in February of 2013: EF 60%, high-grade chronic disease in OM 1, occluded mid RCA at the site of a previously placed stent with left-to-right collaterals, occluded left SFA.   Diabetes mellitus with complication (HCC)    Hypercholesterolemia    Hypertension    PAF (paroxysmal atrial fibrillation) (HCC)    a. Zio monitor 05/2016: predominant rhyhtm of sinus with intermittent episodes of Afib/flutter with the longest episode being 6 hours and 51  minutes with an average heart rate of 114 bpm. Occasional PACs and PVCs. b. CHADS2VASc --> 6 (CHF, HTN, age x 2, DM, vascular disease)    Past Surgical History:  Procedure Laterality Date   BACK SURGERY     years ago   CARDIAC CATHETERIZATION  07/2007   [facility]   CARDIAC CATHETERIZATION  07/2011   [facility]   CORONARY ANGIOPLASTY WITH STENT PLACEMENT      Social History   Socioeconomic History   Marital status: Married    Spouse name: Not on file   Number of children: Not on file   Years of education: Not on file   Highest education level: Not on file  Occupational History   Not on file  Tobacco Use   Smoking status: Every Day    Current packs/day: 0.50    Average packs/day: 0.5 packs/day for 50.0 years (25.0 ttl pk-yrs)    Types: Cigarettes    Passive exposure: Current   Smokeless tobacco: Never   Tobacco comments:    1 pack per 3days  Vaping Use   Vaping status: Never Used  Substance and Sexual Activity   Alcohol use: No    Alcohol/week: 0.0 standard drinks of alcohol   Drug use: No   Sexual activity: Not on file  Other Topics Concern   Not on file  Social History Narrative   Not on file   Social Drivers of Health   Tobacco Use: High Risk (07/04/2024)   Patient History    Smoking Tobacco Use: Every Day  Smokeless Tobacco Use: Never    Passive Exposure: Current  Financial Resource Strain: Not on file  Food Insecurity: Not on file  Transportation Needs: Not on file  Physical Activity: Not on file  Stress: Not on file  Social Connections: Not on file  Intimate Partner Violence: Not on file  Depression (PHQ2-9): Medium Risk (03/29/2024)   Depression (PHQ2-9)    PHQ-2 Score: 9  Alcohol Screen: Not on file  Housing: Not on file  Utilities: Not on file  Health Literacy: Not on file    Family History  Problem Relation Age of Onset   Dementia Mother    Heart attack Father    Kidney disease Neg Hx    Prostate cancer Neg Hx     Allergies[1]  Show/hide  medication list[2]  Review of Systems  Constitutional: Negative.   HENT: Negative.    Eyes: Negative.   Respiratory:  Positive for cough and shortness of breath.   Cardiovascular: Negative.  Negative for chest pain and leg swelling.  Gastrointestinal: Negative.  Negative for abdominal pain, constipation and diarrhea.  Genitourinary: Negative.   Musculoskeletal:  Negative for joint pain and myalgias.  Skin: Negative.   Neurological: Negative.  Negative for dizziness and headaches.  Endo/Heme/Allergies: Negative.   All other systems reviewed and are negative.      Objective:   BP 116/66   Pulse (!) 55   Ht 5' 3 (1.6 m)   Wt 140 lb (63.5 kg)   SpO2 (!) 88%   BMI 24.80 kg/m   Vitals:   07/04/24 1029  BP: 116/66  Pulse: (!) 55  Height: 5' 3 (1.6 m)  Weight: 140 lb (63.5 kg)  SpO2: (!) 88%  BMI (Calculated): 24.81    Physical Exam Nursing note reviewed.  Constitutional:      Appearance: Normal appearance. He is normal weight.  HENT:     Head: Normocephalic and atraumatic.     Nose: Nose normal.     Mouth/Throat:     Mouth: Mucous membranes are moist.     Pharynx: Oropharynx is clear.  Eyes:     Extraocular Movements: Extraocular movements intact.     Conjunctiva/sclera: Conjunctivae normal.     Pupils: Pupils are equal, round, and reactive to light.  Cardiovascular:     Rate and Rhythm: Normal rate and regular rhythm.     Pulses: Normal pulses.     Heart sounds: Normal heart sounds.  Pulmonary:     Effort: Pulmonary effort is normal.     Breath sounds: Wheezing present.  Abdominal:     General: Abdomen is flat. Bowel sounds are normal.     Palpations: Abdomen is soft.  Musculoskeletal:        General: No swelling. Normal range of motion.     Cervical back: Normal range of motion.     Right lower leg: No edema.     Left lower leg: No edema.  Skin:    General: Skin is warm and dry.  Neurological:     General: No focal deficit present.     Mental  Status: He is alert and oriented to person, place, and time.  Psychiatric:        Mood and Affect: Mood normal.        Behavior: Behavior normal.        Thought Content: Thought content normal.        Judgment: Judgment normal.      No results found for any visits  on 07/04/24.  Recent Results (from the past 2160 hours)  Glucose, capillary     Status: Abnormal   Collection Time: 04/10/24 10:23 AM  Result Value Ref Range   Glucose-Capillary 148 (H) 70 - 99 mg/dL    Comment: Glucose reference range applies only to samples taken after fasting for at least 8 hours.  [facility] Session Summary     Status: None   Collection Time: 04/23/24  1:38 PM  Result Value Ref Range   Course ID [email or web address]    Course Intent Curative    Course Start Date 04/12/2024 12:15 PM    Session Number 1    Course First Treatment Date 04/23/2024  1:38 PM    Course Last Treatment Date 04/23/2024  1:38 PM    Course Elapsed Days 0    Reference Point ID HN    Reference Point Dosage Given to Date 2 Gy   Reference Point Session Dosage Given 2 Gy   Plan ID [email or web address]    Plan Name 6.1cm    Plan Fractions Treated to Date 1    Plan Total Fractions Prescribed 30    Plan Prescribed Dose Per Fraction 2 Gy   Plan Total Prescribed Dose 60.000000 Gy   Plan Primary Reference Point HN   Rad Onc [facility]ria Session Summary     Status: None   Collection Time: 04/24/24 11:05 AM  Result Value Ref Range   Course ID [email or web address]    Course Intent Curative    Course Start Date 04/12/2024 12:15 PM    Session Number 2    Course First Treatment Date 04/23/2024  1:38 PM    Course Last Treatment Date 04/24/2024 11:05 AM    Course Elapsed Days 1    Reference Point ID HN    Reference Point Dosage Given to Date 4 Gy   Reference Point Session Dosage Given 2 Gy   Plan ID [email or web address]    Plan Name 6.1cm    Plan Fractions Treated to Date 2    Plan Total Fractions Prescribed 30    Plan Prescribed Dose Per Fraction 2 Gy   Plan Total Prescribed Dose 60.000000 Gy    Plan Primary Reference Point HN   Rad Onc [facility]ria Session Summary     Status: None   Collection Time: 04/25/24  2:00 PM  Result Value Ref Range   Course ID [email or web address]    Course Intent Curative    Course Start Date 04/12/2024 12:15 PM    Session Number 3    Course First Treatment Date 04/23/2024  1:38 PM    Course Last Treatment Date 04/25/2024  1:59 PM    Course Elapsed Days 2    Reference Point ID HN    Reference Point Dosage Given to Date 6 Gy   Reference Point Session Dosage Given 2 Gy   Plan ID HN_BO    Plan Name 6.1cm    Plan Fractions Treated to Date 3    Plan Total Fractions Prescribed 30    Plan Prescribed Dose Per Fraction 2 Gy   Plan Total Prescribed Dose 60.000000 Gy   Plan Primary Reference Point HN   Rad Onc Aria Session Summary     Status: None   Collection Time: 04/26/24  1:30 PM  Result Value Ref Range   Course ID [email or web address]    Course Intent Curative    Course Start Date 04/12/2024 12:15 PM    Session Number 4    Course First Treatment Date 04/23/2024  1:38 PM    Course Last Treatment Date 04/26/2024  1:29 PM    Course Elapsed Days 3    Reference Point ID HN    Reference Point Dosage Given to Date 8 Gy   Reference Point Session Dosage Given 2 Gy   Plan ID HN_BO    Plan Name 6.1cm    Plan Fractions Treated to Date 4    Plan Total Fractions Prescribed 30    Plan Prescribed Dose Per Fraction 2 Gy   Plan Total Prescribed Dose 60.000000 Gy   Plan Primary Reference Point HN   Rad Onc Aria Session Summary     Status: None   Collection Time: 04/27/24  9:46 AM  Result Value Ref Range   Course ID [email or web address]    Course Intent Curative    Course Start Date 04/12/2024 12:15 PM    Session Number 5    Course First Treatment Date 04/23/2024  1:38 PM    Course Last Treatment Date 04/27/2024  9:45 AM    Course Elapsed Days 4    Reference Point ID HN    Reference Point Dosage Given to Date 10 Gy   Reference Point Session Dosage Given 2 Gy   Plan ID HN_BO    Plan Name 6.1cm    Plan  Fractions Treated to Date 5    Plan Total Fractions Prescribed 30    Plan Prescribed Dose Per Fraction 2 Gy   Plan Total Prescribed Dose 60.000000 Gy   Plan Primary Reference Point HN   Rad Onc Aria Session Summary     Status: None   Collection Time: 04/30/24  1:04 PM  Result Value Ref Range   Course ID [email or web address]    Course Intent Curative    Course Start Date 04/12/2024 12:15 PM    Session Number 6    Course First Treatment Date 04/23/2024  1:38 PM    Course Last Treatment Date 04/30/2024  1:03 PM    Course Elapsed Days 7    Reference Point ID HN    Reference Point Dosage Given to Date 12 Gy   Reference Point Session Dosage Given 2 Gy   Plan ID HN_BO    Plan Name 6.1cm    Plan Fractions Treated to Date 6    Plan Total Fractions Prescribed 30    Plan Prescribed Dose Per Fraction 2 Gy   Plan Total Prescribed Dose 60.000000 Gy   Plan Primary Reference Point HN   Rad Onc Aria Session Summary     Status: None   Collection Time: 05/01/24  1:05 PM  Result Value Ref Range   Course ID [email or web address]    Course Intent Curative    Course Start Date 04/12/2024 12:15 PM    Session Number 7    Course First Treatment Date 04/23/2024  1:38 PM    Course Last Treatment Date 05/01/2024  1:04 PM    Course Elapsed Days 8    Reference Point ID HN    Reference Point Dosage Given to Date 14 Gy   Reference Point Session Dosage Given 2 Gy   Plan ID HN_BO    Plan Name 6.1cm    Plan Fractions Treated to Date 7    Plan Total Fractions Prescribed 30    Plan Prescribed Dose Per Fraction 2 Gy   Plan Total Prescribed Dose 60.000000 Gy   Plan Primary Reference Point HN   Rad Onc Aria Session Summary     Status: None   Collection  Time: 05/02/24  1:07 PM  Result Value Ref Range   Course ID [email or web address]    Course Intent Curative    Course Start Date 04/12/2024 12:15 PM    Session Number 8    Course First Treatment Date 04/23/2024  1:38 PM    Course Last Treatment Date 05/02/2024  1:07 PM    Course Elapsed Days 9     Reference Point ID HN    Reference Point Dosage Given to Date 16 Gy   Reference Point Session Dosage Given 2 Gy   Plan ID HN_BO    Plan Name 6.1cm    Plan Fractions Treated to Date 8    Plan Total Fractions Prescribed 30    Plan Prescribed Dose Per Fraction 2 Gy   Plan Total Prescribed Dose 60.000000 Gy   Plan Primary Reference Point HN   Rad Onc Aria Session Summary     Status: None   Collection Time: 05/03/24  1:06 PM  Result Value Ref Range   Course ID [email or web address]    Course Intent Curative    Course Start Date 04/12/2024 12:15 PM    Session Number 9    Course First Treatment Date 04/23/2024  1:38 PM    Course Last Treatment Date 05/03/2024  1:06 PM    Course Elapsed Days 10    Reference Point ID HN    Reference Point Dosage Given to Date 18 Gy   Reference Point Session Dosage Given 2 Gy   Plan ID HN_BO    Plan Name 6.1cm    Plan Fractions Treated to Date 9    Plan Total Fractions Prescribed 30    Plan Prescribed Dose Per Fraction 2 Gy   Plan Total Prescribed Dose 60.000000 Gy   Plan Primary Reference Point HN   Rad Onc Aria Session Summary     Status: None   Collection Time: 05/04/24 11:42 AM  Result Value Ref Range   Course ID [email or web address]    Course Intent Curative    Course Start Date 04/12/2024 12:15 PM    Session Number 10    Course First Treatment Date 04/23/2024  1:38 PM    Course Last Treatment Date 05/04/2024 11:41 AM    Course Elapsed Days 11    Reference Point ID HN    Reference Point Dosage Given to Date 20 Gy   Reference Point Session Dosage Given 2 Gy   Plan ID HN_BO    Plan Name 6.1cm    Plan Fractions Treated to Date 10    Plan Total Fractions Prescribed 30    Plan Prescribed Dose Per Fraction 2 Gy   Plan Total Prescribed Dose 60.000000 Gy   Plan Primary Reference Point HN   Rad Onc Aria Session Summary     Status: None   Collection Time: 05/07/24  1:05 PM  Result Value Ref Range   Course ID [email or web address]    Course Intent Curative    Course Start Date 04/12/2024  12:15 PM    Session Number 11    Course First Treatment Date 04/23/2024  1:38 PM    Course Last Treatment Date 05/07/2024  1:05 PM    Course Elapsed Days 14    Reference Point ID HN    Reference Point Dosage Given to Date 22 Gy   Reference Point Session Dosage Given 2 Gy   Plan ID HN_BO    Plan Name 6.1cm    Plan Fractions Treated to Date 62    Plan Total  Fractions Prescribed 30    Plan Prescribed Dose Per Fraction 2 Gy   Plan Total Prescribed Dose 60.000000 Gy   Plan Primary Reference Point HN   Rad Onc Aria Session Summary     Status: None   Collection Time: 05/08/24  1:07 PM  Result Value Ref Range   Course ID [email or web address]    Course Intent Curative    Course Start Date 04/12/2024 12:15 PM    Session Number 12    Course First Treatment Date 04/23/2024  1:38 PM    Course Last Treatment Date 05/08/2024  1:07 PM    Course Elapsed Days 15    Reference Point ID HN    Reference Point Dosage Given to Date 24 Gy   Reference Point Session Dosage Given 2 Gy   Plan ID HN_BO    Plan Name 6.1cm    Plan Fractions Treated to Date 12    Plan Total Fractions Prescribed 30    Plan Prescribed Dose Per Fraction 2 Gy   Plan Total Prescribed Dose 60.000000 Gy   Plan Primary Reference Point HN   Rad Onc Aria Session Summary     Status: None   Collection Time: 05/09/24  1:08 PM  Result Value Ref Range   Course ID [email or web address]    Course Intent Curative    Course Start Date 04/12/2024 12:15 PM    Session Number 13    Course First Treatment Date 04/23/2024  1:38 PM    Course Last Treatment Date 05/09/2024  1:08 PM    Course Elapsed Days 16    Reference Point ID HN    Reference Point Dosage Given to Date 26 Gy   Reference Point Session Dosage Given 2 Gy   Plan ID HN_BO    Plan Name 6.1cm    Plan Fractions Treated to Date 13    Plan Total Fractions Prescribed 30    Plan Prescribed Dose Per Fraction 2 Gy   Plan Total Prescribed Dose 60.000000 Gy   Plan Primary Reference Point HN   Rad Onc Aria Session  Summary     Status: None   Collection Time: 05/10/24  1:06 PM  Result Value Ref Range   Course ID C1_HN    Course Intent Curative    Course Start Date 04/12/2024 12:15 PM    Session Number 14    Course First Treatment Date 04/23/2024  1:38 PM    Course Last Treatment Date 05/10/2024  1:05 PM    Course Elapsed Days 17    Reference Point ID HN    Reference Point Dosage Given to Date 28 Gy   Reference Point Session Dosage Given 2 Gy   Plan ID HN_BO    Plan Name 6.1cm    Plan Fractions Treated to Date 14    Plan Total Fractions Prescribed 30    Plan Prescribed Dose Per Fraction 2 Gy   Plan Total Prescribed Dose 60.000000 Gy   Plan Primary Reference Point HN   Rad Onc Aria Session Summary     Status: None   Collection Time: 05/11/24 10:53 AM  Result Value Ref Range   Course ID [email or web address]    Course Intent Curative    Course Start Date 04/12/2024 12:15 PM    Session Number 15    Course First Treatment Date 04/23/2024  1:38 PM    Course Last Treatment Date 05/11/2024 10:52 AM    Course Elapsed Days 18    Reference Point ID HN  Reference Point Dosage Given to Date 30 Gy   Reference Point Session Dosage Given 2 Gy   Plan ID HN_BO    Plan Name 6.1cm    Plan Fractions Treated to Date 15    Plan Total Fractions Prescribed 30    Plan Prescribed Dose Per Fraction 2 Gy   Plan Total Prescribed Dose 60.000000 Gy   Plan Primary Reference Point HN   Rad Onc Aria Session Summary     Status: None   Collection Time: 05/14/24  1:11 PM  Result Value Ref Range   Course ID [email or web address]    Course Intent Curative    Course Start Date 04/12/2024 12:15 PM    Session Number 16    Course First Treatment Date 04/23/2024  1:38 PM    Course Last Treatment Date 05/14/2024  1:10 PM    Course Elapsed Days 21    Reference Point ID HN    Reference Point Dosage Given to Date 32 Gy   Reference Point Session Dosage Given 2 Gy   Plan ID HN_BO    Plan Name 6.1cm    Plan Fractions Treated to Date 16    Plan Total  Fractions Prescribed 30    Plan Prescribed Dose Per Fraction 2 Gy   Plan Total Prescribed Dose 60.000000 Gy   Plan Primary Reference Point HN   Rad Onc Aria Session Summary     Status: None   Collection Time: 05/15/24  1:08 PM  Result Value Ref Range   Course ID [email or web address]    Course Intent Curative    Course Start Date 04/12/2024 12:15 PM    Session Number 17    Course First Treatment Date 04/23/2024  1:38 PM    Course Last Treatment Date 05/15/2024  1:08 PM    Course Elapsed Days 22    Reference Point ID HN    Reference Point Dosage Given to Date 34 Gy   Reference Point Session Dosage Given 2 Gy   Plan ID HN_BO    Plan Name 6.1cm    Plan Fractions Treated to Date 17    Plan Total Fractions Prescribed 30    Plan Prescribed Dose Per Fraction 2 Gy   Plan Total Prescribed Dose 60.000000 Gy   Plan Primary Reference Point HN   Rad Onc Aria Session Summary     Status: None   Collection Time: 05/16/24 10:51 AM  Result Value Ref Range   Course ID C1_HN    Course Intent Curative    Course Start Date 04/12/2024 12:15 PM    Session Number 18    Course First Treatment Date 04/23/2024  1:38 PM    Course Last Treatment Date 05/16/2024 10:50 AM    Course Elapsed Days 23    Reference Point ID HN    Reference Point Dosage Given to Date 36 Gy   Reference Point Session Dosage Given 2 Gy   Plan ID HN_BO    Plan Name 6.1cm    Plan Fractions Treated to Date 73    Plan Total Fractions Prescribed 30    Plan Prescribed Dose Per Fraction 2 Gy   Plan Total Prescribed Dose 60.000000 Gy   Plan Primary Reference Point HN   Rad Onc Aria Session Summary     Status: None   Collection Time: 05/21/24  1:05 PM  Result Value Ref Range   Course ID C1_HN    Course Intent Curative    Course Start Date 04/12/2024 12:15 PM  Session Number 19    Course First Treatment Date 04/23/2024  1:38 PM    Course Last Treatment Date 05/21/2024  1:04 PM    Course Elapsed Days 28    Reference Point ID HN    Reference Point  Dosage Given to Date 38 Gy   Reference Point Session Dosage Given 2 Gy   Plan ID HN_BO    Plan Name 6.1cm    Plan Fractions Treated to Date 48    Plan Total Fractions Prescribed 30    Plan Prescribed Dose Per Fraction 2 Gy   Plan Total Prescribed Dose 60.000000 Gy   Plan Primary Reference Point HN   Rad Onc Aria Session Summary     Status: None   Collection Time: 05/22/24  1:12 PM  Result Value Ref Range   Course ID [email or web address]    Course Intent Curative    Course Start Date 04/12/2024 12:15 PM    Session Number 20    Course First Treatment Date 04/23/2024  1:38 PM    Course Last Treatment Date 05/22/2024  1:12 PM    Course Elapsed Days 29    Reference Point ID HN    Reference Point Dosage Given to Date 40 Gy   Reference Point Session Dosage Given 2 Gy   Plan ID HN_BO    Plan Name 6.1cm    Plan Fractions Treated to Date 20    Plan Total Fractions Prescribed 30    Plan Prescribed Dose Per Fraction 2 Gy   Plan Total Prescribed Dose 60.000000 Gy   Plan Primary Reference Point HN   Rad Onc Aria Session Summary     Status: None   Collection Time: 05/23/24  9:25 AM  Result Value Ref Range   Course ID [email or web address]    Course Intent Curative    Course Start Date 04/12/2024 12:15 PM    Session Number 21    Course First Treatment Date 04/23/2024  1:38 PM    Course Last Treatment Date 05/23/2024  9:24 AM    Course Elapsed Days 30    Reference Point ID HN    Reference Point Dosage Given to Date 42 Gy   Reference Point Session Dosage Given 2 Gy   Plan ID HN_BO    Plan Name 6.1cm    Plan Fractions Treated to Date 21    Plan Total Fractions Prescribed 30    Plan Prescribed Dose Per Fraction 2 Gy   Plan Total Prescribed Dose 60.000000 Gy   Plan Primary Reference Point HN   Rad Onc Aria Session Summary     Status: None   Collection Time: 05/24/24  1:07 PM  Result Value Ref Range   Course ID [email or web address]    Course Intent Curative    Course Start Date 04/12/2024 12:15 PM    Session Number 22    Course  First Treatment Date 04/23/2024  1:38 PM    Course Last Treatment Date 05/24/2024  1:06 PM    Course Elapsed Days 31    Reference Point ID HN    Reference Point Dosage Given to Date 44 Gy   Reference Point Session Dosage Given 2 Gy   Plan ID HN_BO    Plan Name 6.1cm    Plan Fractions Treated to Date 22    Plan Total Fractions Prescribed 30    Plan Prescribed Dose Per Fraction 2 Gy   Plan Total Prescribed Dose 60.000000 Gy   Plan Primary Reference Point HN   Rad  [facility] Session Summary     Status: None   Collection Time: 05/25/24 12:47 PM  Result Value Ref Range   Course ID [email or web address]    Course Intent Curative    Course Start Date 04/12/2024 12:15 PM    Session Number 23    Course First Treatment Date 04/23/2024  1:38 PM    Course Last Treatment Date 05/25/2024 12:47 PM    Course Elapsed Days 32    Reference Point ID HN    Reference Point Dosage Given to Date 46 Gy   Reference Point Session Dosage Given 2 Gy   Plan ID HN_BO    Plan Name 6.1cm    Plan Fractions Treated to Date 23    Plan Total Fractions Prescribed 30    Plan Prescribed Dose Per Fraction 2 Gy   Plan Total Prescribed Dose 60.000000 Gy   Plan Primary Reference Point HN   Rad Onc Aria Session Summary     Status: None   Collection Time: 05/28/24 11:21 AM  Result Value Ref Range   Course ID C1_HN    Course Intent Curative    Course Start Date 04/12/2024 12:15 PM    Session Number 24    Course First Treatment Date 04/23/2024  1:38 PM    Course Last Treatment Date 05/28/2024 11:20 AM    Course Elapsed Days 35    Reference Point ID HN    Reference Point Dosage Given to Date 48 Gy   Reference Point Session Dosage Given 2 Gy   Plan ID HN_BO    Plan Name 6.1cm    Plan Fractions Treated to Date 24    Plan Total Fractions Prescribed 30    Plan Prescribed Dose Per Fraction 2 Gy   Plan Total Prescribed Dose 60.000000 Gy   Plan Primary Reference Point HN   Rad Onc Aria Session Summary     Status: None   Collection Time:  05/29/24  1:06 PM  Result Value Ref Range   Course ID C1_HN    Course Intent Curative    Course Start Date 04/12/2024 12:15 PM    Session Number 25    Course First Treatment Date 04/23/2024  1:38 PM    Course Last Treatment Date 05/29/2024  1:06 PM    Course Elapsed Days 36    Reference Point ID HN    Reference Point Dosage Given to Date 50 Gy   Reference Point Session Dosage Given 2 Gy   Plan ID HN_BO    Plan Name 6.1cm    Plan Fractions Treated to Date 25    Plan Total Fractions Prescribed 30    Plan Prescribed Dose Per Fraction 2 Gy   Plan Total Prescribed Dose 60.000000 Gy   Plan Primary Reference Point HN   Rad Onc Aria Session Summary     Status: None   Collection Time: 05/30/24  1:07 PM  Result Value Ref Range   Course ID C1_HN    Course Intent Curative    Course Start Date 04/12/2024 12:15 PM    Session Number 26    Course First Treatment Date 04/23/2024  1:38 PM    Course Last Treatment Date 05/30/2024  1:06 PM    Course Elapsed Days 37    Reference Point ID HN    Reference Point Dosage Given to Date 52 Gy   Reference Point Session Dosage Given 2 Gy   Plan ID HN_BO    Plan Name 6.1cm    Plan  Fractions Treated to Date 26    Plan Total Fractions Prescribed 30    Plan Prescribed Dose Per Fraction 2 Gy   Plan Total Prescribed Dose 60.000000 Gy   Plan Primary Reference Point HN   Rad Onc Aria Session Summary     Status: None   Collection Time: 05/31/24  1:10 PM  Result Value Ref Range   Course ID [email or web address]    Course Intent Curative    Course Start Date 04/12/2024 12:15 PM    Session Number 27    Course First Treatment Date 04/23/2024  1:38 PM    Course Last Treatment Date 05/31/2024  1:10 PM    Course Elapsed Days 38    Reference Point ID HN    Reference Point Dosage Given to Date 54 Gy   Reference Point Session Dosage Given 2 Gy   Plan ID HN_BO    Plan Name 6.1cm    Plan Fractions Treated to Date 27    Plan Total Fractions Prescribed 30    Plan Prescribed Dose Per  Fraction 2 Gy   Plan Total Prescribed Dose 60.000000 Gy   Plan Primary Reference Point HN   Rad Onc Aria Session Summary     Status: None   Collection Time: 06/01/24  9:42 AM  Result Value Ref Range   Course ID C1_HN    Course Intent Curative    Course Start Date 04/12/2024 12:15 PM    Session Number 28    Course First Treatment Date 04/23/2024  1:38 PM    Course Last Treatment Date 06/01/2024  9:42 AM    Course Elapsed Days 39    Reference Point ID HN    Reference Point Dosage Given to Date 56 Gy   Reference Point Session Dosage Given 2 Gy   Plan ID HN_BO    Plan Name 6.1cm    Plan Fractions Treated to Date 28    Plan Total Fractions Prescribed 30    Plan Prescribed Dose Per Fraction 2 Gy   Plan Total Prescribed Dose 60.000000 Gy   Plan Primary Reference Point HN   Rad Onc Aria Session Summary     Status: None   Collection Time: 06/04/24  1:08 PM  Result Value Ref Range   Course ID C1_HN    Course Intent Curative    Course Start Date 04/12/2024 12:15 PM    Session Number 29    Course First Treatment Date 04/23/2024  1:38 PM    Course Last Treatment Date 06/04/2024  1:08 PM    Course Elapsed Days 42    Reference Point ID HN    Reference Point Dosage Given to Date 58 Gy   Reference Point Session Dosage Given 2 Gy   Plan ID HN_BO    Plan Name 6.1cm    Plan Fractions Treated to Date 29    Plan Total Fractions Prescribed 30    Plan Prescribed Dose Per Fraction 2 Gy   Plan Total Prescribed Dose 60.000000 Gy   Plan Primary Reference Point HN   Rad Onc Aria Session Summary     Status: None   Collection Time: 06/05/24  1:05 PM  Result Value Ref Range   Course ID C1_HN    Course Intent Curative    Course Start Date 04/12/2024 12:15 PM    Session Number 30    Course First Treatment Date 04/23/2024  1:38 PM    Course Last Treatment Date 06/05/2024  1:04 PM    Course  Elapsed Days 43    Reference Point ID HN    Reference Point Dosage Given to Date 60 Gy   Reference Point  Session Dosage Given 2 Gy   Plan ID HN_BO    Plan Name 6.1cm    Plan Fractions Treated to Date 30    Plan Total Fractions Prescribed 30    Plan Prescribed Dose Per Fraction 2 Gy   Plan Total Prescribed Dose 60.000000 Gy   Plan Primary Reference Point HN       Assessment & Plan:  Chest xray Albuterol  Tessalon  pearls Referral sent to cardiology  Problem List Items Addressed This Visit       Cardiovascular and Mediastinum   Coronary artery disease   Relevant Orders   Ambulatory referral to Cardiology   Essential hypertension - Primary   PAF (paroxysmal atrial fibrillation) (HCC)   Relevant Orders   Ambulatory referral to Cardiology   Other Visit Diagnoses       Acute cough       Relevant Orders   DG Chest 2 View (Completed)       Return in about 2 weeks (around 07/18/2024).   Total time spent: 25 minutes. This time includes review of previous notes and results and patient face to face interaction during today's visit.    Jeoffrey Pollen, NP  07/04/2024   This document may have been prepared by [facility] Voice Recognition software and as such may include unintentional dictation errors.      [1] No Known Allergies [2]  Outpatient Medications Prior to Visit  Medication Sig   amLODipine  (NORVASC ) 5 MG tablet Take 1 tablet (5 mg total) by mouth daily.   atorvastatin  (LIPITOR ) 40 MG tablet Take 1 tablet (40 mg total) by mouth daily.   Continuous Glucose Sensor (FREESTYLE LIBRE 2 SENSOR) MISC APPLY ONE SENSOR EVERY 14 DAYS FOR USE WITH READER TO MONITOR BLOOD SUGAR VALUES   finasteride  (PROSCAR ) 5 MG tablet Take 1 tablet (5 mg total) by mouth daily.   fish oil-omega-3 fatty acids 1000 MG capsule Take 1 g by mouth 2 (two) times daily.    glipiZIDE  (GLUCOTROL  XL) 5 MG 24 hr tablet Take 1 tablet (5 mg total) by mouth daily before supper.   lisinopril  (ZESTRIL ) 10 MG tablet TAKE 2 TABLETS BY MOUTH TWICE DAILY BEFORE A MEAL   Magnesium  250 MG TABS Take 250 mg by mouth  daily.   Rivaroxaban  (XARELTO ) 15 MG TABS tablet Take 1 tablet (15 mg total) by mouth daily with supper.   RYBELSUS  7 MG TABS Take 1 tablet (7 mg total) by mouth daily.   tamsulosin  (FLOMAX ) 0.4 MG CAPS capsule Take 1 capsule (0.4 mg total) by mouth daily. TAKE 1 CAPSULE(0.4 MG) BY MOUTH DAILY   triamcinolone  0.1%-Eucerin equivalent 1:1 cream mixture Apply 1 Application topically 2 (two) times daily.   triamcinolone  ointment (KENALOG ) 0.1 % Apply 1 Application topically 2 (two) times daily.   triamcinolone  ointment (KENALOG ) 0.1 % Apply once-twice daily as needed for flares. Only use until resolved or up to two weeks out of a month. Avoid application to face armpits and groin   Vibegron  (GEMTESA ) 75 MG TABS Take 1 tablet by mouth once daily   cefpodoxime  (VANTIN ) 100 MG tablet Take 1 tablet (100 mg total) by mouth 2 (two) times daily. (Patient not taking: Reported on 07/04/2024)   No facility-administered medications prior to visit.   "

## 2024-07-05 ENCOUNTER — Ambulatory Visit: Payer: Self-pay | Admitting: Cardiology

## 2024-07-05 ENCOUNTER — Other Ambulatory Visit: Payer: Self-pay | Admitting: Cardiology

## 2024-07-05 MED ORDER — FUROSEMIDE 20 MG PO TABS: 20.0000 mg | ORAL_TABLET | Freq: Every day | ORAL | 0 refills | Status: DC

## 2024-07-05 MED ORDER — AZITHROMYCIN 250 MG PO TABS: ORAL_TABLET | ORAL | 0 refills | Status: AC

## 2024-07-11 ENCOUNTER — Encounter: Payer: Self-pay | Admitting: Radiation Oncology

## 2024-07-11 ENCOUNTER — Ambulatory Visit
Admission: RE | Admit: 2024-07-11 | Discharge: 2024-07-11 | Disposition: A | Discharge status: Home / Self Care | Source: Ambulatory Visit | Attending: Radiation Oncology | Admitting: Radiation Oncology

## 2024-07-11 VITALS — BP 115/77 | HR 110 | Temp 97.1°F | Resp 16

## 2024-07-11 DIAGNOSIS — Z923 Personal history of irradiation: Secondary | ICD-10-CM | POA: Insufficient documentation

## 2024-07-11 DIAGNOSIS — D044 Carcinoma in situ of skin of scalp and neck: Secondary | ICD-10-CM | POA: Insufficient documentation

## 2024-07-11 NOTE — Progress Notes (Signed)
 Radiation Oncology Follow up Note  Name: Gary Lynn   Date:   07/11/2024 MRN:  [id number] DOB: 05-30-1931    This 89 y.o. 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  REFERRING PROVIDER: Carin Gauze, NP  HPI: Patient is a 89 year old male now at 1 month of her received electron beam therapy with scalp for recurrence of atypical fibroxanthoma after Mohs chemo surgery.  Seen today and follow-up he is doing well he is without complaint on examination has had a complete response with no evidence of mass or nodularity or involvement of his scalp at this time.  He has some small other areas of slight ulceration not thought to be clinically significant..  COMPLICATIONS OF TREATMENT: none  FOLLOW UP COMPLIANCE: keeps appointments   PHYSICAL EXAM:  BP 115/77   Pulse (!) 110   Temp (!) 97.1 F (36.2 C) (Tympanic)   Resp 16  No evidence of disease in the scalp region noted.  No evidence of adenopathy in the head and neck region.  RADIOLOGY RESULTS: No current films for review  PLAN: Present time patient seems without a complete response with no evidence of disease at this time.  I will see him back 1 more time in 6 months.  Is already back under care of his dermatology.  I be happy to reevaluate the patient in time to that be indicated.  I would like to take this opportunity to thank you for allowing me to participate in the care of your patient.SABRA Marcey Penton, MD

## 2024-07-13 ENCOUNTER — Encounter: Payer: Self-pay | Admitting: Cardiovascular Disease

## 2024-07-13 ENCOUNTER — Ambulatory Visit: Attending: Cardiovascular Disease | Admitting: Cardiovascular Disease

## 2024-07-13 VITALS — BP 108/64 | HR 119 | Ht 63.0 in | Wt 140.4 lb

## 2024-07-13 DIAGNOSIS — I6523 Occlusion and stenosis of bilateral carotid arteries: Secondary | ICD-10-CM

## 2024-07-13 DIAGNOSIS — E785 Hyperlipidemia, unspecified: Secondary | ICD-10-CM | POA: Diagnosis not present

## 2024-07-13 DIAGNOSIS — I10 Essential (primary) hypertension: Secondary | ICD-10-CM

## 2024-07-13 DIAGNOSIS — I2510 Atherosclerotic heart disease of native coronary artery without angina pectoris: Secondary | ICD-10-CM

## 2024-07-13 DIAGNOSIS — I4892 Unspecified atrial flutter: Secondary | ICD-10-CM

## 2024-07-13 MED ORDER — METOPROLOL TARTRATE 25 MG PO TABS: 25.0000 mg | ORAL_TABLET | Freq: Two times a day (BID) | ORAL | 1 refills | Status: DC

## 2024-07-13 NOTE — Patient Instructions (Signed)
 Medication Instructions:  Your physician recommends the following medication changes.  STOP TAKING: Amlodipine   START TAKING: Metoprolol  Tartrate 25 mg twice daily  *If you need a refill on your cardiac medications before your next appointment, please call your pharmacy*  Lab Work: None ordered If you have labs (blood work) drawn today and your tests are completely normal, you will receive your results only by: MyChart Message (if you have MyChart) OR A paper copy in the mail If you have any lab test that is abnormal or we need to change your treatment, we will call you to review the results.  Testing/Procedures: Your physician has requested that you have an echocardiogram. Echocardiography is a painless test that uses sound waves to create images of your heart. It provides your doctor with information about the size and shape of your heart and how well your hearts chambers and valves are working.   You may receive an ultrasound enhancing agent through an IV if needed to better visualize your heart during the echo. This procedure takes approximately one hour.  There are no restrictions for this procedure.  This will take place at [address] [facility] [address], [place] [address]  Please note: We ask at that you not bring children with you during ultrasound (echo/ vascular) testing. Due to room size and safety concerns, children are not allowed in the ultrasound rooms during exams. Our front office staff cannot provide observation of children in our lobby area while testing is being conducted. An adult accompanying a patient to their appointment will only be allowed in the ultrasound room at the discretion of the ultrasound technician under special circumstances. We apologize for any inconvenience.   Follow-Up: At [facility], you and your health needs are our priority.  As part of our continuing mission to provide you with exceptional heart care, our  providers are all part of one team.  This team includes your primary Cardiologist (physician) and Advanced Practice Providers or APPs (Physician Assistants and Nurse Practitioners) who all work together to provide you with the care you need, when you need it.  Your next appointment:   1 month(s)  Provider:   You may see Deatrice Cage, MD or one of the following Advanced Practice Providers on your designated Care Team:   Lonni Meager, NP Lesley Maffucci, PA-C Bernardino Bring, PA-C Cadence Milton, PA-C Tylene Lunch, NP Barnie Hila, NP    We recommend signing up for the patient portal called MyChart.  Sign up information is provided on this After Visit Summary.  MyChart is used to connect with patients for Virtual Visits (Telemedicine).  Patients are able to view lab/test results, encounter notes, upcoming appointments, etc.  Non-urgent messages can be sent to your provider as well.   To learn more about what you can do with MyChart, go to [email or web address].

## 2024-07-13 NOTE — Progress Notes (Signed)
 "    Cardiology Office Note   Date:  07/13/2024   ID:  Gary Lynn, Gary Lynn 29-Aug-1930, MRN [id number]  PCP:  Carin Gauze, NP  Cardiologist:   Deatrice Cage, MD   Chief Complaint  Patient presents with   New Patient (Initial Visit)    CAD/Afib c/o loss of balance. Meds reviewed verbally with pt.      History of Present Illness: Gary Lynn is a 89 y.o. male who presents for a follow-up visit regarding coronary artery disease and paroxysmal atrial fibrillation. The patient has known history of coronary artery disease with previous stenting of the right coronary artery years ago. Most recent cardiac catheterization was done in February of 2013 which showed an occluded RCA with left to right collaterals, high-grade complex disease in the OM1 which is known to be chronic, normal ejection fraction. The patient was treated medically. He has known history of hypertension, diabetes, PVCs, PAD and hyperlipidemia as well as  carotid disease.  He had a TIA in 2019.  He was found to have significant carotid stenosis but declined surgery. He has history of paroxysmal atrial fibrillation and is on long-term anticoagulation with Xarelto .  He has not been seen in our office since 2020. He had recent worsening of shortness of breath.  He was treated for bronchitis.  He denies chest pain.  He has balance issues and uses a cane and a walker.  He is noted to be tachycardic today.  Past Medical History:  Diagnosis Date   Carotid artery stenosis    a. carotid doppler 05/2016: RICA 40-59%, LICA 1-39%   Chronic diastolic CHF (congestive heart failure) (HCC)    a. echo 05/2016: EF 55-60%, AK of the basal-midinferolateral and inferior myocardium (c/w prior study), GR1DD, aortic sclerosis without stenosis, mild MR, PASP 40 mmHg   Coronary artery disease    Cardiac cath in February of 2013: EF 60%, high-grade chronic disease in OM 1, occluded mid RCA at the site of a previously placed stent with  left-to-right collaterals, occluded left SFA.   Diabetes mellitus with complication (HCC)    Hypercholesterolemia    Hypertension    PAF (paroxysmal atrial fibrillation) (HCC)    a. Zio monitor 05/2016: predominant rhyhtm of sinus with intermittent episodes of Afib/flutter with the longest episode being 6 hours and 51 minutes with an average heart rate of 114 bpm. Occasional PACs and PVCs. b. CHADS2VASc --> 6 (CHF, HTN, age x 2, DM, vascular disease)    Past Surgical History:  Procedure Laterality Date   BACK SURGERY     years ago   CARDIAC CATHETERIZATION  07/2007   [facility]   CARDIAC CATHETERIZATION  07/2011   [facility]   CORONARY ANGIOPLASTY WITH STENT PLACEMENT       Current Outpatient Medications  Medication Sig Dispense Refill   albuterol  (VENTOLIN  HFA) 108 (90 Base) MCG/ACT inhaler Inhale 2 puffs into the lungs every 6 (six) hours as needed for wheezing or shortness of breath. 8 g 2   amLODipine  (NORVASC ) 5 MG tablet Take 1 tablet (5 mg total) by mouth daily. 90 tablet 0   atorvastatin  (LIPITOR ) 40 MG tablet Take 1 tablet (40 mg total) by mouth daily. 90 tablet 1   benzonatate  (TESSALON ) 200 MG capsule Take 1 capsule (200 mg total) by mouth 3 (three) times daily as needed. 20 capsule 0   Continuous Glucose Sensor (FREESTYLE LIBRE 2 SENSOR) MISC APPLY ONE SENSOR EVERY 14 DAYS FOR USE WITH READER TO MONITOR  BLOOD SUGAR VALUES 6 each 3   finasteride  (PROSCAR ) 5 MG tablet Take 1 tablet (5 mg total) by mouth daily. 90 tablet 3   fish oil-omega-3 fatty acids 1000 MG capsule Take 1 g by mouth 2 (two) times daily.      furosemide  (LASIX ) 20 MG tablet Take 1 tablet (20 mg total) by mouth daily. 30 tablet 0   glipiZIDE  (GLUCOTROL  XL) 5 MG 24 hr tablet Take 1 tablet (5 mg total) by mouth daily before supper. 90 tablet 1   lisinopril  (ZESTRIL ) 10 MG tablet TAKE 2 TABLETS BY MOUTH TWICE DAILY BEFORE A MEAL (Patient taking differently: Take 10 mg by mouth 2 (two) times daily.) 360 tablet 0   Magnesium   250 MG TABS Take 250 mg by mouth daily.     Rivaroxaban  (XARELTO ) 15 MG TABS tablet Take 1 tablet (15 mg total) by mouth daily with supper. 90 tablet 1   RYBELSUS  7 MG TABS Take 1 tablet (7 mg total) by mouth daily. 90 tablet 3   tamsulosin  (FLOMAX ) 0.4 MG CAPS capsule Take 1 capsule (0.4 mg total) by mouth daily. TAKE 1 CAPSULE(0.4 MG) BY MOUTH DAILY 90 capsule 3   triamcinolone  ointment (KENALOG ) 0.1 % Apply 1 Application topically 2 (two) times daily. 80 g 1   Vibegron  (GEMTESA ) 75 MG TABS Take 1 tablet by mouth once daily 30 tablet 3   cefpodoxime  (VANTIN ) 100 MG tablet Take 1 tablet (100 mg total) by mouth 2 (two) times daily. (Patient not taking: Reported on 07/13/2024) 14 tablet 0   triamcinolone  0.1%-Eucerin equivalent 1:1 cream mixture Apply 1 Application topically 2 (two) times daily. (Patient not taking: Reported on 07/13/2024) 160 g 1   triamcinolone  ointment (KENALOG ) 0.1 % Apply once-twice daily as needed for flares. Only use until resolved or up to two weeks out of a month. Avoid application to face armpits and groin (Patient not taking: Reported on 07/13/2024) 80 g 0   No current facility-administered medications for this visit.    Allergies:   Patient has no known allergies.    Social History:  The patient  reports that he has been smoking cigarettes. He has a 25 pack-year smoking history. He has been exposed to tobacco smoke. He has never used smokeless tobacco. He reports that he does not drink alcohol and does not use drugs.   Family History:  The patient's family history includes Dementia in his mother; Heart attack in his father.    ROS:  Please see the history of present illness.   Otherwise, review of systems are positive for none.   All other systems are reviewed and negative.    PHYSICAL EXAM: VS:  BP 108/64 (BP Location: Right Arm, Cuff Size: Normal)   Pulse (!) 119   Ht 5' 3 (1.6 m)   Wt 140 lb 6 oz (63.7 kg)   SpO2 93%   BMI 24.87 kg/m  , BMI Body mass index  is 24.87 kg/m. GEN: Well nourished, well developed, in no acute distress  HEENT: normal  Neck: no JVD, carotid bruits, or masses Cardiac: Irregularly irregular and tachycardic; no murmurs, rubs, or gallops, mild bilateral lower extremity edema Respiratory:  clear to auscultation bilaterally, normal work of breathing GI: soft, nontender, nondistended, + BS MS: no deformity or atrophy  Skin: warm and dry, no rash Neuro:  Strength and sensation are intact Psych: euthymic mood, full affect Right radial pulse is normal. Left radial pulse is diminished.   EKG:  EKG is  ordered today. The ekg ordered today demonstrates : Atrial flutter with 2:1 A-V conduction Left axis deviation Non-specific intra-ventricular conduction block Cannot rule out Anterior infarct , age undetermined Confirmed by Darron Carry ([id number]) on 07/13/2024 9:29:19 AM    Recent Labs: 08/19/2023: BUN 22; Creatinine, Ser 1.18; Hemoglobin 13.2; Platelets 180; Potassium 4.4; Sodium 136    Lipid Panel    Component Value Date/Time   CHOL 163 12/18/2017 1818   TRIG 158 (H) 12/18/2017 1818   HDL 31 (L) 12/18/2017 1818   CHOLHDL 5.3 12/18/2017 1818   VLDL 32 12/18/2017 1818   LDLCALC 100 (H) 12/18/2017 1818      Wt Readings from Last 3 Encounters:  07/13/24 140 lb 6 oz (63.7 kg)  07/04/24 140 lb (63.5 kg)  04/02/24 136 lb (61.7 kg)         ASSESSMENT AND PLAN:  1.  Atypical atrial flutter with RVR: History of paroxysmal atrial fibrillation.  Continue anticoagulation with renally adjusted Xarelto .  His ventricular rate is not controlled.  I elected to stop amlodipine  and start metoprolol  25 mg twice daily.  I discussed with the patient and family the option of proceeding with cardioversion if heart rate is not controlled with medications.  Overall, they prefer medications only considering his age and comorbidities. I requested an echocardiogram.  2.  Coronary artery disease involving native coronary arteries  without angina: He is doing reasonably well.  He is not on antiplatelet medication given that he is on anticoagulation.  3. Orthostatic hypotension: He reports some worsening recently and his symptoms likely related to tachycardia.  4.  Carotid disease: No plans for repeat imaging as he declined intervention on this in the past.  5.  Essential hypertension I made adjustments in his medications as outlined above to improve ventricular rate  6. Hyperlipidemia: Continue treatment with atorvastatin  with a target LDL of less than 70.     Disposition:   FU after echocardiogram.  Signed,  Deatrice Darron, MD  07/13/2024 9:31 AM    [facility] "

## 2024-07-16 ENCOUNTER — Emergency Department

## 2024-07-16 ENCOUNTER — Inpatient Hospital Stay

## 2024-07-16 ENCOUNTER — Other Ambulatory Visit: Payer: Self-pay

## 2024-07-16 ENCOUNTER — Inpatient Hospital Stay
Admission: EM | Admit: 2024-07-16 | Discharge: 2024-08-19 | DRG: 064 | Disposition: E | Attending: Internal Medicine | Admitting: Internal Medicine

## 2024-07-16 DIAGNOSIS — Z8249 Family history of ischemic heart disease and other diseases of the circulatory system: Secondary | ICD-10-CM

## 2024-07-16 DIAGNOSIS — H9190 Unspecified hearing loss, unspecified ear: Secondary | ICD-10-CM | POA: Diagnosis present

## 2024-07-16 DIAGNOSIS — F17210 Nicotine dependence, cigarettes, uncomplicated: Secondary | ICD-10-CM | POA: Diagnosis present

## 2024-07-16 DIAGNOSIS — Z7984 Long term (current) use of oral hypoglycemic drugs: Secondary | ICD-10-CM

## 2024-07-16 DIAGNOSIS — Z7901 Long term (current) use of anticoagulants: Secondary | ICD-10-CM

## 2024-07-16 DIAGNOSIS — I482 Chronic atrial fibrillation, unspecified: Secondary | ICD-10-CM | POA: Diagnosis present

## 2024-07-16 DIAGNOSIS — J90 Pleural effusion, not elsewhere classified: Secondary | ICD-10-CM

## 2024-07-16 DIAGNOSIS — I4510 Unspecified right bundle-branch block: Secondary | ICD-10-CM | POA: Diagnosis present

## 2024-07-16 DIAGNOSIS — Z515 Encounter for palliative care: Secondary | ICD-10-CM

## 2024-07-16 DIAGNOSIS — R5381 Other malaise: Secondary | ICD-10-CM | POA: Diagnosis present

## 2024-07-16 DIAGNOSIS — N179 Acute kidney failure, unspecified: Secondary | ICD-10-CM | POA: Diagnosis present

## 2024-07-16 DIAGNOSIS — L8990 Pressure ulcer of unspecified site, unspecified stage: Secondary | ICD-10-CM | POA: Insufficient documentation

## 2024-07-16 DIAGNOSIS — R531 Weakness: Principal | ICD-10-CM

## 2024-07-16 DIAGNOSIS — Z9889 Other specified postprocedural states: Secondary | ICD-10-CM

## 2024-07-16 DIAGNOSIS — Z923 Personal history of irradiation: Secondary | ICD-10-CM

## 2024-07-16 DIAGNOSIS — I5033 Acute on chronic diastolic (congestive) heart failure: Secondary | ICD-10-CM | POA: Diagnosis present

## 2024-07-16 DIAGNOSIS — E1151 Type 2 diabetes mellitus with diabetic peripheral angiopathy without gangrene: Secondary | ICD-10-CM | POA: Diagnosis present

## 2024-07-16 DIAGNOSIS — Z85828 Personal history of other malignant neoplasm of skin: Secondary | ICD-10-CM

## 2024-07-16 DIAGNOSIS — I6389 Other cerebral infarction: Principal | ICD-10-CM | POA: Diagnosis present

## 2024-07-16 DIAGNOSIS — I420 Dilated cardiomyopathy: Secondary | ICD-10-CM | POA: Diagnosis present

## 2024-07-16 DIAGNOSIS — I480 Paroxysmal atrial fibrillation: Secondary | ICD-10-CM | POA: Diagnosis present

## 2024-07-16 DIAGNOSIS — A419 Sepsis, unspecified organism: Secondary | ICD-10-CM

## 2024-07-16 DIAGNOSIS — I4891 Unspecified atrial fibrillation: Secondary | ICD-10-CM

## 2024-07-16 DIAGNOSIS — I110 Hypertensive heart disease with heart failure: Secondary | ICD-10-CM | POA: Diagnosis present

## 2024-07-16 DIAGNOSIS — R54 Age-related physical debility: Secondary | ICD-10-CM | POA: Diagnosis present

## 2024-07-16 DIAGNOSIS — J189 Pneumonia, unspecified organism: Secondary | ICD-10-CM | POA: Diagnosis not present

## 2024-07-16 DIAGNOSIS — I440 Atrioventricular block, first degree: Secondary | ICD-10-CM | POA: Diagnosis present

## 2024-07-16 DIAGNOSIS — E1165 Type 2 diabetes mellitus with hyperglycemia: Secondary | ICD-10-CM | POA: Diagnosis present

## 2024-07-16 DIAGNOSIS — I2510 Atherosclerotic heart disease of native coronary artery without angina pectoris: Secondary | ICD-10-CM | POA: Diagnosis present

## 2024-07-16 DIAGNOSIS — R296 Repeated falls: Secondary | ICD-10-CM | POA: Diagnosis present

## 2024-07-16 DIAGNOSIS — I2489 Other forms of acute ischemic heart disease: Secondary | ICD-10-CM | POA: Diagnosis present

## 2024-07-16 DIAGNOSIS — I639 Cerebral infarction, unspecified: Secondary | ICD-10-CM | POA: Diagnosis present

## 2024-07-16 DIAGNOSIS — Z66 Do not resuscitate: Secondary | ICD-10-CM | POA: Diagnosis present

## 2024-07-16 DIAGNOSIS — Z79899 Other long term (current) drug therapy: Secondary | ICD-10-CM

## 2024-07-16 DIAGNOSIS — I350 Nonrheumatic aortic (valve) stenosis: Secondary | ICD-10-CM | POA: Diagnosis present

## 2024-07-16 DIAGNOSIS — G9341 Metabolic encephalopathy: Secondary | ICD-10-CM | POA: Diagnosis present

## 2024-07-16 DIAGNOSIS — K921 Melena: Secondary | ICD-10-CM | POA: Diagnosis not present

## 2024-07-16 DIAGNOSIS — R4781 Slurred speech: Secondary | ICD-10-CM | POA: Diagnosis present

## 2024-07-16 DIAGNOSIS — Z955 Presence of coronary angioplasty implant and graft: Secondary | ICD-10-CM

## 2024-07-16 DIAGNOSIS — J9601 Acute respiratory failure with hypoxia: Secondary | ICD-10-CM | POA: Diagnosis present

## 2024-07-16 DIAGNOSIS — R6520 Severe sepsis without septic shock: Secondary | ICD-10-CM | POA: Diagnosis present

## 2024-07-16 DIAGNOSIS — E7800 Pure hypercholesterolemia, unspecified: Secondary | ICD-10-CM | POA: Diagnosis present

## 2024-07-16 LAB — TROPONIN T, HIGH SENSITIVITY
Troponin T High Sensitivity: 68 ng/L — ABNORMAL HIGH (ref 0–19)
Troponin T High Sensitivity: 56 ng/L — ABNORMAL HIGH (ref 0–19)

## 2024-07-16 LAB — HEPATIC FUNCTION PANEL
ALT: 40 U/L (ref 0–44)
AST: 21 U/L (ref 15–41)
Albumin: 3.4 g/dL — ABNORMAL LOW (ref 3.5–5.0)
Alkaline Phosphatase: 117 U/L (ref 38–126)
Bilirubin, Direct: 0.2 mg/dL (ref 0.0–0.2)
Indirect Bilirubin: 0.2 mg/dL — ABNORMAL LOW (ref 0.3–0.9)
Total Bilirubin: 0.3 mg/dL (ref 0.0–1.2)
Total Protein: 6.9 g/dL (ref 6.5–8.1)

## 2024-07-16 LAB — CBC
HCT: 40.5 % (ref 39.0–52.0)
Hemoglobin: 12.3 g/dL — ABNORMAL LOW (ref 13.0–17.0)
MCH: 28.8 pg (ref 26.0–34.0)
MCHC: 30.4 g/dL (ref 30.0–36.0)
MCV: 94.8 fL (ref 80.0–100.0)
Platelets: 209 10*3/uL (ref 150–400)
RBC: 4.27 MIL/uL (ref 4.22–5.81)
RDW: 16.3 % — ABNORMAL HIGH (ref 11.5–15.5)
WBC: 11.7 10*3/uL — ABNORMAL HIGH (ref 4.0–10.5)
nRBC: 0 % (ref 0.0–0.2)

## 2024-07-16 LAB — BASIC METABOLIC PANEL WITH GFR
Anion gap: 14 (ref 5–15)
BUN: 66 mg/dL — ABNORMAL HIGH (ref 8–23)
CO2: 23 mmol/L (ref 22–32)
Calcium: 9.2 mg/dL (ref 8.9–10.3)
Chloride: 104 mmol/L (ref 98–111)
Creatinine, Ser: 1.97 mg/dL — ABNORMAL HIGH (ref 0.61–1.24)
GFR, Estimated: 31 mL/min — ABNORMAL LOW
Glucose, Bld: 370 mg/dL — ABNORMAL HIGH (ref 70–99)
Potassium: 4.4 mmol/L (ref 3.5–5.1)
Sodium: 140 mmol/L (ref 135–145)

## 2024-07-16 LAB — PROTIME-INR
INR: 1.6 — ABNORMAL HIGH (ref 0.8–1.2)
Prothrombin Time: 19.9 s — ABNORMAL HIGH (ref 11.4–15.2)

## 2024-07-16 LAB — GLUCOSE, CAPILLARY: Glucose-Capillary: 279 mg/dL — ABNORMAL HIGH (ref 70–99)

## 2024-07-16 LAB — PRO BRAIN NATRIURETIC PEPTIDE: Pro Brain Natriuretic Peptide: 29741 pg/mL — ABNORMAL HIGH

## 2024-07-16 LAB — AMMONIA: Ammonia: <13 umol/L (ref 9–35)

## 2024-07-16 MED ORDER — ASPIRIN 325 MG PO TABS
325.0000 mg | ORAL_TABLET | Freq: Once | ORAL | Status: AC
  Administered 2024-07-16: 325 mg via ORAL
  Filled 2024-07-16: qty 1

## 2024-07-16 MED ORDER — LACTATED RINGERS IV SOLN: INTRAVENOUS | Status: DC

## 2024-07-16 MED ORDER — SODIUM CHLORIDE 0.9 % IV SOLN: 1.0000 g | INTRAVENOUS | Status: DC

## 2024-07-16 MED ORDER — SODIUM CHLORIDE 0.9 % IV SOLN
100.0000 mg | Freq: Once | INTRAVENOUS | Status: DC
  Filled 2024-07-16: qty 100

## 2024-07-16 MED ORDER — STROKE: EARLY STAGES OF RECOVERY BOOK: Freq: Once | Status: DC

## 2024-07-16 MED ORDER — INSULIN ASPART 100 UNIT/ML IJ SOLN
0.0000 [IU] | Freq: Every day | INTRAMUSCULAR | Status: DC
  Administered 2024-07-16: 3 [IU] via SUBCUTANEOUS
  Administered 2024-07-18: 4 [IU] via SUBCUTANEOUS
  Filled 2024-07-16: qty 4
  Filled 2024-07-16: qty 3

## 2024-07-16 MED ORDER — ACETAMINOPHEN 650 MG RE SUPP: 650.0000 mg | Freq: Four times a day (QID) | RECTAL | Status: DC | PRN

## 2024-07-16 MED ORDER — INSULIN ASPART 100 UNIT/ML IJ SOLN
0.0000 [IU] | Freq: Three times a day (TID) | INTRAMUSCULAR | Status: DC
  Administered 2024-07-17 (×3): 3 [IU] via SUBCUTANEOUS
  Administered 2024-07-18: 15 [IU] via SUBCUTANEOUS
  Administered 2024-07-18 (×2): 3 [IU] via SUBCUTANEOUS
  Administered 2024-07-19: 5 [IU] via SUBCUTANEOUS
  Administered 2024-07-19: 11 [IU] via SUBCUTANEOUS
  Administered 2024-07-19: 15 [IU] via SUBCUTANEOUS
  Administered 2024-07-20 (×3): 5 [IU] via SUBCUTANEOUS
  Administered 2024-07-21: 8 [IU] via SUBCUTANEOUS
  Administered 2024-07-21 – 2024-07-22 (×3): 5 [IU] via SUBCUTANEOUS
  Filled 2024-07-16: qty 5
  Filled 2024-07-16: qty 8
  Filled 2024-07-16 (×2): qty 5
  Filled 2024-07-16: qty 15
  Filled 2024-07-16 (×3): qty 5
  Filled 2024-07-16: qty 15
  Filled 2024-07-16: qty 3
  Filled 2024-07-16: qty 15
  Filled 2024-07-16: qty 3
  Filled 2024-07-16: qty 5
  Filled 2024-07-16: qty 3

## 2024-07-16 MED ORDER — ATORVASTATIN CALCIUM 20 MG PO TABS
40.0000 mg | ORAL_TABLET | Freq: Every day | ORAL | Status: DC
  Administered 2024-07-16 – 2024-07-21 (×5): 40 mg via ORAL
  Filled 2024-07-16 (×6): qty 2

## 2024-07-16 MED ORDER — ONDANSETRON HCL 4 MG/2ML IJ SOLN: 4.0000 mg | Freq: Four times a day (QID) | INTRAMUSCULAR | Status: DC | PRN

## 2024-07-16 MED ORDER — LACTATED RINGERS IV BOLUS
1000.0000 mL | Freq: Once | INTRAVENOUS | Status: AC
  Administered 2024-07-16: 1000 mL via INTRAVENOUS

## 2024-07-16 MED ORDER — IPRATROPIUM-ALBUTEROL 0.5-2.5 (3) MG/3ML IN SOLN
3.0000 mL | Freq: Four times a day (QID) | RESPIRATORY_TRACT | Status: DC
  Administered 2024-07-16 – 2024-07-17 (×5): 3 mL via RESPIRATORY_TRACT
  Filled 2024-07-16 (×5): qty 3

## 2024-07-16 MED ORDER — NICOTINE 7 MG/24HR TD PT24
7.0000 mg | MEDICATED_PATCH | Freq: Every day | TRANSDERMAL | Status: DC
  Administered 2024-07-16 – 2024-07-22 (×7): 7 mg via TRANSDERMAL
  Filled 2024-07-16 (×7): qty 1

## 2024-07-16 MED ORDER — HYDRALAZINE HCL 20 MG/ML IJ SOLN: 10.0000 mg | Freq: Four times a day (QID) | INTRAMUSCULAR | Status: DC | PRN

## 2024-07-16 MED ORDER — SODIUM CHLORIDE 0.9 % IV SOLN
100.0000 mg | Freq: Two times a day (BID) | INTRAVENOUS | Status: DC
  Administered 2024-07-16 – 2024-07-17 (×2): 100 mg via INTRAVENOUS
  Filled 2024-07-16 (×4): qty 100

## 2024-07-16 MED ORDER — SODIUM CHLORIDE 0.9 % IV SOLN
1.0000 g | Freq: Once | INTRAVENOUS | Status: AC
  Administered 2024-07-16: 1 g via INTRAVENOUS
  Filled 2024-07-16: qty 10

## 2024-07-16 MED ORDER — ACETAMINOPHEN 325 MG PO TABS: 650.0000 mg | ORAL_TABLET | Freq: Four times a day (QID) | ORAL | Status: DC | PRN

## 2024-07-16 MED ORDER — GUAIFENESIN ER 600 MG PO TB12
600.0000 mg | ORAL_TABLET | Freq: Two times a day (BID) | ORAL | Status: DC
  Administered 2024-07-16 – 2024-07-22 (×11): 600 mg via ORAL
  Filled 2024-07-16 (×11): qty 1

## 2024-07-16 MED ORDER — RIVAROXABAN 15 MG PO TABS
15.0000 mg | ORAL_TABLET | Freq: Every day | ORAL | Status: DC
  Administered 2024-07-16: 15 mg via ORAL
  Filled 2024-07-16: qty 1

## 2024-07-16 MED ORDER — ONDANSETRON HCL 4 MG PO TABS: 4.0000 mg | ORAL_TABLET | Freq: Four times a day (QID) | ORAL | Status: DC | PRN

## 2024-07-16 MED ORDER — SODIUM CHLORIDE 0.9 % IV SOLN: 500.0000 mg | Freq: Once | INTRAVENOUS | Status: DC

## 2024-07-16 NOTE — ED Notes (Signed)
 Blood work + 2nd set of blood cultures unable to be collected. RN called Lab to request blood draw.

## 2024-07-16 NOTE — ED Provider Notes (Signed)
 "  [facility] Provider Note    Event Date/Time   First MD Initiated Contact with Patient 07/16/24 1323     (approximate)   History   Weakness   HPI  Gary Lynn is a 89 y.o. male who presents to the emergency department today because of concerns for weakness and altered mental status.  History is primarily obtained from family at bedside.  They state over the past 2 weeks they have noticed the patient has been acting more altered.  They state it is almost as if he has been having hallucinations.  Additionally he has been having a harder time ambulating.  He does have some balance issues at baseline but it has been more severe.  This morning the patient woke up screaming.  When family asked if he was in pain he denied any however that was what caused them to call EMS.     Physical Exam   Triage Vital Signs: ED Triage Vitals  Encounter Vitals Group     BP 07/16/24 1255 132/72     Girls Systolic BP Percentile --      Girls Diastolic BP Percentile --      Boys Systolic BP Percentile --      Boys Diastolic BP Percentile --      Pulse Rate 07/16/24 1255 73     Resp 07/16/24 1255 17     Temp 07/16/24 1255 97.7 F (36.5 C)     Temp src --      SpO2 07/16/24 1255 97 %     Weight 07/16/24 1300 140 lb 3.4 oz (63.6 kg)     Height 07/16/24 1300 5' 3 (1.6 m)     Head Circumference --      Peak Flow --      Pain Score 07/16/24 1300 0     Pain Loc --      Pain Education --      Exclude from Growth Chart --     Most recent vital signs: Vitals:   07/16/24 1255  BP: 132/72  Pulse: 73  Resp: 17  Temp: 97.7 F (36.5 C)  SpO2: 97%   General: Awake, alert, oriented. CV:  Good peripheral perfusion. Regular rate and rhythm. Resp:  Normal effort. Lungs clear. Abd:  No distention.   ED Results / Procedures / Treatments   Labs (all labs ordered are listed, but only abnormal results are displayed) Labs Reviewed  BASIC METABOLIC PANEL WITH GFR -  Abnormal; Notable for the following components:      Result Value   Glucose, Bld 370 (*)    BUN 66 (*)    Creatinine, Ser 1.97 (*)    GFR, Estimated 31 (*)    All other components within normal limits  CBC - Abnormal; Notable for the following components:   WBC 11.7 (*)    Hemoglobin 12.3 (*)    RDW 16.3 (*)    All other components within normal limits  PROTIME-INR - Abnormal; Notable for the following components:   Prothrombin Time 19.9 (*)    INR 1.6 (*)    All other components within normal limits  TROPONIN T, HIGH SENSITIVITY - Abnormal; Notable for the following components:   Troponin T High Sensitivity 68 (*)    All other components within normal limits  CULTURE, BLOOD (ROUTINE X 2)  CULTURE, BLOOD (ROUTINE X 2)  LACTIC ACID, PLASMA  TROPONIN T, HIGH SENSITIVITY     EKG  I, Tiffney Haughton  Floy, attending physician, personally viewed and interpreted this EKG  EKG Time: 1301 Rate: 73 Rhythm: sinus rhythm with 1st degree av block Axis: left axis deviation Intervals: qtc 500 QRS: RBBB ST changes: no st elevation Impression: abnormal ekg    RADIOLOGY I independently interpreted and visualized the CXR. My interpretation: bilateral opacities Radiology interpretation:  IMPRESSION:  1. Progressive right upper lobe consolidation and increased bilateral  interstitial and alveolar opacities.  2. Small bilateral pleural effusions, increased from previous exam.   I independently interpreted and visualized the CT head. My interpretation: No ICH Radiology interpretation:  IMPRESSION:  1. No acute intracranial abnormality.  2. No substantial change since 12/18/2017.       PROCEDURES:  Critical Care performed: Yes  CRITICAL CARE Performed by: Guadalupe Floy   Total critical care time: 30 minutes  Critical care time was exclusive of separately billable procedures and treating other patients.  Critical care was necessary to treat or prevent imminent or  life-threatening deterioration.  Critical care was time spent personally by me on the following activities: development of treatment plan with patient and/or surrogate as well as nursing, discussions with consultants, evaluation of patient's response to treatment, examination of patient, obtaining history from patient or surrogate, ordering and performing treatments and interventions, ordering and review of laboratory studies, ordering and review of radiographic studies, pulse oximetry and re-evaluation of patient's condition.   Procedures    MEDICATIONS ORDERED IN ED: Medications - No data to display   IMPRESSION / MDM / ASSESSMENT AND PLAN / ED COURSE  I reviewed the triage vital signs and the nursing notes.                              Differential diagnosis includes, but is not limited to, infection, ICH, dehydration, electrolyte abnormality  Patient's presentation is most consistent with acute presentation with potential threat to life or bodily function.   The patient is on the cardiac monitor to evaluate for evidence of arrhythmia and/or significant heart rate changes.  Patient presented to the emergency department today because of concerns for altered mental status and weakness.  On exam patient is awake alert and appears to be oriented.  Patient is afebrile here.  Workup is concerning for possible pneumonia.  Chest x-ray shows worsening bilateral opacities.  Patient was started on broad spectrum antibiotics. Head CT without concerning abnormality. At this time do have concern that the pneumonia is the cause of the patient's symptoms. Will plan on admission for further work up and evaluation.  FINAL CLINICAL IMPRESSION(S) / ED DIAGNOSES   Final diagnoses:  Weakness  AKI (acute kidney injury)  Pneumonia of both lungs due to infectious organism, unspecified part of lung     Note:  This document was prepared using Dragon voice recognition software and may include unintentional  dictation errors.    Floy Guadalupe, MD 07/16/24 1600  "

## 2024-07-16 NOTE — H&P (Addendum)
 " History and Physical    Patient: Gary Lynn FMW:[id number] DOB: 05-14-1931 DOA: 07/16/2024 DOS: the patient was seen and examined on 07/16/2024 PCP: Carin Gauze, NP  Patient coming from: Home  Chief Complaint:  Chief Complaint  Patient presents with   Weakness   HPI: Gary Lynn is a 89 y.o. male with medical history significant of CAD s/p stenting, paroxysmal afib on xarelto , skin cancer status post radiation, T2DM presents to the emergency department for evaluation of altered mental status.  He is accompanied by his wife and another family member who provided majority of the history due to patient being hard of hearing. Per his wife, he has been restless, unable to sleep, possibly having hallucinations for the past few days.  This morning he was yelling in pain, however when his wife asked him what is wrong, he said he did not know, so she called 911.   A few weeks ago, he saw his primary care provider for a cough and was prescribed azithromycin , Lasix , and Tessalon  Perles. Cough has improved somewhat but persistent.  He finished the azithromycin  a few days ago.   Family states that since he has been taking these new medications, they have noted increased confusion, left sided weakness, increased falls,  slurred speech, yellowing of his skin, and swelling in his lower extremities.They also note he recently finished radiation therapy from skin cancer.  He has had no fevers, nausea, vomiting, diarrhea.   Smoked for at least 80 years.   In the ED he was hemodynamically stable, saturating 90% on room air and placed on 3L [place]. Wbc 11.7.  AKI with BUN 66 and Cr 1.97. UA not yet collected. No LFT's  CT without acute intracranial abnormality  HS troponin 68  CXR with progressive right upper lobe consolidation and increased bilateral interstitial and alveolar opacities.  Discussed code status and patient wishes to be full code.   Hospitalist consulted for ongoing management.    Review of Systems: Review of Systems  Constitutional:  Positive for malaise/fatigue and weight loss. Negative for chills and fever.  Respiratory:  Positive for cough and shortness of breath.   Cardiovascular:  Positive for claudication and leg swelling. Negative for chest pain and palpitations.  Gastrointestinal:  Negative for abdominal pain, diarrhea, nausea and vomiting.  Musculoskeletal:  Positive for falls.  Neurological:  Positive for speech change.  Psychiatric/Behavioral:  Positive for substance abuse.     Past Medical History:  Diagnosis Date   Carotid artery stenosis    a. carotid doppler 05/2016: RICA 40-59%, LICA 1-39%   Chronic diastolic CHF (congestive heart failure) (HCC)    a. echo 05/2016: EF 55-60%, AK of the basal-midinferolateral and inferior myocardium (c/w prior study), GR1DD, aortic sclerosis without stenosis, mild MR, PASP 40 mmHg   Coronary artery disease    Cardiac cath in February of 2013: EF 60%, high-grade chronic disease in OM 1, occluded mid RCA at the site of a previously placed stent with left-to-right collaterals, occluded left SFA.   Diabetes mellitus with complication (HCC)    Hypercholesterolemia    Hypertension    PAF (paroxysmal atrial fibrillation) (HCC)    a. Zio monitor 05/2016: predominant rhyhtm of sinus with intermittent episodes of Afib/flutter with the longest episode being 6 hours and 51 minutes with an average heart rate of 114 bpm. Occasional PACs and PVCs. b. CHADS2VASc --> 6 (CHF, HTN, age x 2, DM, vascular disease)   Past Surgical History:  Procedure Laterality Date  BACK SURGERY     years ago   CARDIAC CATHETERIZATION  07/2007   [facility]   CARDIAC CATHETERIZATION  07/2011   [facility]   CORONARY ANGIOPLASTY WITH STENT PLACEMENT     Social History:  reports that he has been smoking cigarettes. He has a 25 pack-year smoking history. He has been exposed to tobacco smoke. He has never used smokeless tobacco. He reports that he does not drink  alcohol and does not use drugs.  Allergies[1]  Family History  Problem Relation Age of Onset   Dementia Mother    Heart attack Father    Kidney disease Neg Hx    Prostate cancer Neg Hx     Prior to Admission medications  Medication Sig Start Date End Date Taking? Authorizing Provider  albuterol  (VENTOLIN  HFA) 108 (90 Base) MCG/ACT inhaler Inhale 2 puffs into the lungs every 6 (six) hours as needed for wheezing or shortness of breath. 07/04/24   Scoggins, Amber, NP  atorvastatin  (LIPITOR ) 40 MG tablet Take 1 tablet (40 mg total) by mouth daily. 03/29/24   Scoggins, Amber, NP  benzonatate  (TESSALON ) 200 MG capsule Take 1 capsule (200 mg total) by mouth 3 (three) times daily as needed. 07/04/24   Scoggins, Amber, NP  cefpodoxime  (VANTIN ) 100 MG tablet Take 1 tablet (100 mg total) by mouth 2 (two) times daily. Patient not taking: Reported on 07/13/2024 09/26/23   Helon Kirsch A, PA-C  Continuous Glucose Sensor (FREESTYLE LIBRE 2 SENSOR) MISC APPLY ONE SENSOR EVERY 14 DAYS FOR USE WITH READER TO MONITOR BLOOD SUGAR VALUES 06/28/24   Scoggins, Amber, NP  finasteride  (PROSCAR ) 5 MG tablet Take 1 tablet (5 mg total) by mouth daily. 12/29/23   Helon Kirsch A, PA-C  fish oil-omega-3 fatty acids 1000 MG capsule Take 1 g by mouth 2 (two) times daily.     [provider]  furosemide  (LASIX ) 20 MG tablet Take 1 tablet (20 mg total) by mouth daily. 07/05/24 07/05/25  Scoggins, Amber, NP  glipiZIDE  (GLUCOTROL  XL) 5 MG 24 hr tablet Take 1 tablet (5 mg total) by mouth daily before supper. 05/15/24   Scoggins, [facility], NP  lisinopril  (ZESTRIL ) 10 MG tablet TAKE 2 TABLETS BY MOUTH TWICE DAILY BEFORE A MEAL Patient taking differently: Take 10 mg by mouth 2 (two) times daily. 06/28/24   Scoggins, Amber, NP  Magnesium  250 MG TABS Take 250 mg by mouth daily.    [provider]  metoprolol  tartrate (LOPRESSOR ) 25 MG tablet Take 1 tablet (25 mg total) by mouth 2 (two) times daily. 07/13/24 10/11/24  Darron Deatrice LABOR, MD  Rivaroxaban  (XARELTO ) 15 MG TABS tablet Take 1 tablet (15 mg total) by mouth daily with supper. 03/29/24   Scoggins, Amber, NP  RYBELSUS  7 MG TABS Take 1 tablet (7 mg total) by mouth daily. 05/07/24   Scoggins, Amber, NP  tamsulosin  (FLOMAX ) 0.4 MG CAPS capsule Take 1 capsule (0.4 mg total) by mouth daily. TAKE 1 CAPSULE(0.4 MG) BY MOUTH DAILY 12/29/23   Helon Kirsch A, PA-C  triamcinolone  0.1%-Eucerin equivalent 1:1 cream mixture Apply 1 Application topically 2 (two) times daily. Patient not taking: Reported on 07/13/2024 05/10/24   Scoggins, [facility], NP  triamcinolone  ointment (KENALOG ) 0.1 % Apply 1 Application topically 2 (two) times daily. 08/19/23   Menshew, Candida LULLA Kings, PA-C  triamcinolone  ointment (KENALOG ) 0.1 % Apply once-twice daily as needed for flares. Only use until resolved or up to two weeks out of a month. Avoid application to face armpits  and groin Patient not taking: Reported on 07/13/2024 05/23/24     Vibegron  (GEMTESA ) 75 MG TABS Take 1 tablet by mouth once daily 04/16/24   Helon Clotilda LABOR, PA-C    Physical Exam: Vitals:   07/16/24 1347 07/16/24 1354 07/16/24 1645 07/16/24 1802  BP: (!) 141/78   (!) 113/97  Pulse: 88  80   Resp:   (!) 26   Temp:      SpO2: 90%  98%   Weight:  63 kg    Height:       Physical Exam Vitals and nursing note reviewed.  Constitutional:      General: He is not in acute distress.    Appearance: He is ill-appearing (chronically).  HENT:     Head: Normocephalic and atraumatic.     Mouth/Throat:     Mouth: Mucous membranes are moist.  Eyes:     Extraocular Movements: Extraocular movements intact.     Pupils: Pupils are equal, round, and reactive to light.  Cardiovascular:     Rate and Rhythm: Normal rate. Rhythm irregular.  Pulmonary:     Effort: Pulmonary effort is normal. No respiratory distress.     Breath sounds: Rhonchi present. No wheezing.  Abdominal:     General: There is no distension.     Palpations:  Abdomen is soft.     Tenderness: There is no abdominal tenderness.  Musculoskeletal:     Cervical back: Neck supple.     Right lower leg: Edema present.     Left lower leg: Edema present.  Skin:    General: Skin is warm and dry.     Capillary Refill: Capillary refill takes less than 2 seconds.  Neurological:     Mental Status: He is alert. Mental status is at baseline.     Comments: Strength and sensation intact b/l upper and lower extremities  Slurred speech   Psychiatric:        Mood and Affect: Mood normal.        Behavior: Behavior normal.     Data Reviewed:   Labs on Admission: I have personally reviewed following labs and imaging studies  CBC: Recent Labs  Lab 07/16/24 1339  WBC 11.7*  HGB 12.3*  HCT 40.5  MCV 94.8  PLT 209   Basic Metabolic Panel: Recent Labs  Lab 07/16/24 1339  NA 140  K 4.4  CL 104  CO2 23  GLUCOSE 370*  BUN 66*  CREATININE 1.97*  CALCIUM  9.2   GFR: Estimated Creatinine Clearance: 19.7 mL/min (A) (by C-G formula based on SCr of 1.97 mg/dL (H)). Liver Function Tests: No results for input(s): AST, ALT, ALKPHOS, BILITOT, PROT, ALBUMIN in the last 168 hours. No results for input(s): LIPASE, AMYLASE in the last 168 hours. No results for input(s): AMMONIA in the last 168 hours. Coagulation Profile: Recent Labs  Lab 07/16/24 1339  INR 1.6*   Cardiac Enzymes: No results for input(s): CKTOTAL, CKMB, CKMBINDEX, TROPONINI in the last 168 hours. BNP (last 3 results) Recent Labs    07/16/24 1339  PROBNP 29,741.0*   HbA1C: No results for input(s): HGBA1C in the last 72 hours. CBG: No results for input(s): GLUCAP in the last 168 hours. Lipid Profile: No results for input(s): CHOL, HDL, LDLCALC, TRIG, CHOLHDL, LDLDIRECT in the last 72 hours. Thyroid  Function Tests: No results for input(s): TSH, T4TOTAL, FREET4, T3FREE, THYROIDAB in the last 72 hours. Anemia Panel: No results for  input(s): VITAMINB12, FOLATE, FERRITIN, TIBC, IRON, RETICCTPCT in  the last 72 hours. Urine analysis:    Component Value Date/Time   APPEARANCEUR Cloudy (A) 02/09/2024 0904   GLUCOSEU Negative 02/09/2024 0904   BILIRUBINUR Negative 02/09/2024 0904   PROTEINUR 2+ (A) 02/09/2024 0904   NITRITE Negative 02/09/2024 0904   LEUKOCYTESUR 2+ (A) 02/09/2024 0904    Radiological Exams on Admission: CT Head Wo Contrast Result Date: 07/16/2024 EXAM: CT HEAD WITHOUT CONTRAST 07/16/2024 02:30:54 PM TECHNIQUE: CT of the head was performed without the administration of intravenous contrast. Automated exposure control, iterative reconstruction, and/or weight based adjustment of the mA/kV was utilized to reduce the radiation dose to as low as reasonably achievable. COMPARISON: CT head 12/18/2017 and MRI brain 12/18/2017. CLINICAL HISTORY: 89 year old male with a week ago started new medications and was slurring his words since per family. Reports new left leg weakness. FINDINGS: BRAIN AND VENTRICLES: No acute hemorrhage. Redemonstrated bilateral chronic lacunar infarctions at the basal ganglia. No acute territorial infarction. There is overall similar mild generalized parenchymal volume loss. There is overall similar moderate scattered white matter hypodensities which are nonspecific but most commonly represent chronic microvascular ischemic changes. No hydrocephalus. No extra-axial collection. No mass effect or midline shift. ORBITS: Bilateral lens replacement. SINUSES: No acute abnormality. SOFT TISSUES AND SKULL: There is calcification of the small scalp vessels which can be seen in the setting of end-stage renal disease. No acute soft tissue abnormality. No skull fracture. VASCULATURE: Bilateral carotid calcifications. IMPRESSION: 1. No acute intracranial abnormality. 2. No substantial change since 12/18/2017. Electronically signed by: Prentice Spade MD 07/16/2024 03:31 PM EST RP Workstation: [id number]    DG Chest 2 View Result Date: 07/16/2024 EXAM: 2 VIEW(S) XRAY OF THE CHEST 07/16/2024 01:05:00 PM COMPARISON: 07/04/2024 CLINICAL HISTORY: Weakness. FINDINGS: LUNGS AND PLEURA: Low lung volumes. Worsening aeration to the right upper lobe. Progressive bilateral interstitial and alveolar opacities. Small bilateral pleural effusions, increased from prior study. . No pneumothorax. HEART AND MEDIASTINUM: Aortic atherosclerosis. No acute abnormality of the cardiac and mediastinal silhouettes. BONES AND SOFT TISSUES: Stable sclerotic lesion in right humeral head. IMPRESSION: 1. Progressive right upper lobe consolidation and increased bilateral interstitial and alveolar opacities. 2. Small bilateral pleural effusions, increased from previous exam. Electronically signed by: Waddell Calk MD 07/16/2024 01:28 PM EST RP Workstation: [id number]       Assessment and Plan:  AMS  Falls  Slurred speech  - wife describes delirium, at the time of my encounter at baseline mental status.  slurred speech noted in the past few weeks with increasing falls and weakness, concerning for subacute CVA  CT head unremarkable.  - MRI brain - PT/OT/SLP - fall and delerium precautions   Acute hypoxic respiratory failure  - with increasing b/l pleural effusions and progression of RUL consolidation  - empiric rocephin /doxy   - f/u CT chest, significant smoking history  - nebs, mucolytics  - ECHO  - wean O2 as tolerated   AKI  - BUN 66, Cr 1.97. Baseline Cr around 1, with peripheral edema and pleural effusions despite recently starting lasix  20mg  daily (per wife)  - review home mediations when reconciled.  hold nephrotoxic agents  - check bladder scan, f/u UA  - gentle IVF for now  - monitor chemistries, I/O   Elevated troponin - no CP. Suspect demand  - trend trop, monitor on telemetry  - ECHO   CAD s/p stenting  Paroxysmal afib - continue home mediations when reconciled   HTN  - resume home medications as  appropriate  - PRN hydralazine   T2DM  - SSI   Tobacco abuse  - nicotine  patch   LR @50  Consistent carb diet  Monitor/replace electrolytes  Resume anticoagulation when reconciled    Advance Care Planning:   Code Status: Full Code discussed with patient at the time of admission      Family Communication: wife and daughter present at bedside   Severity of Illness: The appropriate patient status for this patient is INPATIENT. Inpatient status is judged to be reasonable and necessary in order to provide the required intensity of service to ensure the patient's safety. The patient's presenting symptoms, physical exam findings, and initial radiographic and laboratory data in the context of their chronic comorbidities is felt to place them at high risk for further clinical deterioration. Furthermore, it is not anticipated that the patient will be medically stable for discharge from the hospital within 2 midnights of admission.   * I certify that at the point of admission it is my clinical judgment that the patient will require inpatient hospital care spanning beyond 2 midnights from the point of admission due to high intensity of service, high risk for further deterioration and high frequency of surveillance required.*  Author: Daved JAYSON Pump, DO 07/16/2024 6:14 PM  For on call review [email or web address].      [1] No Known Allergies  "

## 2024-07-16 NOTE — ED Notes (Signed)
 Pt back from CT

## 2024-07-16 NOTE — ED Notes (Signed)
 EDP at bedside

## 2024-07-16 NOTE — ED Notes (Signed)
 Lab contacted to draw labs from patient

## 2024-07-16 NOTE — Progress Notes (Addendum)
" °  Upon review of MRI results appears patient has had an acute CVA.   I have updated his wife of the results.  Given 325 baby asa and resumed his xarelto  and atorvastatin   Initiated neurochecks, will need to transfer to 1c  allow permissive HTN     "

## 2024-07-16 NOTE — ED Notes (Signed)
 Pt taken to CT.

## 2024-07-16 NOTE — Progress Notes (Signed)
 Patient transferring to unit 1C Room 116. Family updated.

## 2024-07-16 NOTE — ED Triage Notes (Signed)
 Arrived by [facility] from home. A week ago started new meds and was slurring his words since per family. Metoprolol  recently started. Reports new left leg weakness.   History stroke  EMS vitals: 134/74 b/p 89% RA and placed on 2L O2 with 94% 80HR 449CBG A&O x4 per EMS

## 2024-07-16 NOTE — Progress Notes (Signed)
 SPIRITUAL CARE AND COUNSELING CONSULT NOTE   VISIT SUMMARY Chaplain met patient's family while they were sitting in the hallway and offered a hospitable presence.  Chaplain offered to visit patient and spouse in the morning.   SPIRITUAL ENCOUNTER                                                                                                                                                                      Type of Visit: Initial Care provided to:: Family Reason for visit: Routine spiritual support OnCall Visit: Yes   SPIRITUAL FRAMEWORK      GOALS       INTERVENTIONS        INTERVENTION OUTCOMES      SPIRITUAL CARE PLAN        If immediate needs arise, please contact [facility] 24 hour on call [phone].   Hart Moats, Chaplain  07/16/2024 7:26 PM

## 2024-07-17 ENCOUNTER — Inpatient Hospital Stay: Admit: 2024-07-17

## 2024-07-17 DIAGNOSIS — N179 Acute kidney failure, unspecified: Secondary | ICD-10-CM

## 2024-07-17 DIAGNOSIS — I480 Paroxysmal atrial fibrillation: Secondary | ICD-10-CM | POA: Diagnosis not present

## 2024-07-17 DIAGNOSIS — I6340 Cerebral infarction due to embolism of unspecified cerebral artery: Secondary | ICD-10-CM

## 2024-07-17 DIAGNOSIS — A419 Sepsis, unspecified organism: Secondary | ICD-10-CM | POA: Diagnosis not present

## 2024-07-17 DIAGNOSIS — L8990 Pressure ulcer of unspecified site, unspecified stage: Secondary | ICD-10-CM | POA: Insufficient documentation

## 2024-07-17 DIAGNOSIS — I4891 Unspecified atrial fibrillation: Secondary | ICD-10-CM

## 2024-07-17 DIAGNOSIS — J189 Pneumonia, unspecified organism: Secondary | ICD-10-CM | POA: Diagnosis not present

## 2024-07-17 DIAGNOSIS — R6520 Severe sepsis without septic shock: Secondary | ICD-10-CM | POA: Diagnosis not present

## 2024-07-17 DIAGNOSIS — G9341 Metabolic encephalopathy: Secondary | ICD-10-CM | POA: Diagnosis not present

## 2024-07-17 DIAGNOSIS — I639 Cerebral infarction, unspecified: Secondary | ICD-10-CM

## 2024-07-17 LAB — GLUCOSE, CAPILLARY
Glucose-Capillary: 158 mg/dL — ABNORMAL HIGH (ref 70–99)
Glucose-Capillary: 130 mg/dL — ABNORMAL HIGH (ref 70–99)
Glucose-Capillary: 141 mg/dL — ABNORMAL HIGH (ref 70–99)

## 2024-07-17 LAB — MAGNESIUM: Magnesium: 2.3 mg/dL (ref 1.7–2.4)

## 2024-07-17 LAB — URINALYSIS, COMPLETE (UACMP) WITH MICROSCOPIC
Bilirubin Urine: NEGATIVE
Glucose, UA: NEGATIVE mg/dL
Ketones, ur: NEGATIVE mg/dL
Nitrite: NEGATIVE
Protein, ur: 100 mg/dL — AB
Specific Gravity, Urine: 1.024 (ref 1.005–1.030)
Squamous Epithelial / HPF: 0 /HPF (ref 0–5)
WBC, UA: >50 WBC/hpf (ref 0–5)
pH: 5 (ref 5.0–8.0)

## 2024-07-17 LAB — PROTEIN / CREATININE RATIO, URINE
Creatinine, Urine: 143 mg/dL
Protein Creatinine Ratio: 0.8 mg/mg — ABNORMAL HIGH
Total Protein, Urine: 119 mg/dL

## 2024-07-17 LAB — COMPREHENSIVE METABOLIC PANEL WITH GFR
ALT: 36 U/L (ref 0–44)
AST: 23 U/L (ref 15–41)
Albumin: 3.2 g/dL — ABNORMAL LOW (ref 3.5–5.0)
Alkaline Phosphatase: 106 U/L (ref 38–126)
Anion gap: 10 (ref 5–15)
BUN: 63 mg/dL — ABNORMAL HIGH (ref 8–23)
CO2: 24 mmol/L (ref 22–32)
Calcium: 9.2 mg/dL (ref 8.9–10.3)
Chloride: 108 mmol/L (ref 98–111)
Creatinine, Ser: 1.82 mg/dL — ABNORMAL HIGH (ref 0.61–1.24)
GFR, Estimated: 35 mL/min — ABNORMAL LOW
Glucose, Bld: 198 mg/dL — ABNORMAL HIGH (ref 70–99)
Potassium: 4.3 mmol/L (ref 3.5–5.1)
Sodium: 142 mmol/L (ref 135–145)
Total Bilirubin: 0.4 mg/dL (ref 0.0–1.2)
Total Protein: 6.6 g/dL (ref 6.5–8.1)

## 2024-07-17 LAB — CBC
HCT: 39.4 % (ref 39.0–52.0)
Hemoglobin: 11.8 g/dL — ABNORMAL LOW (ref 13.0–17.0)
MCH: 28.9 pg (ref 26.0–34.0)
MCHC: 29.9 g/dL — ABNORMAL LOW (ref 30.0–36.0)
MCV: 96.6 fL (ref 80.0–100.0)
Platelets: 209 10*3/uL (ref 150–400)
RBC: 4.08 MIL/uL — ABNORMAL LOW (ref 4.22–5.81)
RDW: 16 % — ABNORMAL HIGH (ref 11.5–15.5)
WBC: 9.6 10*3/uL (ref 4.0–10.5)
nRBC: 0 % (ref 0.0–0.2)

## 2024-07-17 LAB — PROTIME-INR
INR: 2.5 — ABNORMAL HIGH (ref 0.8–1.2)
Prothrombin Time: 28 s — ABNORMAL HIGH (ref 11.4–15.2)

## 2024-07-17 LAB — LIPID PANEL
Cholesterol: 89 mg/dL (ref 0–200)
HDL: 33 mg/dL — ABNORMAL LOW
LDL Cholesterol: 43 mg/dL (ref 0–99)
Total CHOL/HDL Ratio: 2.7 ratio
Triglycerides: 64 mg/dL
VLDL: 13 mg/dL (ref 0–40)

## 2024-07-17 LAB — MRSA NEXT GEN BY PCR, NASAL: MRSA by PCR Next Gen: NOT DETECTED

## 2024-07-17 LAB — HEMOGLOBIN A1C
Hgb A1c MFr Bld: 8.8 % — ABNORMAL HIGH (ref 4.8–5.6)
Mean Plasma Glucose: 205.86 mg/dL

## 2024-07-17 LAB — TSH: TSH: 1.87 u[IU]/mL (ref 0.350–4.500)

## 2024-07-17 LAB — LACTIC ACID, PLASMA: Lactic Acid, Venous: 1.1 mmol/L (ref 0.5–1.9)

## 2024-07-17 MED ORDER — AZITHROMYCIN 250 MG PO TABS: 500.0000 mg | ORAL_TABLET | Freq: Every day | ORAL | Status: DC

## 2024-07-17 MED ORDER — PIPERACILLIN-TAZOBACTAM 3.375 G IVPB
3.3750 g | Freq: Three times a day (TID) | INTRAVENOUS | Status: DC
  Administered 2024-07-17 – 2024-07-22 (×15): 3.375 g via INTRAVENOUS
  Filled 2024-07-17 (×14): qty 50

## 2024-07-17 MED ORDER — HEPARIN SODIUM (PORCINE) 5000 UNIT/ML IJ SOLN
5000.0000 [IU] | Freq: Three times a day (TID) | INTRAMUSCULAR | Status: DC
  Administered 2024-07-17 – 2024-07-18 (×3): 5000 [IU] via SUBCUTANEOUS
  Filled 2024-07-17 (×3): qty 1

## 2024-07-17 MED ORDER — IPRATROPIUM-ALBUTEROL 0.5-2.5 (3) MG/3ML IN SOLN
3.0000 mL | Freq: Two times a day (BID) | RESPIRATORY_TRACT | Status: DC
  Administered 2024-07-18 – 2024-07-19 (×2): 3 mL via RESPIRATORY_TRACT
  Filled 2024-07-17 (×3): qty 3

## 2024-07-17 MED ORDER — FUROSEMIDE 10 MG/ML IJ SOLN
40.0000 mg | Freq: Once | INTRAMUSCULAR | Status: AC
  Administered 2024-07-17: 40 mg via INTRAVENOUS
  Filled 2024-07-17: qty 4

## 2024-07-17 MED ORDER — HYDROCORTISONE SOD SUC (PF) 100 MG IJ SOLR
50.0000 mg | Freq: Four times a day (QID) | INTRAMUSCULAR | Status: DC
  Administered 2024-07-17 – 2024-07-19 (×9): 50 mg via INTRAVENOUS
  Filled 2024-07-17 (×9): qty 2

## 2024-07-17 MED ORDER — ASPIRIN 300 MG RE SUPP
300.0000 mg | Freq: Once | RECTAL | Status: AC
  Administered 2024-07-17: 300 mg via RECTAL
  Filled 2024-07-17: qty 1

## 2024-07-17 MED ORDER — METOPROLOL TARTRATE 5 MG/5ML IV SOLN: 5.0000 mg | INTRAVENOUS | Status: DC | PRN

## 2024-07-17 MED ORDER — METOPROLOL TARTRATE 5 MG/5ML IV SOLN
5.0000 mg | INTRAVENOUS | Status: DC | PRN
  Administered 2024-07-17: 5 mg via INTRAVENOUS
  Filled 2024-07-17: qty 5

## 2024-07-17 MED ORDER — CHLORHEXIDINE GLUCONATE CLOTH 2 % EX PADS
6.0000 | MEDICATED_PAD | Freq: Every day | CUTANEOUS | Status: DC
  Administered 2024-07-17 – 2024-07-20 (×4): 6 via TOPICAL

## 2024-07-17 NOTE — Progress Notes (Signed)
 0830 Patient received from 1 C via bed. Report received from Merilyn RN at bedside Patient currently in A.Fib. rate 77 with frequent PVC's.. Pulse oximeter 100% on 2 L [place]. MAE but not to command. Mumbling incoherently.

## 2024-07-17 NOTE — Progress Notes (Signed)
 Dr. Tobie in to see patient. New orders obtained. Patient to be transferred to higher level of care.  Room ICU 5

## 2024-07-17 NOTE — Progress Notes (Addendum)
 SPIRITUAL CARE AND COUNSELING CONSULT NOTE   VISIT SUMMARY Chaplain met patient's daughter yesterday and said she'd check on her family this morning.  Patient was sleeping with wife at bedside.  Wife was distressed about how patient was through the night (cold and change in condition since being moved from another Unit).  Chaplain offered a compassionate presence and prayer at wife's request.  Chaplain will ask colleague to visit patient later in the day.  Chaplain updated staff.   SPIRITUAL ENCOUNTER                                                                                                                                                                      Type of Visit: Initial Care provided to:: Pt and family Conversation partners present during encounter: Nurse Referral source: Family Reason for visit: Routine spiritual support OnCall Visit: Yes   SPIRITUAL FRAMEWORK  Presenting Themes: Significant life change Community/Connection: Family Patient Stress Factors: Health changes Family Stress Factors: Exhausted, Major life changes   GOALS       INTERVENTIONS   Spiritual Care Interventions Made: Prayer, Compassionate presence    INTERVENTION OUTCOMES   Outcomes: Reduced anxiety, Connection to spiritual care  SPIRITUAL CARE PLAN        If immediate needs arise, please contact [facility] 24 hour on call [phone].   Hart Moats, Chaplain  07/17/2024 7:16 AM

## 2024-07-17 NOTE — Progress Notes (Signed)
 SPIRITUAL CARE AND COUNSELING CONSULT NOTE   VISIT SUMMARY - Spouse of the patient very concerned due to conversation with health team. Spouse asked if the Chaplain would pray for them. Chaplain prayed and encouraged patient and family.  SPIRITUAL ENCOUNTER                                                                                                                                                                      Type of Visit: Follow up Care provided to:: Pt and family Referral source: Chaplain team Reason for visit: Routine spiritual support OnCall Visit: No   SPIRITUAL FRAMEWORK  Presenting Themes: Goals in life/care Community/Connection: Family   GOALS       INTERVENTIONS   Spiritual Care Interventions Made: Compassionate presence, Encouragement, Prayer    INTERVENTION OUTCOMES   Outcomes: Connection to spiritual care  SPIRITUAL CARE PLAN        If immediate needs arise, please contact [facility] 24 hour on call [phone]   DeJuan O Harris  07/17/2024 3:59 PM

## 2024-07-17 NOTE — Progress Notes (Signed)
 PT Cancellation Note  Patient Details Name: Gary Lynn MRN: [id number] DOB: Nov 03, 1930   Cancelled Treatment:    Reason Eval/Treat Not Completed: Other (comment). Pt noted to have transferred to ICU, PT to sign off. Please re-consult when pt is medically appropriate.    Doyal Shams PT, DPT 8:41 AM,07/17/24

## 2024-07-17 NOTE — Progress Notes (Signed)
 SLP Cancellation Note  Patient Details Name: Gary Lynn MRN: [id number] DOB: Dec 31, 1930   Cancelled treatment:       Reason Eval/Treat Not Completed: Medical issues which prohibited therapy;Patient not medically ready;Fatigue/lethargy limiting ability to participate (chart reviewed; consulted NSG/MD and gave recommendations.)   Pt is a 89 y.o. male with medical history significant of CAD s/p stenting, paroxysmal afib on xarelto , skin cancer status post radiation, T2DM presents to the emergency department for evaluation of altered mental status. Per his wife, he has been restless, unable to sleep, possibly having hallucinations for the past few days. A few weeks ago, he saw his primary care provider for a cough and was prescribed azithromycin , Lasix , and Tessalon  Perles. Cough has improved somewhat but persistent. He finished the azithromycin  a few days ago.   Family states that since he has been taking these new medications, they have noted increased confusion, left sided weakness, increased falls,  slurred speech, yellowing of his skin, and swelling in his lower extremities.They also note he recently finished radiation therapy from skin cancer.   This morning pt was transferred to CCU for a higher level of care. He is poorly alert/awake and not appropriate for BSE/assessment this morning. Consulted w/ NSG who agreed. Recommended continued NPO status and frequent oral for hygiene and stimulation of swallowing while NPO.  ST services will f/u tomorrow w/ ongoing assessment of readiness for BSE, po intake. NSG/MD agreed.        [name], MS, CCC-SLP Speech Language Pathologist Rehab Services; [facility] [phone] (ascom) [name] 07/17/2024, 10:07 AM

## 2024-07-17 NOTE — Progress Notes (Signed)
 OT Cancellation Note  Patient Details Name: Gary Lynn MRN: [id number] DOB: 1931/04/11   Cancelled Treatment:    Reason Eval/Treat Not Completed: Other (comment) Eval received, chart reviewed. Pt noted to have transferred to ICU d/t need for higher level of care with elevated HR, OT to sign off. Please re-consult when pt is medically appropriate.   [name] 07/17/2024, 9:08 AM

## 2024-07-17 NOTE — Progress Notes (Addendum)
 [facility]  - [place] [facility]   PATIENT NAME: Gary Lynn    MR#:  [id number]  DATE OF BIRTH:  April 05, 1931  SUBJECTIVE:  seen patient earlier. Wife and daughter at bedside. Came in with two week history of upper respiratory infection, weakness confusion and slurred speech. Patient currently unable to give any history review system. He is very lethargic. Heartrate was in the 130s earlier. Hard of hearing.    VITALS:  Blood pressure 110/81, pulse (!) 131, temperature 97.9 F (36.6 C), temperature source Axillary, resp. rate (!) 24, height 5' 6 (1.676 m), weight 60.7 kg, SpO2 100%.  PHYSICAL EXAMINATION:  limited GENERAL:  89 y.o.-year-old patient with acute distress. Lethargic LUNGS: N decreased breath sounds bilaterally, no wheezing CARDIOVASCULAR: S1, S2 normal. No murmur tachycardia ABDOMEN: Soft, nontender, nondistended.  EXTREMITIES: +++ edema b/l.    NEUROLOGIC: unable to assess due to lethargy SKIN per RN  LABORATORY PANEL:  CBC Recent Labs  Lab 07/17/24 0537  WBC 9.6  HGB 11.8*  HCT 39.4  PLT 209    Chemistries  Recent Labs  Lab 07/17/24 0537  NA 142  K 4.3  CL 108  CO2 24  GLUCOSE 198*  BUN 63*  CREATININE 1.82*  CALCIUM  9.2  MG 2.3  AST 23  ALT 36  ALKPHOS 106  BILITOT 0.4   Cardiac Enzymes No results for input(s): TROPONINI in the last 168 hours. RADIOLOGY:  MR BRAIN WO CONTRAST Result Date: 07/16/2024 CLINICAL DATA:  Initial evaluation for acute neuro deficit, stroke suspected. EXAM: MRI HEAD WITHOUT CONTRAST TECHNIQUE: Multiplanar, multiecho pulse sequences of the brain and surrounding structures were obtained without intravenous contrast. COMPARISON:  CT from earlier the same day. FINDINGS: Brain: Generalized age-related cerebral atrophy. Patchy T2/FLAIR hyperintensity involving the periventricular and deep white matter, consistent with chronic small vessel ischemic disease, mild for age. Few small remote lacunar  infarcts noted about the bilateral basal ganglia. 9 mm acute ischemic nonhemorrhagic infarct seen involving the white matter of the posterior right frontal centrum semi ovale (series 5, image 25). No significant mass effect. No other evidence for acute or subacute ischemia. No areas of chronic cortical infarction. No acute or chronic intracranial blood products. No mass lesion, midline shift or mass effect no hydrocephalus or extra-axial fluid collection. Pituitary gland within normal limits. Vascular: Major intracranial vascular flow voids are maintained. Hypoplastic right vertebral artery noted. Skull and upper cervical spine: Craniocervical junction within normal limits. Bone marrow signal intensity overall within normal limits. No scalp soft tissue abnormality. Sinuses/Orbits: Prior bilateral ocular lens replacement. Paranasal sinuses are clear. No mastoid effusion. 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 9 mm T2 hyperintense lesion within the left parotid gland (series 17, image 16), indeterminate, but could reflect a primary salivary neoplasm. IMPRESSION: 1. 9 mm acute ischemic nonhemorrhagic infarct involving the white matter of the posterior right frontal centrum semi ovale. 2. Underlying age-related cerebral atrophy with mild chronic small vessel ischemic disease, with a few small remote lacunar infarcts about the bilateral basal ganglia. 3. 9 mm T2 hyperintense lesion within the left parotid gland, indeterminate, but could reflect a primary salivary neoplasm. Electronically Signed   By: Morene Hoard M.D.   On: 07/16/2024 19:48   CT CHEST WO CONTRAST Result Date: 07/16/2024 EXAM: CT CHEST WITHOUT CONTRAST 07/16/2024 06:30:47 PM TECHNIQUE: CT of the chest was performed without the administration of intravenous contrast. Multiplanar reformatted images are provided for review. Automated exposure control, iterative reconstruction,  and/or weight based adjustment of the mA/kV was  utilized to reduce the radiation dose to as low as reasonably achievable. COMPARISON: Chest radiograph 07/16/2024 and PET CT 04/10/2024. CLINICAL HISTORY: Pneumonia, complication suspected, x-ray done. FINDINGS: MEDIASTINUM: Heart and pericardium are unremarkable. The central airways are clear. Vascular calcifications. No aortic aneurysm. The esophagus is decompressed. LYMPH NODES: Scattered mediastinal lymph nodes. No hilar or axillary lymphadenopathy. LUNGS AND PLEURA: Large bilateral pleural effusions. Diffuse airspace and interstitial disease throughout the lungs, more prominent on the right, and with air bronchograms. This likely represents superimposed edema or pneumonia. Diffuse interstitial fibrosis throughout the lungs with honeycomb changes. Bronchial wall thickening. Cystic structure in the right apex probably represents a large bulla surrounded by pleural effusion. Infiltrates and effusions are new since the prior study. No pneumothorax. SOFT TISSUES/BONES: Degenerative changes in the spine. No acute bony abnormalities. No acute abnormality of the soft tissues. UPPER ABDOMEN: Limited images of the upper abdomen demonstrates no acute abnormality. IMPRESSION: 1. Diffuse airspace and interstitial disease throughout the lungs, more prominent on the right, with air bronchograms, likely representing superimposed edema or pneumonia, new since the prior study. 2. Large bilateral pleural effusions, new since the prior study. 3. Diffuse chronic interstitial fibrosis, chronic bronchitic changes, and bullous emphysematous changes . Electronically signed by: Elsie Gravely MD 07/16/2024 06:41 PM EST RP Workstation: [id number]   CT Head Wo Contrast Result Date: 07/16/2024 EXAM: CT HEAD WITHOUT CONTRAST 07/16/2024 02:30:54 PM TECHNIQUE: CT of the head was performed without the administration of intravenous contrast. Automated exposure control, iterative reconstruction, and/or weight based adjustment of the mA/kV  was utilized to reduce the radiation dose to as low as reasonably achievable. COMPARISON: CT head 12/18/2017 and MRI brain 12/18/2017. CLINICAL HISTORY: 89 year old male with a week ago started new medications and was slurring his words since per family. Reports new left leg weakness. FINDINGS: BRAIN AND VENTRICLES: No acute hemorrhage. Redemonstrated bilateral chronic lacunar infarctions at the basal ganglia. No acute territorial infarction. There is overall similar mild generalized parenchymal volume loss. There is overall similar moderate scattered white matter hypodensities which are nonspecific but most commonly represent chronic microvascular ischemic changes. No hydrocephalus. No extra-axial collection. No mass effect or midline shift. ORBITS: Bilateral lens replacement. SINUSES: No acute abnormality. SOFT TISSUES AND SKULL: There is calcification of the small scalp vessels which can be seen in the setting of end-stage renal disease. No acute soft tissue abnormality. No skull fracture. VASCULATURE: Bilateral carotid calcifications. IMPRESSION: 1. No acute intracranial abnormality. 2. No substantial change since 12/18/2017. Electronically signed by: Prentice Spade MD 07/16/2024 03:31 PM EST RP Workstation: [id number]   DG Chest 2 View Result Date: 07/16/2024 EXAM: 2 VIEW(S) XRAY OF THE CHEST 07/16/2024 01:05:00 PM COMPARISON: 07/04/2024 CLINICAL HISTORY: Weakness. FINDINGS: LUNGS AND PLEURA: Low lung volumes. Worsening aeration to the right upper lobe. Progressive bilateral interstitial and alveolar opacities. Small bilateral pleural effusions, increased from prior study. . No pneumothorax. HEART AND MEDIASTINUM: Aortic atherosclerosis. No acute abnormality of the cardiac and mediastinal silhouettes. BONES AND SOFT TISSUES: Stable sclerotic lesion in right humeral head. IMPRESSION: 1. Progressive right upper lobe consolidation and increased bilateral interstitial and alveolar opacities. 2. Small bilateral  pleural effusions, increased from previous exam. Electronically signed by: Waddell Calk MD 07/16/2024 01:28 PM EST RP Workstation: [id number]    Assessment and Plan  From H and p  OSUALDO HANSELL is a 89 y.o. male with medical history significant of CAD s/p stenting, paroxysmal afib on xarelto , skin cancer status  post radiation, T2DM presents to the emergency department for evaluation of altered mental status.  He is accompanied by his wife and another family member who provided majority of the history due to patient being hard of hearing.  A few weeks ago, he saw his primary care provider for a cough and was prescribed azithromycin , Lasix , and Tessalon  Perles. Cough has improved somewhat but persistent.  He finished the azithromycin  a few days ago.   Family states that since he has been taking these new medications, they have noted increased confusion, left sided weakness, increased falls,  slurred speech, yellowing of his skin, and swelling in his lower extremities.They also note he recently finished radiation therapy from skin cancer.   At baseline uses 3 wheeled walker per wife. No falls at home per wife  AMS/acute metabolic encephalopathy Falls  acute CVA right frontal small ischemic non-hemorrhagic stroke - wife describes delirium, at the time of my encounter at baseline mental status.  slurred speech noted in the past few weeks with increasing falls and weakness, concerning for subacute CVA  CT head unremarkable.  - MRI brain 1. 9 mm acute ischemic nonhemorrhagic infarct involving the white matter of the posterior right frontal centrum semi ovale. 2. Underlying age-related cerebral atrophy with mild chronic small vessel ischemic disease, with a few small remote lacunar infarcts about the bilateral basal ganglia. 3. 9 mm T2 hyperintense lesion within the left parotid gland, indeterminate, but could reflect a primary salivary neoplasm. -- Neurology consultation with Dr. Germaine. Since  patient is NPO will use aspirin  per rectal at present. Will resume Xarelto  once able to tolerated PO  - PT/OT/SLP - fall and delerium precautions    Acute hypoxic respiratory failure  sepsis due to severe right upper lobe pneumonia bilateral large pleural effusion CHF acute on chronic diastolic--BNP 29,000  - with increasing b/l pleural effusions and progression of RUL consolidation  -start patient on Zosyn  and continue doxycycline  -- stress doses steroids with Solu-Medrol -- speech therapy to see patient -- once patient more stable will get thoracentesis  AKI  (baseline creat 1.1 in feb 2025) in the setting of sepsis - BUN 66, Cr 1.97. Baseline Cr around 1, with peripheral edema and pleural effusions despite recently starting lasix  20mg  daily (per wife)  - review home mediations when reconciled.  hold nephrotoxic agents  -- monitor input output  Elevated troponin - no CP. Suspect demand  - trend trop, monitor on telemetry  - ECHO    CAD s/p stenting  acute on chronic a fib with RVR  history of paroxysmal afib -[facility] cardiology consulted -- home meds on hold   HTN  - resume home medications as appropriate  - PRN hydralazine     T2DM  - SSI    Tobacco abuse  - nicotine  patch   Critically ill. Discussed code status with patient's wife and daughter at bedside. They will discuss with other siblings. Palliative care consultation   Spoke with pt's dter and wife on the phone and they request DNR/DNI  Procedures: Family communication : wife and daughter Consults : neurology, cardiology CODE STATUS: full DVT Prophylaxis : heparin  subcu Level of care: Stepdown Status is: Inpatient Remains inpatient appropriate because: sepsis due to pneumonia, a fib with RVR, CHF, AK I    TOTAL CRITICAL TIME TAKING CARE OF THIS PATIENT: 50 minutes.  >50% time spent on counselling and coordination of care  Note: This dictation was prepared with Dragon dictation along with smaller phrase  technology. Any  transcriptional errors that result from this process are unintentional.  Leita Blanch M.D    [facility]   CC: Primary care physician; Jadali, Fayegh, MD

## 2024-07-17 NOTE — Consult Note (Signed)
 "  Cardiology Consultation   Patient ID: Gary Lynn MRN: [id number]; DOB: [birth year]  Admit date: 07/16/2024 Date of Consult: 07/17/2024  PCP:  Weyman Bright, MD   [facility] Providers Cardiologist:  Deatrice Cage, MD        Patient Profile: Gary Lynn is a 89 y.o. male with a hx of coronary artery disease status post prior stenting of the RCA, PAF/flutter-Zio patch (05/2016), chronic diastolic CHF, asymptomatic PVCs, dizziness with activities, hypertension, hyperlipidemia, type 2 diabetes, carotid artery disease ,who is being seen 07/17/2024 for the evaluation of atrial fibrillation with RVR at the request of Dr. Tobie.  History of Present Illness: Gary Lynn has an extensive history with coronary artery disease with previous stent in the right coronary artery years ago.  Most recent cardiac catheterization was done in February 2023 which showed an occluded RCA with left-to-right collaterals, high-grade complex disease of the OM1 which was noted to be chronic, normal ejection fraction.  He was treated medically.  He has a known history of hypertension, diabetes, PVCs, PAD, hyperlipidemia carotid disease and paroxysmal atrial fibrillation.  He had a TIA in 2019 and was found to have significant carotid stenosis but declined surgery at that time.  History of paroxysmal atrial fibrillation and is on long-term anticoagulation with Xarelto .  Had previously not been seen in our clinic since 2020.  He was last evaluated 07/13/2024 by his primary cardiologist for worsening shortness of breath.  At that time he had been treated for bronchitis.  He denied any chest pain.  He was noted to be tachycardic on exam.  His amlodipine  was stopped and he was started on metoprolol  25 mg twice daily.  He was continued on Xarelto  that had been renally adjusted and was scheduled for an updated echocardiogram.  Patient presented to the [facility] emergency department via EMS from home on 07/16/2024.  He stated  a week ago he had started new medication was slurring his words since per his family.  He reported new left leg weakness with a history of stroke.  EMS vitals were blood pressure 130/74, 80% SpO2 on room air up to 94% on 2 L of O2 heart rate of 80.  Blood sugar was 449 he was alert and oriented x 4 per EMS.  Once he arrived in the emergency department history was primarily obtained from family at the bedside.  They stated over the past 2 weeks they noticed the patient been acting more altered.  They said it was almost as if he had been having hallucinations.  Additionally he been having a harder time ambulating.  He does have some balance issues at baseline but it was more severe.  This morning patient woke up screaming.  Family and asked was he had pain and he had denied this was the initial reason for the call to EMS.  Initial vital signs revealed blood pressure of 132/72, pulse of 73, respirations of 17, temperature of 97.7.  Labs reveal blood glucose of 370, BUN 66, serum creatinine 1.97, GFR 31, hemoglobin of 12.3, INR of 1.6, high-sensitivity troponin of 68.  EKG revealed sinus rhythm with first-degree AV block with a rate of 73 with a right bundle branch block with no ST elevation noted.  Chest x-ray showed progressive right upper lobe consolidation and increased bilateral interstitial and alveolar opacities, small bilateral pleural effusions increased from previous exam.  CT of the head showed no acute intercranial abnormality and no substantial change since 12/18/2017.  CODE STATUS  was addressed and patient wished to remain full code.  MRI was done of the head and.  Patient had an acute CVA.   Cardiology was consulted to assist with ongoing management for atrial fibrillation with RVR.  Past Medical History:  Diagnosis Date   Carotid artery stenosis    a. carotid doppler 05/2016: RICA 40-59%, LICA 1-39%   Chronic diastolic CHF (congestive heart failure) (HCC)    a. echo 05/2016: EF 55-60%, AK of the  basal-midinferolateral and inferior myocardium (c/w prior study), GR1DD, aortic sclerosis without stenosis, mild MR, PASP 40 mmHg   Coronary artery disease    Cardiac cath in February of 2013: EF 60%, high-grade chronic disease in OM 1, occluded mid RCA at the site of a previously placed stent with left-to-right collaterals, occluded left SFA.   Diabetes mellitus with complication (HCC)    Hypercholesterolemia    Hypertension    PAF (paroxysmal atrial fibrillation) (HCC)    a. Zio monitor 05/2016: predominant rhyhtm of sinus with intermittent episodes of Afib/flutter with the longest episode being 6 hours and 51 minutes with an average heart rate of 114 bpm. Occasional PACs and PVCs. b. CHADS2VASc --> 6 (CHF, HTN, age x 2, DM, vascular disease)    Past Surgical History:  Procedure Laterality Date   BACK SURGERY     years ago   CARDIAC CATHETERIZATION  07/2007   [facility]   CARDIAC CATHETERIZATION  07/2011   [facility]   CORONARY ANGIOPLASTY WITH STENT PLACEMENT       Home Medications:  Prior to Admission medications  Medication Sig Start Date End Date Taking? Authorizing Provider  albuterol  (VENTOLIN  HFA) 108 (90 Base) MCG/ACT inhaler Inhale 2 puffs into the lungs every 6 (six) hours as needed for wheezing or shortness of breath. 07/04/24  Yes Scoggins, Amber, NP  atorvastatin  (LIPITOR ) 40 MG tablet Take 1 tablet (40 mg total) by mouth daily. 03/29/24  Yes Scoggins, Hospital Doctor, NP  benzonatate  (TESSALON ) 200 MG capsule Take 1 capsule (200 mg total) by mouth 3 (three) times daily as needed. 07/04/24  Yes Scoggins, Amber, NP  finasteride  (PROSCAR ) 5 MG tablet Take 1 tablet (5 mg total) by mouth daily. 12/29/23  Yes McGowan, Clotilda A, PA-C  fish oil-omega-3 fatty acids 1000 MG capsule Take 1 g by mouth 2 (two) times daily.    Yes [provider]  furosemide  (LASIX ) 20 MG tablet Take 1 tablet (20 mg total) by mouth daily. 07/05/24 07/05/25 Yes Scoggins, Amber, NP  glipiZIDE  (GLUCOTROL  XL) 5 MG 24 hr  tablet Take 1 tablet (5 mg total) by mouth daily before supper. 05/15/24  Yes Scoggins, Hospital Doctor, NP  lisinopril  (ZESTRIL ) 10 MG tablet TAKE 2 TABLETS BY MOUTH TWICE DAILY BEFORE A MEAL Patient taking differently: Take 20 mg by mouth 2 (two) times daily. 06/28/24  Yes Scoggins, Amber, NP  Magnesium  250 MG TABS Take 250 mg by mouth daily.   Yes [provider]  metoprolol  tartrate (LOPRESSOR ) 25 MG tablet Take 1 tablet (25 mg total) by mouth 2 (two) times daily. 07/13/24 10/11/24 Yes Darron Deatrice LABOR, MD  Rivaroxaban  (XARELTO ) 15 MG TABS tablet Take 1 tablet (15 mg total) by mouth daily with supper. 03/29/24  Yes Scoggins, Amber, NP  RYBELSUS  7 MG TABS Take 1 tablet (7 mg total) by mouth daily. 05/07/24  Yes Scoggins, Amber, NP  tamsulosin  (FLOMAX ) 0.4 MG CAPS capsule Take 1 capsule (0.4 mg total) by mouth daily. TAKE 1 CAPSULE(0.4 MG) BY MOUTH DAILY 12/29/23  Yes  Helon Kirsch A, PA-C  Vibegron  (GEMTESA ) 75 MG TABS Take 1 tablet by mouth once daily 04/16/24  Yes McGowan, Kirsch A, PA-C  Continuous Glucose Sensor (FREESTYLE LIBRE 2 SENSOR) MISC APPLY ONE SENSOR EVERY 14 DAYS FOR USE WITH READER TO MONITOR BLOOD SUGAR VALUES 06/28/24   Scoggins, Hospital Doctor, NP  triamcinolone  0.1%-Eucerin equivalent 1:1 cream mixture Apply 1 Application topically 2 (two) times daily. Patient not taking: Reported on 07/13/2024 05/10/24   Scoggins, [facility], NP  triamcinolone  ointment (KENALOG ) 0.1 % Apply 1 Application topically 2 (two) times daily. 08/19/23   Menshew, Candida LULLA Kings, PA-C  triamcinolone  ointment (KENALOG ) 0.1 % Apply once-twice daily as needed for flares. Only use until resolved or up to two weeks out of a month. Avoid application to face armpits and groin Patient not taking: Reported on 07/13/2024 05/23/24       Scheduled Meds:   stroke: early stages of recovery book   Does not apply Once   aspirin   300 mg Rectal Once   atorvastatin   40 mg Oral Daily   Chlorhexidine  Gluconate Cloth  6 each Topical Daily    guaiFENesin   600 mg Oral BID   heparin  injection (subcutaneous)  5,000 Units Subcutaneous Q8H   hydrocortisone  sod succinate (SOLU-CORTEF ) inj  50 mg Intravenous Q6H   insulin  aspart  0-15 Units Subcutaneous TID WC   insulin  aspart  0-5 Units Subcutaneous QHS   ipratropium-albuterol   3 mL Nebulization Q6H   nicotine   7 mg Transdermal Daily   Continuous Infusions:  piperacillin -tazobactam (ZOSYN )  IV 3.375 g (07/17/24 1127)   PRN Meds: acetaminophen  **OR** acetaminophen , metoprolol  tartrate, ondansetron  **OR** ondansetron  (ZOFRAN ) IV  Allergies:   Allergies[1]  Social History:   Social History   Socioeconomic History   Marital status: Married    Spouse name: Not on file   Number of children: Not on file   Years of education: Not on file   Highest education level: Not on file  Occupational History   Not on file  Tobacco Use   Smoking status: Every Day    Current packs/day: 0.50    Average packs/day: 0.5 packs/day for 50.0 years (25.0 ttl pk-yrs)    Types: Cigarettes    Passive exposure: Current   Smokeless tobacco: Never   Tobacco comments:    1 pack per 3days  Vaping Use   Vaping status: Never Used  Substance and Sexual Activity   Alcohol use: No    Alcohol/week: 0.0 standard drinks of alcohol   Drug use: No   Sexual activity: Not on file  Other Topics Concern   Not on file  Social History Narrative   Not on file   Social Drivers of Health   Tobacco Use: High Risk (07/16/2024)   Patient History    Smoking Tobacco Use: Every Day    Smokeless Tobacco Use: Never    Passive Exposure: Current  Financial Resource Strain: Not on file  Food Insecurity: No Food Insecurity (07/16/2024)   Epic    Worried About Programme Researcher, Broadcasting/film/video in the Last Year: Never true    Ran Out of Food in the Last Year: Never true  Transportation Needs: No Transportation Needs (07/16/2024)   Epic    Lack of Transportation (Medical): No    Lack of Transportation (Non-Medical): No  Physical  Activity: Not on file  Stress: Not on file  Social Connections: Moderately Isolated (07/16/2024)   Social Connection and Isolation Panel    Frequency of Communication with Friends  and Family: More than three times a week    Frequency of Social Gatherings with Friends and Family: More than three times a week    Attends Religious Services: Patient declined    Active Member of Clubs or Organizations: No    Attends Banker Meetings: Never    Marital Status: Married  Catering Manager Violence: Not At Risk (07/16/2024)   Epic    Fear of Current or Ex-Partner: No    Emotionally Abused: No    Physically Abused: No    Sexually Abused: No  Depression (PHQ2-9): Low Risk (07/11/2024)   Depression (PHQ2-9)    PHQ-2 Score: 0  Alcohol Screen: Not on file  Housing: Low Risk (07/16/2024)   Epic    Unable to Pay for Housing in the Last Year: No    Number of Times Moved in the Last Year: 0    Homeless in the Last Year: No  Utilities: Not At Risk (07/16/2024)   Epic    Threatened with loss of utilities: No  Health Literacy: Not on file    Family History:    Family History  Problem Relation Age of Onset   Dementia Mother    Heart attack Father    Kidney disease Neg Hx    Prostate cancer Neg Hx      ROS:  Please see the history of present illness.  Review of Systems  Constitutional:  Positive for malaise/fatigue and weight loss.  Respiratory:  Positive for cough.   Cardiovascular:  Positive for leg swelling.  Neurological:  Positive for speech change, focal weakness and weakness.  Large quantity of information derived from patient's family at the bedside  All other ROS reviewed and negative.     Physical Exam/Data: Vitals:   07/17/24 0930 07/17/24 0945 07/17/24 1000 07/17/24 1321  BP: 111/79 113/80 110/81   Pulse: (!) 54 (!) 111 (!) 131   Resp: 19 (!) 25 (!) 24   Temp:      TempSrc:      SpO2: 97% 100% 100% 100%  Weight:      Height:        Intake/Output Summary  (Last 24 hours) at 07/17/2024 1440 Last data filed at 07/16/2024 2151 Gross per 24 hour  Intake --  Output 100 ml  Net -100 ml      07/17/2024    9:00 AM 07/17/2024    5:00 AM 07/16/2024    1:54 PM  Last 3 Weights  Weight (lbs) 133 lb 13.1 oz 144 lb 6.4 oz 138 lb 14.2 oz  Weight (kg) 60.7 kg 65.5 kg 63 kg     Body mass index is 21.6 kg/m.  General: Frail and chronically ill-appearing HEENT: normal Neck: Unable to determine JVD due to body positioning Vascular: No carotid bruits; Distal pulses 2+ bilaterally Cardiac:  normal S1, S2; IR IR; no murmur  Lungs: Rhonchorous to auscultation bilaterally on oxygen via nasal cannula Abd: soft, nontender, no hepatomegaly  Ext: 2+ edema BLE Musculoskeletal:  No deformities, BUE and BLE strength normal and equal Skin: warm and dry  Neuro: He is oriented to person and answers appropriately to simple questions Psych:  Normal affect   EKG:  The EKG was personally reviewed and demonstrates: Sinus rhythm with first gravy block with a rate of 73 with unifocal PVCs, chronic right bundle branch block, left intrafascicular block, and an old anterior lateral infarct Telemetry:  Telemetry was personally reviewed and demonstrates: Atrial fibrillation with RVR with rates of  130s  Relevant CV Studies: Echocardiogram ordered and pending with further recommendations to follow  Laboratory Data: High Sensitivity Troponin:  No results for input(s): TROPONINIHS in the last 720 hours.  Recent Labs  Lab 07/16/24 1339 07/16/24 1529  TRNPT 68* 56*      Chemistry Recent Labs  Lab 07/16/24 1339 07/17/24 0537  NA 140 142  K 4.4 4.3  CL 104 108  CO2 23 24  GLUCOSE 370* 198*  BUN 66* 63*  CREATININE 1.97* 1.82*  CALCIUM  9.2 9.2  MG  --  2.3  GFRNONAA 31* 35*  ANIONGAP 14 10    Recent Labs  Lab 07/16/24 1804 07/17/24 0537  PROT 6.9 6.6  ALBUMIN 3.4* 3.2*  AST 21 23  ALT 40 36  ALKPHOS 117 106  BILITOT 0.3 0.4   Lipids  Recent Labs  Lab  07/17/24 0537  CHOL 89  TRIG 64  HDL 33*  LDLCALC 43  CHOLHDL 2.7    Hematology Recent Labs  Lab 07/16/24 1339 07/17/24 0537  WBC 11.7* 9.6  RBC 4.27 4.08*  HGB 12.3* 11.8*  HCT 40.5 39.4  MCV 94.8 96.6  MCH 28.8 28.9  MCHC 30.4 29.9*  RDW 16.3* 16.0*  PLT 209 209   Thyroid  No results for input(s): TSH, FREET4 in the last 168 hours.  BNP Recent Labs  Lab 07/16/24 1339  PROBNP 29,741.0*    DDimer No results for input(s): DDIMER in the last 168 hours.  Radiology/Studies:  MR BRAIN WO CONTRAST Result Date: 07/16/2024 CLINICAL DATA:  Initial evaluation for acute neuro deficit, stroke suspected. EXAM: MRI HEAD WITHOUT CONTRAST TECHNIQUE: Multiplanar, multiecho pulse sequences of the brain and surrounding structures were obtained without intravenous contrast. COMPARISON:  CT from earlier the same day. FINDINGS: Brain: Generalized age-related cerebral atrophy. Patchy T2/FLAIR hyperintensity involving the periventricular and deep white matter, consistent with chronic small vessel ischemic disease, mild for age. Few small remote lacunar infarcts noted about the bilateral basal ganglia. 9 mm acute ischemic nonhemorrhagic infarct seen involving the white matter of the posterior right frontal centrum semi ovale (series 5, image 25). No significant mass effect. No other evidence for acute or subacute ischemia. No areas of chronic cortical infarction. No acute or chronic intracranial blood products. No mass lesion, midline shift or mass effect no hydrocephalus or extra-axial fluid collection. Pituitary gland within normal limits. Vascular: Major intracranial vascular flow voids are maintained. Hypoplastic right vertebral artery noted. Skull and upper cervical spine: Craniocervical junction within normal limits. Bone marrow signal intensity overall within normal limits. No scalp soft tissue abnormality. Sinuses/Orbits: Prior bilateral ocular lens replacement. Paranasal sinuses are clear. No  mastoid effusion. 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 9 mm T2 hyperintense lesion within the left parotid gland (series 17, image 16), indeterminate, but could reflect a primary salivary neoplasm. IMPRESSION: 1. 9 mm acute ischemic nonhemorrhagic infarct involving the white matter of the posterior right frontal centrum semi ovale. 2. Underlying age-related cerebral atrophy with mild chronic small vessel ischemic disease, with a few small remote lacunar infarcts about the bilateral basal ganglia. 3. 9 mm T2 hyperintense lesion within the left parotid gland, indeterminate, but could reflect a primary salivary neoplasm. Electronically Signed   By: Morene Hoard M.D.   On: 07/16/2024 19:48   CT CHEST WO CONTRAST Result Date: 07/16/2024 EXAM: CT CHEST WITHOUT CONTRAST 07/16/2024 06:30:47 PM TECHNIQUE: CT of the chest was performed without the administration of intravenous contrast. Multiplanar reformatted images are provided  for review. Automated exposure control, iterative reconstruction, and/or weight based adjustment of the mA/kV was utilized to reduce the radiation dose to as low as reasonably achievable. COMPARISON: Chest radiograph 07/16/2024 and PET CT 04/10/2024. CLINICAL HISTORY: Pneumonia, complication suspected, x-ray done. FINDINGS: MEDIASTINUM: Heart and pericardium are unremarkable. The central airways are clear. Vascular calcifications. No aortic aneurysm. The esophagus is decompressed. LYMPH NODES: Scattered mediastinal lymph nodes. No hilar or axillary lymphadenopathy. LUNGS AND PLEURA: Large bilateral pleural effusions. Diffuse airspace and interstitial disease throughout the lungs, more prominent on the right, and with air bronchograms. This likely represents superimposed edema or pneumonia. Diffuse interstitial fibrosis throughout the lungs with honeycomb changes. Bronchial wall thickening. Cystic structure in the right apex probably represents a large bulla  surrounded by pleural effusion. Infiltrates and effusions are new since the prior study. No pneumothorax. SOFT TISSUES/BONES: Degenerative changes in the spine. No acute bony abnormalities. No acute abnormality of the soft tissues. UPPER ABDOMEN: Limited images of the upper abdomen demonstrates no acute abnormality. IMPRESSION: 1. Diffuse airspace and interstitial disease throughout the lungs, more prominent on the right, with air bronchograms, likely representing superimposed edema or pneumonia, new since the prior study. 2. Large bilateral pleural effusions, new since the prior study. 3. Diffuse chronic interstitial fibrosis, chronic bronchitic changes, and bullous emphysematous changes . Electronically signed by: Elsie Gravely MD 07/16/2024 06:41 PM EST RP Workstation: [id number]   CT Head Wo Contrast Result Date: 07/16/2024 EXAM: CT HEAD WITHOUT CONTRAST 07/16/2024 02:30:54 PM TECHNIQUE: CT of the head was performed without the administration of intravenous contrast. Automated exposure control, iterative reconstruction, and/or weight based adjustment of the mA/kV was utilized to reduce the radiation dose to as low as reasonably achievable. COMPARISON: CT head 12/18/2017 and MRI brain 12/18/2017. CLINICAL HISTORY: 89 year old male with a week ago started new medications and was slurring his words since per family. Reports new left leg weakness. FINDINGS: BRAIN AND VENTRICLES: No acute hemorrhage. Redemonstrated bilateral chronic lacunar infarctions at the basal ganglia. No acute territorial infarction. There is overall similar mild generalized parenchymal volume loss. There is overall similar moderate scattered white matter hypodensities which are nonspecific but most commonly represent chronic microvascular ischemic changes. No hydrocephalus. No extra-axial collection. No mass effect or midline shift. ORBITS: Bilateral lens replacement. SINUSES: No acute abnormality. SOFT TISSUES AND SKULL: There is  calcification of the small scalp vessels which can be seen in the setting of end-stage renal disease. No acute soft tissue abnormality. No skull fracture. VASCULATURE: Bilateral carotid calcifications. IMPRESSION: 1. No acute intracranial abnormality. 2. No substantial change since 12/18/2017. Electronically signed by: Prentice Spade MD 07/16/2024 03:31 PM EST RP Workstation: [id number]   DG Chest 2 View Result Date: 07/16/2024 EXAM: 2 VIEW(S) XRAY OF THE CHEST 07/16/2024 01:05:00 PM COMPARISON: 07/04/2024 CLINICAL HISTORY: Weakness. FINDINGS: LUNGS AND PLEURA: Low lung volumes. Worsening aeration to the right upper lobe. Progressive bilateral interstitial and alveolar opacities. Small bilateral pleural effusions, increased from prior study. . No pneumothorax. HEART AND MEDIASTINUM: Aortic atherosclerosis. No acute abnormality of the cardiac and mediastinal silhouettes. BONES AND SOFT TISSUES: Stable sclerotic lesion in right humeral head. IMPRESSION: 1. Progressive right upper lobe consolidation and increased bilateral interstitial and alveolar opacities. 2. Small bilateral pleural effusions, increased from previous exam. Electronically signed by: Waddell Calk MD 07/16/2024 01:28 PM EST RP Workstation: [id number]     Assessment and Plan: Atrial fibrillation with RVR -Originally diagnosed with paroxysmal atrial fibrillation in 2017 with intermittent A-fib and a flutter - Continued on Xarelto   for CHA2DS2-VASc score of at least 6 for stroke prophylaxis - Previously on Xarelto  15 mg daily that was renally dosed -He was last seen in clinic 07/13/2024 and was noted to be in atypical atrial flutter with RVR, at that time amlodipine  was discontinued and he was started on metoprolol  25 mg twice daily and discussed possibility of cardioversion at that time patient preferred medications and an echocardiogram was scheduled -RVR likely driven by pneumonia, stress from stroke, and acute hypoxic respiratory  failure -Continue on telemetry monitoring -If blood pressure will allow consider adding metoprolol  back to his regimen when he is able to take p.o. medications -Continue with metoprolol  to tartrate 5 mg IV every 4 hours as needed for elevated heart rates -He is not an ideal candidate for diltiazem due to soft blood pressures or digoxin due to elevated kidney function -Consider amiodarone if rate is unable to be controlled -Echocardiogram ordered and pending with further recommendations to follow -Continue on telemetry monitoring  CVA right frontal small ischemic nonhemorrhagic stroke/altered mental status -Prior to arrival his wife describes him having delirium and screaming -Slurred speech noted in the past few weeks with increasing falls and weakness - CT of the head was unremarkable -MRI of the brain revealed 1.9 mm acute ischemic nonhemorrhagic infarct involving the white matter of the posterior right frontal centrum semi ovale -Patient currently n.p.o. receiving aspirin  per rectum -Once tolerating p.o. can resume Xarelto  -PT/OT/speech -Ongoing management per neurology and IM  Acute hypoxic respiratory failure/severe right upper lobe pneumonia and bilateral large pleural effusions/acute on chronic HFpEF -BNP of greater than 29,000 -Increasing bilateral pleural effusions with progression of right upper lobe consolidation -Continued on Zosyn  and doxycycline  -Continued on steroids -Thoracentesis is being considered once patient is more stable -Continue to titrate FiO2 to maintain oxygen saturations greater than equal to 92% - With AKI and hypotension unable to diurese patient -Supportive care -Echocardiogram ordered with further recommendations to follow -Daily weights and I's and O's  Elevated high-sensitivity troponin -High-sensitivity troponins trended 68 and 56 -Likely demand ischemia from atrial fibrillation and pneumonia -Not consistent with ACS -Echocardiogram ordered and  pending with further recommendations to follow - No plans for ischemic evaluation  AKI -Serum creatinine 1.82 -Baseline serum creatinine 1.2 -Monitor urine output -Daily BMP -Monitor/trend/replete electrolytes as needed -Avoid nephrotoxic agents were able  CAD status post stenting -Without angina -Not on antiplatelet medication given anticoagulation -Continued on atorvastatin  -Continue on telemetry monitoring -EKG as needed for pain or changes  Hyperlipidemia -LDL 43 -Continued on atorvastatin  40 mg daily  Hypotension -Longstanding history of orthostatic hypotension -blood pressure 92/59 -Blood pressure limits and rate controlling medications - Vital signs per unit protocol  Type 2 diabetes -Continued on insulin  -Ongoing management per IM   Risk Assessment/Risk Scores:         CHA2DS2-VASc Score = 8   This indicates a 10.8% annual risk of stroke. The patient's score is based upon: CHF History: 1 HTN History: 1 Diabetes History: 1 Stroke History: 2 Vascular Disease History: 1 Age Score: 2 Gender Score: 0        For questions or updates, please contact [facility] Please consult [email or web address] for contact info under      Signed, Tyrann Donaho, NP  07/17/2024 2:40 PM      [1] No Known Allergies  "

## 2024-07-17 NOTE — Progress Notes (Signed)
 Dr. Tobie made aware of elevated HR, starting to sustain above 120s' and episode of trigeminy overnight.

## 2024-07-17 NOTE — Progress Notes (Signed)
 Patient transferred from [facility] for stroke assessment.  Spouse remains present with patient in room throughout night shift.  RN able to perform general assessment.  Patient able answer all questions appropriately.  Noted to have slight deficits when performing NIH assessment.  Remains on O2 @4L , via  [place], continuous.  Lung fields remains clear but diminished.  Continues toh ave IVF at 50ml/hr, via RUE PIV.  Patient noted to tolerate IV therapy without difficulty.  Patient to continue to be monitored by hospital staff.

## 2024-07-17 NOTE — Plan of Care (Signed)

## 2024-07-17 NOTE — Consult Note (Addendum)
 "                                                                                   Consultation Note Date: 07/17/2024   Patient Name: Gary Lynn  DOB: January 20, 1931  MRN: [id number]  Age / Sex: 89 y.o., male  PCP: Weyman Bright, MD Referring Physician: Tobie Calix, MD  Reason for Consultation: Establishing goals of care  HPI/Patient Profile: Gary Lynn is a 89 y.o. male with medical history significant of CAD s/p stenting, paroxysmal afib on xarelto , skin cancer status post radiation, T2DM presents to the emergency department for evaluation of altered mental status.  He is accompanied by his wife and another family member who provided majority of the history due to patient being hard of hearing.   Clinical Assessment and Goals of Care: Notes and labs reviewed.  Into see patient.  His wife and one of his sons are at bedside.  His wife discusses that they have been married for 69 years.  They have 4/5 children living.  She states they have numerous grandkids and great-grandchildren.  She states at baseline he uses a 3 wheeled walker. She states in the summer he was mowing the yard. She discusses that at Christmas he was feeling fine. She shares that patient has been feeling poorly for over a month and has continued to decline.  She states she called 911 on Sunday but patient did not want to come to the hospital.  She states on Monday he was feeling worse and so he came here.  She discusses that she is trying to remain positive as everything is negative, negative, negative.  She discusses her trust in [place], and states the Lord's will be done.  She states she does not want her husband to suffer.  She states she also is not ready for him to go.  She discusses that he completed advanced directives years ago which stated he would not want to be put on a ventilator.  We discussed his current full code/full CODE STATUS, and what this means.  She states when he first came to the ED, he was asked  if you die do you want us  to bring you back, and he said yes.  She states she does not want to override this decision.  We discussed possible outcomes of CPR that produces ROSC. We discussed acceptable QOL. We discussed CPR and ventilator support, and scenarios of how they are used together.  She states she really does not want to think about his dying.  She states she will need to speak with the family before making any decisions.  Son at bedside advises he was a paramedic for 15 years.  He voices understanding of what was said and states he and his mother will talk with other family members.  SUMMARY OF RECOMMENDATIONS   Wife would like to speak with her children regarding CODE STATUS and other decisions.     Primary Diagnoses: Present on Admission:  CAP (community acquired pneumonia)  Paroxysmal atrial fibrillation (HCC)   I have reviewed the medical record, interviewed the patient and family, and examined the patient. The following aspects are pertinent.  Past Medical History:  Diagnosis Date   Carotid artery stenosis    a. carotid doppler 05/2016: RICA 40-59%, LICA 1-39%   Chronic diastolic CHF (congestive heart failure) (HCC)    a. echo 05/2016: EF 55-60%, AK of the basal-midinferolateral and inferior myocardium (c/w prior study), GR1DD, aortic sclerosis without stenosis, mild MR, PASP 40 mmHg   Coronary artery disease    Cardiac cath in February of 2013: EF 60%, high-grade chronic disease in OM 1, occluded mid RCA at the site of a previously placed stent with left-to-right collaterals, occluded left SFA.   Diabetes mellitus with complication (HCC)    Hypercholesterolemia    Hypertension    PAF (paroxysmal atrial fibrillation) (HCC)    a. Zio monitor 05/2016: predominant rhyhtm of sinus with intermittent episodes of Afib/flutter with the longest episode being 6 hours and 51 minutes with an average heart rate of 114 bpm. Occasional PACs and PVCs. b. CHADS2VASc --> 6 (CHF, HTN, age  x 2, DM, vascular disease)   Social History   Socioeconomic History   Marital status: Married    Spouse name: Not on file   Number of children: Not on file   Years of education: Not on file   Highest education level: Not on file  Occupational History   Not on file  Tobacco Use   Smoking status: Every Day    Current packs/day: 0.50    Average packs/day: 0.5 packs/day for 50.0 years (25.0 ttl pk-yrs)    Types: Cigarettes    Passive exposure: Current   Smokeless tobacco: Never   Tobacco comments:    1 pack per 3days  Vaping Use   Vaping status: Never Used  Substance and Sexual Activity   Alcohol use: No    Alcohol/week: 0.0 standard drinks of alcohol   Drug use: No   Sexual activity: Not on file  Other Topics Concern   Not on file  Social History Narrative   Not on file   Social Drivers of Health   Tobacco Use: High Risk (07/16/2024)   Patient History    Smoking Tobacco Use: Every Day    Smokeless Tobacco Use: Never    Passive Exposure: Current  Financial Resource Strain: Not on file  Food Insecurity: No Food Insecurity (07/16/2024)   Epic    Worried About Programme Researcher, Broadcasting/film/video in the Last Year: Never true    Ran Out of Food in the Last Year: Never true  Transportation Needs: No Transportation Needs (07/16/2024)   Epic    Lack of Transportation (Medical): No    Lack of Transportation (Non-Medical): No  Physical Activity: Not on file  Stress: Not on file  Social Connections: Moderately Isolated (07/16/2024)   Social Connection and Isolation Panel    Frequency of Communication with Friends and Family: More than three times a week    Frequency of Social Gatherings with Friends and Family: More than three times a week    Attends Religious Services: Patient declined    Database Administrator or Organizations: No    Attends Banker Meetings: Never    Marital Status: Married  Depression (PHQ2-9): Low Risk (07/11/2024)   Depression (PHQ2-9)    PHQ-2 Score: 0   Alcohol Screen: Not on file  Housing: Low Risk (07/16/2024)   Epic    Unable to Pay for Housing in the Last Year: No    Number of Times Moved in the Last Year: 0    Homeless in the Last  Year: No  Utilities: Not At Risk (07/16/2024)   Epic    Threatened with loss of utilities: No  Health Literacy: Not on file   Family History  Problem Relation Age of Onset   Dementia Mother    Heart attack Father    Kidney disease Neg Hx    Prostate cancer Neg Hx    Scheduled Meds:   stroke: early stages of recovery book   Does not apply Once   aspirin   300 mg Rectal Once   atorvastatin   40 mg Oral Daily   Chlorhexidine  Gluconate Cloth  6 each Topical Daily   guaiFENesin   600 mg Oral BID   heparin  injection (subcutaneous)  5,000 Units Subcutaneous Q8H   hydrocortisone  sod succinate (SOLU-CORTEF ) inj  50 mg Intravenous Q6H   insulin  aspart  0-15 Units Subcutaneous TID WC   insulin  aspart  0-5 Units Subcutaneous QHS   ipratropium-albuterol   3 mL Nebulization Q6H   nicotine   7 mg Transdermal Daily   Continuous Infusions:  piperacillin -tazobactam (ZOSYN )  IV 3.375 g (07/17/24 1127)   PRN Meds:.acetaminophen  **OR** acetaminophen , metoprolol  tartrate, ondansetron  **OR** ondansetron  (ZOFRAN ) IV Medications Prior to Admission:  Prior to Admission medications  Medication Sig Start Date End Date Taking? Authorizing Provider  albuterol  (VENTOLIN  HFA) 108 (90 Base) MCG/ACT inhaler Inhale 2 puffs into the lungs every 6 (six) hours as needed for wheezing or shortness of breath. 07/04/24  Yes Scoggins, Amber, NP  atorvastatin  (LIPITOR ) 40 MG tablet Take 1 tablet (40 mg total) by mouth daily. 03/29/24  Yes Scoggins, Amber, NP  benzonatate  (TESSALON ) 200 MG capsule Take 1 capsule (200 mg total) by mouth 3 (three) times daily as needed. 07/04/24  Yes Scoggins, Hospital Doctor, NP  finasteride  (PROSCAR ) 5 MG tablet Take 1 tablet (5 mg total) by mouth daily. 12/29/23  Yes McGowan, Clotilda A, PA-C  fish oil-omega-3 fatty acids  1000 MG capsule Take 1 g by mouth 2 (two) times daily.    Yes [provider]  furosemide  (LASIX ) 20 MG tablet Take 1 tablet (20 mg total) by mouth daily. 07/05/24 07/05/25 Yes Scoggins, Amber, NP  glipiZIDE  (GLUCOTROL  XL) 5 MG 24 hr tablet Take 1 tablet (5 mg total) by mouth daily before supper. 05/15/24  Yes Scoggins, Hospital Doctor, NP  lisinopril  (ZESTRIL ) 10 MG tablet TAKE 2 TABLETS BY MOUTH TWICE DAILY BEFORE A MEAL Patient taking differently: Take 20 mg by mouth 2 (two) times daily. 06/28/24  Yes Scoggins, Amber, NP  Magnesium  250 MG TABS Take 250 mg by mouth daily.   Yes [provider]  metoprolol  tartrate (LOPRESSOR ) 25 MG tablet Take 1 tablet (25 mg total) by mouth 2 (two) times daily. 07/13/24 10/11/24 Yes Darron Deatrice LABOR, MD  Rivaroxaban  (XARELTO ) 15 MG TABS tablet Take 1 tablet (15 mg total) by mouth daily with supper. 03/29/24  Yes Scoggins, Amber, NP  RYBELSUS  7 MG TABS Take 1 tablet (7 mg total) by mouth daily. 05/07/24  Yes Scoggins, Amber, NP  tamsulosin  (FLOMAX ) 0.4 MG CAPS capsule Take 1 capsule (0.4 mg total) by mouth daily. TAKE 1 CAPSULE(0.4 MG) BY MOUTH DAILY 12/29/23  Yes McGowan, Clotilda A, PA-C  Vibegron  (GEMTESA ) 75 MG TABS Take 1 tablet by mouth once daily 04/16/24  Yes McGowan, Clotilda A, PA-C  Continuous Glucose Sensor (FREESTYLE LIBRE 2 SENSOR) MISC APPLY ONE SENSOR EVERY 14 DAYS FOR USE WITH READER TO MONITOR BLOOD SUGAR VALUES 06/28/24   Scoggins, Amber, NP  triamcinolone  0.1%-Eucerin equivalent 1:1 cream mixture Apply 1 Application  topically 2 (two) times daily. Patient not taking: Reported on 07/13/2024 05/10/24   Scoggins, [facility], NP  triamcinolone  ointment (KENALOG ) 0.1 % Apply 1 Application topically 2 (two) times daily. 08/19/23   Menshew, Candida LULLA Kings, PA-C  triamcinolone  ointment (KENALOG ) 0.1 % Apply once-twice daily as needed for flares. Only use until resolved or up to two weeks out of a month. Avoid application to Lynn armpits and groin Patient not  taking: Reported on 07/13/2024 05/23/24      Allergies[1] Review of Systems  Unable to perform ROS   Physical Exam Constitutional:      Comments: Intermittently opens his eyes  Pulmonary:     Effort: Pulmonary effort is normal.  Skin:    General: Skin is warm and dry.     Vital Signs: BP 109/78   Pulse 68   Temp 97.9 F (36.6 C) (Axillary)   Resp (!) 21   Ht 5' 6 (1.676 m)   Wt 60.7 kg   SpO2 100%   BMI 21.60 kg/m  Pain Scale: 0-10   Pain Score: 0-No pain   SpO2: SpO2: 100 % O2 Device:SpO2: 100 % O2 Flow Rate: .O2 Flow Rate (L/min): 2 L/min  IO: Intake/output summary:  Intake/Output Summary (Last 24 hours) at 07/17/2024 1553 Last data filed at 07/16/2024 2151 Gross per 24 hour  Intake --  Output 100 ml  Net -100 ml    LBM:   Baseline Weight: Weight: 63.6 kg Most recent weight: Weight: 60.7 kg      Time In: 2:30 Time Out: 3:45  Time Total: 70 min Greater than 50%  of this time was spent counseling and coordinating care related to the above assessment and plan.  Signed by: Camelia Lewis, NP   Please contact Palliative Medicine Team phone at [phone] for questions and concerns.  For individual provider: See Amion                 [1] No Known Allergies  "

## 2024-07-17 NOTE — Progress Notes (Signed)
 PHARMACY CONSULT NOTE - FOLLOW UP  Pharmacy Consult for Electrolyte Monitoring and Replacement   Recent Labs: Potassium (mmol/L)  Date Value  07/17/2024 4.3   Magnesium  (mg/dL)  Date Value  98/72/7973 2.3   Calcium  (mg/dL)  Date Value  98/72/7973 9.2   Albumin (g/dL)  Date Value  98/72/7973 3.2 (L)   Sodium (mmol/L)  Date Value  07/17/2024 142  03/14/2018 130 (L)     Assessment: 89 y.o. male w/ PMH of CAD s/p stenting, paroxysmal afib on Xarelto , skin cancer status post radiation, T2DM presents to [facility] for evaluation of altered mental status. Pharmacy is asked to follow and replace electrolytes while in CCU  Goal of Therapy:  Electrolytes WNL  Plan:  --no electrolyte replacement warranted for today --recheck electrolytes in am  Gary Lynn ,PharmD Clinical Pharmacist 07/17/2024 9:48 AM

## 2024-07-17 NOTE — Progress Notes (Signed)
 Nurse request to assist family with goals of care. Wife stated his wishes are to not be on life support. Her four children were present which gave her comfort. Updated nurse.    07/17/24 1700  Spiritual Encounters  Type of Visit Follow up  Care provided to: Pt and family  Conversation partners present during encounter Nurse  Reason for visit Goals of care meeting  OnCall Visit Yes  Spiritual Framework  Presenting Themes Meaning/purpose/sources of inspiration

## 2024-07-17 NOTE — Consult Note (Signed)
 Reason for Consult:Acute CVA Requesting Physician: Tobie  CC: AMS  I have been asked by Dr. Tobie to see this patient in consultation for acute infarct.  HPI: Gary Lynn is an 89 y.o. male with medical history significant of CAD s/p stenting, paroxysmal afib on xarelto , skin cancer status post radiation, T2DM presented to the emergency department for evaluation of altered mental status.  All history obtained from the cahrt due to patient's mental status.  Patient had been restless, unable to sleep, possibly having hallucinations for the past few days.  On yesterday morning he was yelling in pain, however when his wife asked him what is wrong, he said he did not know, so she called 911.    A few weeks ago, he saw his primary care provider for a cough and was prescribed azithromycin , Lasix , and Tessalon  Perles. Cough has improved somewhat but persistent.  He finished the azithromycin  a few days ago.   Family states that since he has been taking these new medications, they have noted increased confusion, left sided weakness, increased falls,  slurred speech, yellowing of his skin, and swelling in his lower extremities.They also note he recently finished radiation therapy from skin cancer.  He has had no fevers, nausea, vomiting, diarrhea.   LKW: Unable to determine TNK candidate: No, Unknown LKW, On Xarelto  Thrombolytic candidate: No, Unknown LKW mRankin: 1   Past Medical History:  Diagnosis Date   Carotid artery stenosis    a. carotid doppler 05/2016: RICA 40-59%, LICA 1-39%   Chronic diastolic CHF (congestive heart failure) (HCC)    a. echo 05/2016: EF 55-60%, AK of the basal-midinferolateral and inferior myocardium (c/w prior study), GR1DD, aortic sclerosis without stenosis, mild MR, PASP 40 mmHg   Coronary artery disease    Cardiac cath in February of 2013: EF 60%, high-grade chronic disease in OM 1, occluded mid RCA at the site of a previously placed stent with left-to-right  collaterals, occluded left SFA.   Diabetes mellitus with complication (HCC)    Hypercholesterolemia    Hypertension    PAF (paroxysmal atrial fibrillation) (HCC)    a. Zio monitor 05/2016: predominant rhyhtm of sinus with intermittent episodes of Afib/flutter with the longest episode being 6 hours and 51 minutes with an average heart rate of 114 bpm. Occasional PACs and PVCs. b. CHADS2VASc --> 6 (CHF, HTN, age x 2, DM, vascular disease)    Past Surgical History:  Procedure Laterality Date   BACK SURGERY     years ago   CARDIAC CATHETERIZATION  07/2007   [facility]   CARDIAC CATHETERIZATION  07/2011   [facility]   CORONARY ANGIOPLASTY WITH STENT PLACEMENT      Family History  Problem Relation Age of Onset   Dementia Mother    Heart attack Father    Kidney disease Neg Hx    Prostate cancer Neg Hx     Social History:  reports that he has been smoking cigarettes. He has a 25 pack-year smoking history. He has been exposed to tobacco smoke. He has never used smokeless tobacco. He reports that he does not drink alcohol and does not use drugs.  Allergies[1]  Medications: I have reviewed the patient's current medications. Prior to Admission:  Medications Prior to Admission  Medication Sig Dispense Refill Last Dose/Taking   albuterol  (VENTOLIN  HFA) 108 (90 Base) MCG/ACT inhaler Inhale 2 puffs into the lungs every 6 (six) hours as needed for wheezing or shortness of breath. 8 g 2 Unknown   atorvastatin  (  LIPITOR ) 40 MG tablet Take 1 tablet (40 mg total) by mouth daily. 90 tablet 1 07/15/2024   benzonatate  (TESSALON ) 200 MG capsule Take 1 capsule (200 mg total) by mouth 3 (three) times daily as needed. 20 capsule 0 07/16/2024   finasteride  (PROSCAR ) 5 MG tablet Take 1 tablet (5 mg total) by mouth daily. 90 tablet 3 07/15/2024 Evening   fish oil-omega-3 fatty acids 1000 MG capsule Take 1 g by mouth 2 (two) times daily.    07/16/2024   furosemide  (LASIX ) 20 MG tablet Take 1 tablet (20 mg total) by mouth  daily. 30 tablet 0 07/16/2024   glipiZIDE  (GLUCOTROL  XL) 5 MG 24 hr tablet Take 1 tablet (5 mg total) by mouth daily before supper. 90 tablet 1 07/15/2024 Evening   lisinopril  (ZESTRIL ) 10 MG tablet TAKE 2 TABLETS BY MOUTH TWICE DAILY BEFORE A MEAL (Patient taking differently: Take 20 mg by mouth 2 (two) times daily.) 360 tablet 0 07/16/2024   Magnesium  250 MG TABS Take 250 mg by mouth daily.   07/16/2024   metoprolol  tartrate (LOPRESSOR ) 25 MG tablet Take 1 tablet (25 mg total) by mouth 2 (two) times daily. 180 tablet 1 07/16/2024 Morning   Rivaroxaban  (XARELTO ) 15 MG TABS tablet Take 1 tablet (15 mg total) by mouth daily with supper. 90 tablet 1 07/15/2024 Evening   RYBELSUS  7 MG TABS Take 1 tablet (7 mg total) by mouth daily. 90 tablet 3 07/16/2024 Morning   tamsulosin  (FLOMAX ) 0.4 MG CAPS capsule Take 1 capsule (0.4 mg total) by mouth daily. TAKE 1 CAPSULE(0.4 MG) BY MOUTH DAILY 90 capsule 3 07/15/2024 Evening   Vibegron  (GEMTESA ) 75 MG TABS Take 1 tablet by mouth once daily 30 tablet 3 07/15/2024 Evening   Continuous Glucose Sensor (FREESTYLE LIBRE 2 SENSOR) MISC APPLY ONE SENSOR EVERY 14 DAYS FOR USE WITH READER TO MONITOR BLOOD SUGAR VALUES 6 each 3    triamcinolone  0.1%-Eucerin equivalent 1:1 cream mixture Apply 1 Application topically 2 (two) times daily. (Patient not taking: Reported on 07/13/2024) 160 g 1    triamcinolone  ointment (KENALOG ) 0.1 % Apply 1 Application topically 2 (two) times daily. 80 g 1    triamcinolone  ointment (KENALOG ) 0.1 % Apply once-twice daily as needed for flares. Only use until resolved or up to two weeks out of a month. Avoid application to face armpits and groin (Patient not taking: Reported on 07/13/2024) 80 g 0    Scheduled:   stroke: early stages of recovery book   Does not apply Once   aspirin   300 mg Rectal Once   atorvastatin   40 mg Oral Daily   Chlorhexidine  Gluconate Cloth  6 each Topical Daily   guaiFENesin   600 mg Oral BID   heparin  injection (subcutaneous)   5,000 Units Subcutaneous Q8H   hydrocortisone  sod succinate (SOLU-CORTEF ) inj  50 mg Intravenous Q6H   insulin  aspart  0-15 Units Subcutaneous TID WC   insulin  aspart  0-5 Units Subcutaneous QHS   ipratropium-albuterol   3 mL Nebulization Q6H   nicotine   7 mg Transdermal Daily    ROS: Unable to determine due to lethargy  Physical Examination: Blood pressure 110/81, pulse (!) 131, temperature 97.9 F (36.6 C), temperature source Axillary, resp. rate (!) 24, height 5' 6 (1.676 m), weight 60.7 kg, SpO2 100%.  Gen:NAD  Neurological Examination   Mental Status: Lethargic.  Opens eyes when name called.  Follows simple commands and answers one-word questions.  Has difficulty with longer responses.  Names objects appropriately.  Cranial Nerves: II: Blinks to bilateral confrontation.  Pupils reactive bilaterally III,IV, VI: ptosis not present, extra-ocular motions intact bilaterally V,VII: face symmetric, facial light touch sensation normal bilaterally VIII: hearing normal bilaterally XI: bilateral shoulder shrug XII: midline tongue extension Motor: Can lift all extremities weakly against gravity and maintain.  No focal weakness appreciated Sensory: Pinprick and light touch intact throughout, bilaterally Deep Tendon Reflexes: Symmetric throughout Plantars: Right: absent   Left: absent Cerebellar: Not performed due to weakness Gait: not tested due to safety concerns     Laboratory Studies:   Basic Metabolic Panel: Recent Labs  Lab 07/16/24 1339 07/17/24 0537  NA 140 142  K 4.4 4.3  CL 104 108  CO2 23 24  GLUCOSE 370* 198*  BUN 66* 63*  CREATININE 1.97* 1.82*  CALCIUM  9.2 9.2  MG  --  2.3    Liver Function Tests: Recent Labs  Lab 07/16/24 1804 07/17/24 0537  AST 21 23  ALT 40 36  ALKPHOS 117 106  BILITOT 0.3 0.4  PROT 6.9 6.6  ALBUMIN 3.4* 3.2*   No results for input(s): LIPASE, AMYLASE in the last 168 hours. Recent Labs  Lab 07/16/24 1804  AMMONIA <13     CBC: Recent Labs  Lab 07/16/24 1339 07/17/24 0537  WBC 11.7* 9.6  HGB 12.3* 11.8*  HCT 40.5 39.4  MCV 94.8 96.6  PLT 209 209    Cardiac Enzymes: No results for input(s): CKTOTAL, CKMB, CKMBINDEX, TROPONINI in the last 168 hours.  BNP: Invalid input(s): POCBNP  CBG: Recent Labs  Lab 07/16/24 1948 07/17/24 0853  GLUCAP 279* 158*    Microbiology: Results for orders placed or performed during the hospital encounter of 07/16/24  Blood culture (routine x 2)     Status: None (Preliminary result)   Collection Time: 07/16/24  3:29 PM   Specimen: BLOOD  Result Value Ref Range Status   Specimen Description BLOOD RIGHT WRIST  Final   Special Requests   Final    BOTTLES DRAWN AEROBIC ONLY Blood Culture results may not be optimal due to an inadequate volume of blood received in culture bottles   Culture   Final    NO GROWTH < 24 HOURS Performed at [facility], [address] [place], [place] [address]    Report Status PENDING  Incomplete  MRSA Next Gen by PCR, Nasal     Status: None   Collection Time: 07/17/24  9:06 AM   Specimen: Nasal Mucosa; Nasal Swab  Result Value Ref Range Status   MRSA by PCR Next Gen NOT DETECTED NOT DETECTED Final    Comment: (NOTE) The GeneXpert MRSA Assay (FDA approved for NASAL specimens only), is one component of a comprehensive MRSA colonization surveillance program. It is not intended to diagnose MRSA infection nor to guide or monitor treatment for MRSA infections. Test performance is not FDA approved in patients less than 3 years old. Performed at [facility], [address] [place], [place] [address]     Coagulation Studies: Recent Labs    07/16/24 1339 07/17/24 0537  LABPROT 19.9* 28.0*  INR 1.6* 2.5*    Urinalysis:  Recent Labs  Lab 07/17/24 0530  COLORURINE YELLOW*  LABSPEC 1.024  PHURINE 5.0  GLUCOSEU NEGATIVE  HGBUR MODERATE*  BILIRUBINUR NEGATIVE  KETONESUR NEGATIVE   PROTEINUR 100*  NITRITE NEGATIVE  LEUKOCYTESUR LARGE*    Lipid Panel:     Component Value Date/Time   CHOL 89 07/17/2024 0537   TRIG 64 07/17/2024  0537   HDL 33 (L) 07/17/2024 0537   CHOLHDL 2.7 07/17/2024 0537   VLDL 13 07/17/2024 0537   LDLCALC 43 07/17/2024 0537    HgbA1C:  Lab Results  Component Value Date   HGBA1C 8.8 (H) 07/16/2024    Urine Drug Screen:  No results found for: LABOPIA, COCAINSCRNUR, LABBENZ, AMPHETMU, THCU, LABBARB  Alcohol Level: No results for input(s): ETH in the last 168 hours.  Other results: EKG: sinus rhythm at 73 bpm with frequent PVC's noted, 1st degree AV block.  Imaging: MR BRAIN WO CONTRAST Result Date: 07/16/2024 CLINICAL DATA:  Initial evaluation for acute neuro deficit, stroke suspected. EXAM: MRI HEAD WITHOUT CONTRAST TECHNIQUE: Multiplanar, multiecho pulse sequences of the brain and surrounding structures were obtained without intravenous contrast. COMPARISON:  CT from earlier the same day. FINDINGS: Brain: Generalized age-related cerebral atrophy. Patchy T2/FLAIR hyperintensity involving the periventricular and deep white matter, consistent with chronic small vessel ischemic disease, mild for age. Few small remote lacunar infarcts noted about the bilateral basal ganglia. 9 mm acute ischemic nonhemorrhagic infarct seen involving the white matter of the posterior right frontal centrum semi ovale (series 5, image 25). No significant mass effect. No other evidence for acute or subacute ischemia. No areas of chronic cortical infarction. No acute or chronic intracranial blood products. No mass lesion, midline shift or mass effect no hydrocephalus or extra-axial fluid collection. Pituitary gland within normal limits. Vascular: Major intracranial vascular flow voids are maintained. Hypoplastic right vertebral artery noted. Skull and upper cervical spine: Craniocervical junction within normal limits. Bone marrow signal intensity overall  within normal limits. No scalp soft tissue abnormality. Sinuses/Orbits: Prior bilateral ocular lens replacement. Paranasal sinuses are clear. No mastoid effusion. 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 9 mm T2 hyperintense lesion within the left parotid gland (series 17, image 16), indeterminate, but could reflect a primary salivary neoplasm. IMPRESSION: 1. 9 mm acute ischemic nonhemorrhagic infarct involving the white matter of the posterior right frontal centrum semi ovale. 2. Underlying age-related cerebral atrophy with mild chronic small vessel ischemic disease, with a few small remote lacunar infarcts about the bilateral basal ganglia. 3. 9 mm T2 hyperintense lesion within the left parotid gland, indeterminate, but could reflect a primary salivary neoplasm. Electronically Signed   By: Morene Hoard M.D.   On: 07/16/2024 19:48   CT CHEST WO CONTRAST Result Date: 07/16/2024 EXAM: CT CHEST WITHOUT CONTRAST 07/16/2024 06:30:47 PM TECHNIQUE: CT of the chest was performed without the administration of intravenous contrast. Multiplanar reformatted images are provided for review. Automated exposure control, iterative reconstruction, and/or weight based adjustment of the mA/kV was utilized to reduce the radiation dose to as low as reasonably achievable. COMPARISON: Chest radiograph 07/16/2024 and PET CT 04/10/2024. CLINICAL HISTORY: Pneumonia, complication suspected, x-ray done. FINDINGS: MEDIASTINUM: Heart and pericardium are unremarkable. The central airways are clear. Vascular calcifications. No aortic aneurysm. The esophagus is decompressed. LYMPH NODES: Scattered mediastinal lymph nodes. No hilar or axillary lymphadenopathy. LUNGS AND PLEURA: Large bilateral pleural effusions. Diffuse airspace and interstitial disease throughout the lungs, more prominent on the right, and with air bronchograms. This likely represents superimposed edema or pneumonia. Diffuse interstitial fibrosis  throughout the lungs with honeycomb changes. Bronchial wall thickening. Cystic structure in the right apex probably represents a large bulla surrounded by pleural effusion. Infiltrates and effusions are new since the prior study. No pneumothorax. SOFT TISSUES/BONES: Degenerative changes in the spine. No acute bony abnormalities. No acute abnormality of the soft tissues. UPPER  ABDOMEN: Limited images of the upper abdomen demonstrates no acute abnormality. IMPRESSION: 1. Diffuse airspace and interstitial disease throughout the lungs, more prominent on the right, with air bronchograms, likely representing superimposed edema or pneumonia, new since the prior study. 2. Large bilateral pleural effusions, new since the prior study. 3. Diffuse chronic interstitial fibrosis, chronic bronchitic changes, and bullous emphysematous changes . Electronically signed by: Elsie Gravely MD 07/16/2024 06:41 PM EST RP Workstation: [id number]   CT Head Wo Contrast Result Date: 07/16/2024 EXAM: CT HEAD WITHOUT CONTRAST 07/16/2024 02:30:54 PM TECHNIQUE: CT of the head was performed without the administration of intravenous contrast. Automated exposure control, iterative reconstruction, and/or weight based adjustment of the mA/kV was utilized to reduce the radiation dose to as low as reasonably achievable. COMPARISON: CT head 12/18/2017 and MRI brain 12/18/2017. CLINICAL HISTORY: 89 year old male with a week ago started new medications and was slurring his words since per family. Reports new left leg weakness. FINDINGS: BRAIN AND VENTRICLES: No acute hemorrhage. Redemonstrated bilateral chronic lacunar infarctions at the basal ganglia. No acute territorial infarction. There is overall similar mild generalized parenchymal volume loss. There is overall similar moderate scattered white matter hypodensities which are nonspecific but most commonly represent chronic microvascular ischemic changes. No hydrocephalus. No extra-axial collection.  No mass effect or midline shift. ORBITS: Bilateral lens replacement. SINUSES: No acute abnormality. SOFT TISSUES AND SKULL: There is calcification of the small scalp vessels which can be seen in the setting of end-stage renal disease. No acute soft tissue abnormality. No skull fracture. VASCULATURE: Bilateral carotid calcifications. IMPRESSION: 1. No acute intracranial abnormality. 2. No substantial change since 12/18/2017. Electronically signed by: Prentice Spade MD 07/16/2024 03:31 PM EST RP Workstation: [id number]   DG Chest 2 View Result Date: 07/16/2024 EXAM: 2 VIEW(S) XRAY OF THE CHEST 07/16/2024 01:05:00 PM COMPARISON: 07/04/2024 CLINICAL HISTORY: Weakness. FINDINGS: LUNGS AND PLEURA: Low lung volumes. Worsening aeration to the right upper lobe. Progressive bilateral interstitial and alveolar opacities. Small bilateral pleural effusions, increased from prior study. . No pneumothorax. HEART AND MEDIASTINUM: Aortic atherosclerosis. No acute abnormality of the cardiac and mediastinal silhouettes. BONES AND SOFT TISSUES: Stable sclerotic lesion in right humeral head. IMPRESSION: 1. Progressive right upper lobe consolidation and increased bilateral interstitial and alveolar opacities. 2. Small bilateral pleural effusions, increased from previous exam. Electronically signed by: Waddell Calk MD 07/16/2024 01:28 PM EST RP Workstation: [id number]     Assessment/Plan: 89 y.o. male with medical history significant of CAD s/p stenting, paroxysmal afib on xarelto , skin cancer status post radiation, T2DM presented to the emergency department for evaluation of altered mental status.  Complaints of generalized weakness as well.  Found to have PNA and AKI.  MRI of the brain performed and personally reviewed.  MRI shows a small acute right posterior frontal centrum semiovale.  With history of afib likely embolic.  Mental status likely infectious/metabolic in etiology.  Although present, acute infarct likely  contributing little due to its size.   LDL 43 on Lipitor .  A1c 8.8 Echocardiogram pending  Recommendations: Glycemic control. Goal A1c<7.0 ASA 300mg  per rectum to be continued for 1-2 days before restart of anticoagulation Carotid dopplers Frequent neuro checks Telemetry  Case discussed with Dr. Tobie Sonny Hock, MD Neurology  07/17/2024, 2:02 PM          [1] No Known Allergies

## 2024-07-18 ENCOUNTER — Inpatient Hospital Stay

## 2024-07-18 ENCOUNTER — Telehealth (HOSPITAL_COMMUNITY): Payer: Self-pay

## 2024-07-18 ENCOUNTER — Other Ambulatory Visit (HOSPITAL_COMMUNITY): Payer: Self-pay

## 2024-07-18 ENCOUNTER — Inpatient Hospital Stay
Admit: 2024-07-18 | Discharge: 2024-07-18 | Disposition: A | Attending: Emergency Medicine | Admitting: Emergency Medicine

## 2024-07-18 DIAGNOSIS — Z9889 Other specified postprocedural states: Secondary | ICD-10-CM | POA: Diagnosis not present

## 2024-07-18 DIAGNOSIS — J189 Pneumonia, unspecified organism: Secondary | ICD-10-CM | POA: Diagnosis not present

## 2024-07-18 DIAGNOSIS — I350 Nonrheumatic aortic (valve) stenosis: Secondary | ICD-10-CM | POA: Diagnosis not present

## 2024-07-18 DIAGNOSIS — I639 Cerebral infarction, unspecified: Secondary | ICD-10-CM | POA: Diagnosis not present

## 2024-07-18 DIAGNOSIS — I4891 Unspecified atrial fibrillation: Secondary | ICD-10-CM | POA: Diagnosis not present

## 2024-07-18 DIAGNOSIS — R531 Weakness: Principal | ICD-10-CM

## 2024-07-18 DIAGNOSIS — G9341 Metabolic encephalopathy: Secondary | ICD-10-CM | POA: Diagnosis not present

## 2024-07-18 DIAGNOSIS — J90 Pleural effusion, not elsewhere classified: Secondary | ICD-10-CM

## 2024-07-18 DIAGNOSIS — N179 Acute kidney failure, unspecified: Secondary | ICD-10-CM

## 2024-07-18 DIAGNOSIS — I480 Paroxysmal atrial fibrillation: Secondary | ICD-10-CM | POA: Diagnosis not present

## 2024-07-18 DIAGNOSIS — I420 Dilated cardiomyopathy: Secondary | ICD-10-CM | POA: Diagnosis not present

## 2024-07-18 LAB — RENAL FUNCTION PANEL
Albumin: 3.1 g/dL — ABNORMAL LOW (ref 3.5–5.0)
Anion gap: 13 (ref 5–15)
BUN: 63 mg/dL — ABNORMAL HIGH (ref 8–23)
CO2: 24 mmol/L (ref 22–32)
Calcium: 8.4 mg/dL — ABNORMAL LOW (ref 8.9–10.3)
Chloride: 107 mmol/L (ref 98–111)
Creatinine, Ser: 1.93 mg/dL — ABNORMAL HIGH (ref 0.61–1.24)
GFR, Estimated: 32 mL/min — ABNORMAL LOW
Glucose, Bld: 173 mg/dL — ABNORMAL HIGH (ref 70–99)
Phosphorus: 5.5 mg/dL — ABNORMAL HIGH (ref 2.5–4.6)
Potassium: 4.3 mmol/L (ref 3.5–5.1)
Sodium: 144 mmol/L (ref 135–145)

## 2024-07-18 LAB — BODY FLUID CELL COUNT WITH DIFFERENTIAL
Eos, Fluid: 0 %
Lymphs, Fluid: 92 %
Monocyte-Macrophage-Serous Fluid: 4 %
Neutrophil Count, Fluid: 4 %
Total Nucleated Cell Count, Fluid: 662 uL

## 2024-07-18 LAB — GLUCOSE, CAPILLARY
Glucose-Capillary: 162 mg/dL — ABNORMAL HIGH (ref 70–99)
Glucose-Capillary: 193 mg/dL — ABNORMAL HIGH (ref 70–99)
Glucose-Capillary: 399 mg/dL — ABNORMAL HIGH (ref 70–99)
Glucose-Capillary: 335 mg/dL — ABNORMAL HIGH (ref 70–99)

## 2024-07-18 LAB — CBC
HCT: 38.7 % — ABNORMAL LOW (ref 39.0–52.0)
Hemoglobin: 11.9 g/dL — ABNORMAL LOW (ref 13.0–17.0)
MCH: 29.5 pg (ref 26.0–34.0)
MCHC: 30.7 g/dL (ref 30.0–36.0)
MCV: 95.8 fL (ref 80.0–100.0)
Platelets: 197 10*3/uL (ref 150–400)
RBC: 4.04 MIL/uL — ABNORMAL LOW (ref 4.22–5.81)
RDW: 16.3 % — ABNORMAL HIGH (ref 11.5–15.5)
WBC: 9.3 10*3/uL (ref 4.0–10.5)
nRBC: 0 % (ref 0.0–0.2)

## 2024-07-18 LAB — ECHOCARDIOGRAM COMPLETE
AR max vel: 0.71 cm2
AV Area VTI: 0.89 cm2
AV Area mean vel: 0.76 cm2
AV Mean grad: 9.5 mmHg
AV Peak grad: 19.2 mmHg
Ao pk vel: 2.19 m/s
Area-P 1/2: 6.17 cm2
Height: 66 in
S' Lateral: 5 cm
Weight: 2141.11 [oz_av]

## 2024-07-18 LAB — PATHOLOGIST SMEAR REVIEW

## 2024-07-18 LAB — MAGNESIUM: Magnesium: 2.2 mg/dL (ref 1.7–2.4)

## 2024-07-18 LAB — APTT: aPTT: 27 s (ref 24–36)

## 2024-07-18 MED ORDER — ENSURE PLUS HIGH PROTEIN PO LIQD
237.0000 mL | Freq: Two times a day (BID) | ORAL | Status: DC
  Administered 2024-07-18 – 2024-07-22 (×7): 237 mL via ORAL

## 2024-07-18 MED ORDER — TAMSULOSIN HCL 0.4 MG PO CAPS
0.4000 mg | ORAL_CAPSULE | Freq: Every day | ORAL | Status: DC
  Administered 2024-07-18 – 2024-07-21 (×4): 0.4 mg via ORAL
  Filled 2024-07-18 (×5): qty 1

## 2024-07-18 MED ORDER — METOPROLOL TARTRATE 25 MG PO TABS
12.5000 mg | ORAL_TABLET | Freq: Two times a day (BID) | ORAL | Status: DC
  Administered 2024-07-18 (×2): 12.5 mg via ORAL
  Filled 2024-07-18 (×2): qty 1

## 2024-07-18 MED ORDER — LIDOCAINE HCL (PF) 1 % IJ SOLN
5.0000 mL | Freq: Once | INTRAMUSCULAR | Status: AC
  Administered 2024-07-18: 5 mL via INTRADERMAL
  Filled 2024-07-18: qty 5

## 2024-07-18 MED ORDER — HEPARIN (PORCINE) 25000 UT/250ML-% IV SOLN: 850.0000 [IU]/h | INTRAVENOUS | Status: DC

## 2024-07-18 MED ORDER — FINASTERIDE 5 MG PO TABS
5.0000 mg | ORAL_TABLET | Freq: Every day | ORAL | Status: DC
  Administered 2024-07-18 – 2024-07-21 (×4): 5 mg via ORAL
  Filled 2024-07-18 (×5): qty 1

## 2024-07-18 MED ORDER — APIXABAN 2.5 MG PO TABS: 2.5000 mg | ORAL_TABLET | Freq: Two times a day (BID) | ORAL | Status: DC

## 2024-07-18 MED ORDER — HEPARIN (PORCINE) 25000 UT/250ML-% IV SOLN
900.0000 [IU]/h | INTRAVENOUS | Status: AC
  Administered 2024-07-18: 850 [IU]/h via INTRAVENOUS
  Filled 2024-07-18: qty 250

## 2024-07-18 NOTE — Evaluation (Signed)
 Clinical/Bedside Swallow Evaluation Patient Details  Name: Gary Lynn MRN: [id number] Date of Birth: 04/15/1931  Today's Date: 07/18/2024 Time: SLP Start Time (ACUTE ONLY): 0900 SLP Stop Time (ACUTE ONLY): 1000 SLP Time Calculation (min) (ACUTE ONLY): 60 min  Past Medical History:  Past Medical History:  Diagnosis Date   Carotid artery stenosis    a. carotid doppler 05/2016: RICA 40-59%, LICA 1-39%   Chronic diastolic CHF (congestive heart failure) (HCC)    a. echo 05/2016: EF 55-60%, AK of the basal-midinferolateral and inferior myocardium (c/w prior study), GR1DD, aortic sclerosis without stenosis, mild MR, PASP 40 mmHg   Coronary artery disease    Cardiac cath in February of 2013: EF 60%, high-grade chronic disease in OM 1, occluded mid RCA at the site of a previously placed stent with left-to-right collaterals, occluded left SFA.   Diabetes mellitus with complication (HCC)    Hypercholesterolemia    Hypertension    PAF (paroxysmal atrial fibrillation) (HCC)    a. Zio monitor 05/2016: predominant rhyhtm of sinus with intermittent episodes of Afib/flutter with the longest episode being 6 hours and 51 minutes with an average heart rate of 114 bpm. Occasional PACs and PVCs. b. CHADS2VASc --> 6 (CHF, HTN, age x 2, DM, vascular disease)   Past Surgical History:  Past Surgical History:  Procedure Laterality Date   BACK SURGERY     years ago   CARDIAC CATHETERIZATION  07/2007   [facility]   CARDIAC CATHETERIZATION  07/2011   [facility]   CORONARY ANGIOPLASTY WITH STENT PLACEMENT     HPI:  Pt  is a 89 y.o. male with medical history significant of CAD s/p stenting, paroxysmal afib on xarelto , skin cancer status post radiation, cardiac catheterization in February of 2013 which showed an occluded RCA with left to right collaterals, high-grade complex disease, known history of hypertension, diabetes, PVCs, PAD and hyperlipidemia, who presents to the emergency department for evaluation of altered  mental status.  He is accompanied by his wife and another family member who provided majority of the history due to patient being hard of hearing.  Per his wife, he has been restless, unable to sleep, possibly having hallucinations for the past few days.  This morning he was yelling in pain, however when his wife asked him what is wrong, he said he did not know, so she called 911.      A few weeks ago, he saw his primary care provider for a cough and was prescribed azithromycin , Lasix , and Tessalon  Perles. Cough has improved somewhat but persistent.  He finished the azithromycin  a few days ago.   Family states that since he has been taking these new medications, they have noted increased confusion, left sided weakness, increased falls,  slurred speech, yellowing of his skin, and swelling in his lower extremities.They also note he recently finished radiation therapy from skin cancer.    Chest CT:  Diffuse airspace and interstitial disease throughout the lungs, more  prominent on the right, with air bronchograms, likely representing superimposed  edema or pneumonia, new since the prior study.  2. Large bilateral pleural effusions, new since the prior study.  3. Diffuse chronic interstitial fibrosis, chronic bronchitic changes, and  bullous emphysematous changes.    MRI: 9 mm acute ischemic nonhemorrhagic infarct involving the white  matter of the posterior right frontal centrum semi ovale.  2. Underlying age-related cerebral atrophy with mild chronic small  vessel ischemic disease, with a few small remote lacunar infarcts  about the  bilateral basal ganglia.  3. 9 mm T2 hyperintense lesion within the left parotid gland,  indeterminate, but could reflect a primary salivary neoplasm.   Post admit, pt was transferred to CCU for a higher level of care.  Per SLP note on 1/27, He is poorly alert/awake and not appropriate for BSE/assessment this morning.  MD consulted for NPO order until BSE can be completed.     Assessment / Plan / Recommendation  Clinical Impression   Pt seen for BSE this morning post improved State and feeling better today. BSE was put on hold yesterday d/t medical issues requiring transfer to the CCU for a higher level of care.  Pt was awake, verbal and conversed w/ this SLP and his family members in the room. He appeared significantly weak but followed basic instructions/tasks. He required FULL support to sit upright in bed for po intake. He was eager to have coffee. Family seemed pleased w/ his presentation today.  On [place] O2 2L; afebrile. WBC WNL.  OF NOTE: pt is d/t have a thoracentesis later today.  Pt appears to present w/ grossly functional oropharyngeal phase swallowing w/ No gross oropharyngeal phase dysphagia noted when following general aspiration precautions w/ the trials given; No overt neuromuscular deficits noted. Pt consumed po trials w/ No overt, clinical s/s of aspiration during the trials when following general aspiration precautions(drinking via CUP using small sips, slowly).  Pt appears at reduced risk for aspiration when following general aspiration precautions and when given Support w/ po's(supporting pt holding the Cup; supporting Upright positioning for drinking/eating, etc). However, pt does have challenging factors that could impact oropharyngeal swallowing to include: Edentulous today; also suspect impact on swallowing from Significantly Deconditioned status/State; fatigue; Advanced Age; illness/hospitalization w/ Pulmonary decline; dependency for support for sitting Upright for po and feeding when needed. These factors can increase risk for aspiration, dysphagia as well as decreased oral intake overall.   During po trials, pt consumed trial consistencies of thin liquids VIA CUP and Purees w/ no overt coughing, decline in vocal quality, nor change in respiratory presentation during/post trials. O2 sats remained 98% during/post. OF NOTE: Pt exhibited coughing 1x  when drinking quickly taking Larger sips- pt acknowledged this and self-corrected for the remainder of the session. Oral phase appeared [facility] w/ timely bolus management and control of bolus propulsion for A-P transfer for swallowing. Oral clearing achieved w/ trials -- no Solids were assessed this session d/t waiting for pt to wear his Dentures b/f assessing mastication and management of Solid food consistencies. OM Exam appeared [facility] w/ no unilateral weakness noted. Speech Clear, min low volume. Pt fed self w/ setup support -- Held Cup when drinking(which lessens risk for aspiration- this was thoroughly explained to the Family/Wife).   Recommend not wearing the Dentures today(practice w/ them later) and using a Pureed consistency diet w/ well-moistened foods; Thin liquids -- monitor to support, and pt should Hold Cup when drinking. Recommend general aspiration precautions to include Small sips Slowly; Rest Breaks during intake to calm any WOB; Sit Upright for all intake; Reduce Distractions at meals(talking too). Support feeding as needed for conservation of energy during meals. Pills CRUSHED vs WHOLE in Puree for safer, easier swallowing -- it was encouraged now and for D/C to the Family.   Education given on Pills in Puree; food consistencies and options; incentive spirometer; general aspiration precautions to pt and Family in room. ST services will monitor status w/ ongoing assessment of diet upgrade. NSG updated, agreed. MD updated.  Recommend Dietician f/u for support. Precautions posted in chart, room.  SLP Visit Diagnosis: Dysphagia, unspecified (R13.10) (Edentulous today; also suspect impact on swallowing from Significantly Deconditioned status/State; fatigue; Advanced Age; illness/hospitalization w/ Pulmonary decline; dependency for support for sitting Upright for po intake)    Aspiration Risk  Mild aspiration risk;Risk for inadequate nutrition/hydration (reduced when following general aspiration  precautions and given support)    Diet Recommendation   Thin;Dysphagia 1 (puree) (gravies) = a Pureed consistency diet w/ well-moistened foods; Thin liquids -- monitor to support, and pt should Hold Cup when drinking. Recommend general aspiration precautions to include Small sips Slowly; Rest Breaks during intake to calm any WOB; Sit Upright for all intake; Reduce Distractions at meals(talking too). Support feeding as needed for conservation of energy during meals.   Medication Administration: Whole meds with puree (vs need to Crush in puree)    Other Recommendations Recommended Consults:  (Dietician; Palliative following) Oral Care Recommendations: Oral care BID;Oral care before and after PO;Staff/trained caregiver to provide oral care     Swallow Evaluation Recommendations  See above   Assistance Recommended at Discharge  FULL  Functional Status Assessment Patient has had a recent decline in their functional status and demonstrates the ability to make significant improvements in function in a reasonable and predictable amount of time.  Frequency and Duration min 2x/week  2 weeks       Prognosis Prognosis for improved oropharyngeal function: Fair Barriers to Reach Goals:  (advanced age; deconditioning) Barriers/Prognosis Comment: Edentulous today; also suspect impact on swallowing from Significantly Deconditioned status/State; fatigue; Advanced Age; illness/hospitalization w/ Pulmonary decline; dependency for support for sitting Upright for po intake      Swallow Study   General Date of Onset: 07/16/24 HPI: Pt  is a 89 y.o. male with medical history significant of CAD s/p stenting, paroxysmal afib on xarelto , skin cancer status post radiation, cardiac catheterization in February of 2013 which showed an occluded RCA with left to right collaterals, high-grade complex disease, known history of hypertension, diabetes, PVCs, PAD and hyperlipidemia, who presents to the emergency department for  evaluation of altered mental status.  He is accompanied by his wife and another family member who provided majority of the history due to patient being hard of hearing.  Per his wife, he has been restless, unable to sleep, possibly having hallucinations for the past few days.  This morning he was yelling in pain, however when his wife asked him what is wrong, he said he did not know, so she called 911.      A few weeks ago, he saw his primary care provider for a cough and was prescribed azithromycin , Lasix , and Tessalon  Perles. Cough has improved somewhat but persistent.  He finished the azithromycin  a few days ago.   Family states that since he has been taking these new medications, they have noted increased confusion, left sided weakness, increased falls,  slurred speech, yellowing of his skin, and swelling in his lower extremities.They also note he recently finished radiation therapy from skin cancer.    Chest CT:  Diffuse airspace and interstitial disease throughout the lungs, more  prominent on the right, with air bronchograms, likely representing superimposed  edema or pneumonia, new since the prior study.  2. Large bilateral pleural effusions, new since the prior study.  3. Diffuse chronic interstitial fibrosis, chronic bronchitic changes, and  bullous emphysematous changes.    MRI: 9 mm acute ischemic nonhemorrhagic infarct involving the white  matter of the posterior  right frontal centrum semi ovale.  2. Underlying age-related cerebral atrophy with mild chronic small  vessel ischemic disease, with a few small remote lacunar infarcts  about the bilateral basal ganglia.  3. 9 mm T2 hyperintense lesion within the left parotid gland,  indeterminate, but could reflect a primary salivary neoplasm.   Post admit, pt was transferred to CCU for a higher level of care.  Per SLP note on 1/27, He is poorly alert/awake and not appropriate for BSE/assessment this morning.  MD consulted for NPO order until BSE can be  completed. Type of Study: Bedside Swallow Evaluation Previous Swallow Assessment: none Diet Prior to this Study: NPO (regular diet initially at admit but then made NPO d/t CCU transfer; regular diet at home) Temperature Spikes Noted: No (wbc 9.3) Respiratory Status: Nasal cannula (2L) History of Recent Intubation: No Behavior/Cognition: Alert;Cooperative;Pleasant mood;Distractible;Requires cueing (min HOH) Oral Cavity Assessment: Within Functional Limits Oral Care Completed by SLP: Yes Oral Cavity - Dentition: Edentulous (has Dentures and will practice wearing them later today w/ Family) Vision: Functional for self-feeding (w/ support) Self-Feeding Abilities: Able to feed self;Needs assist;Needs set up Patient Positioning: Upright in bed (FULL assistance for sitting upright for po intake) Baseline Vocal Quality: Normal;Low vocal intensity Volitional Cough: Strong;Congested Volitional Swallow: Able to elicit    Oral/Motor/Sensory Function Overall Oral Motor/Sensory Function: Within functional limits   Ice Chips Ice chips: Within functional limits Presentation: Spoon (fed; 3 trials)   Thin Liquid Thin Liquid: Within functional limits (using small sips slowly) Presentation: Cup;Self Fed (supported cup; 12+ trials) Other Comments: cough 1x w/ Larger, multiple sips- pt endorsed understanding of occurrence    Nectar Thick Nectar Thick Liquid: Not tested   Honey Thick Honey Thick Liquid: Not tested   Puree Puree: Within functional limits Presentation: Spoon;Self Fed (supported; 10+ trials)   Solid     Solid: Not tested         [name], MS, CCC-SLP Speech Language Pathologist Rehab Services; [facility] -  [phone] (ascom) Radames Mejorado 07/18/2024,4:52 PM

## 2024-07-18 NOTE — Progress Notes (Signed)
 "  Rounding Note   Patient Name: Gary Lynn Date of Encounter: 07/18/2024  [facility] Cardiologist: Deatrice Cage, MD   Subjective Resting in bed, alert, communicative, Family at the bedside including wife and 2 daughters Reports he was able to swallow a pill, small amount of liquid last night without difficulty -No pain  CT chest with bilateral pleural effusions   Scheduled Meds:   stroke: early stages of recovery book   Does not apply Once   atorvastatin   40 mg Oral Daily   Chlorhexidine  Gluconate Cloth  6 each Topical Daily   feeding supplement  237 mL Oral BID BM   finasteride   5 mg Oral Daily   guaiFENesin   600 mg Oral BID   hydrocortisone  sod succinate (SOLU-CORTEF ) inj  50 mg Intravenous Q6H   insulin  aspart  0-15 Units Subcutaneous TID WC   insulin  aspart  0-5 Units Subcutaneous QHS   ipratropium-albuterol   3 mL Nebulization BID   metoprolol  tartrate  12.5 mg Oral BID   nicotine   7 mg Transdermal Daily   tamsulosin   0.4 mg Oral Daily   Continuous Infusions:  piperacillin -tazobactam (ZOSYN )  IV Stopped (07/18/24 9376)   PRN Meds: acetaminophen  **OR** acetaminophen , metoprolol  tartrate, ondansetron  **OR** ondansetron  (ZOFRAN ) IV   Vital Signs  Vitals:   07/18/24 0849 07/18/24 0900 07/18/24 1000 07/18/24 1100  BP:  110/68 (!) 110/52 (!) 106/42  Pulse: 74   95  Resp: 13 (!) 25 (!) 26 (!) 25  Temp:      TempSrc:      SpO2: 100% 100% 100% 98%  Weight:      Height:        Intake/Output Summary (Last 24 hours) at 07/18/2024 1224 Last data filed at 07/18/2024 0700 Gross per 24 hour  Intake 150 ml  Output 1500 ml  Net -1350 ml      07/17/2024    9:00 AM 07/17/2024    5:00 AM 07/16/2024    1:54 PM  Last 3 Weights  Weight (lbs) 133 lb 13.1 oz 144 lb 6.4 oz 138 lb 14.2 oz  Weight (kg) 60.7 kg 65.5 kg 63 kg      Telemetry Normal sinus rhythm with paroxysmal atrial fibrillation episodes rates up to 120- Personally Reviewed  ECG   - Personally  Reviewed  Physical Exam  GEN: No acute distress.   Neck: No JVD Cardiac: RRR, no murmurs, rubs, or gallops.  Respiratory: Clear to auscultation bilaterally. GI: Soft, nontender, non-distended  MS: No edema; No deformity. Neuro:  Nonfocal  Psych: Normal affect   Labs High Sensitivity Troponin:  No results for input(s): TROPONINIHS in the last 720 hours.  Recent Labs  Lab 07/16/24 1339 07/16/24 1529  TRNPT 68* 56*       Chemistry Recent Labs  Lab 07/16/24 1339 07/16/24 1804 07/17/24 0537 07/18/24 0331  NA 140  --  142 144  K 4.4  --  4.3 4.3  CL 104  --  108 107  CO2 23  --  24 24  GLUCOSE 370*  --  198* 173*  BUN 66*  --  63* 63*  CREATININE 1.97*  --  1.82* 1.93*  CALCIUM  9.2  --  9.2 8.4*  MG  --   --  2.3 2.2  PROT  --  6.9 6.6  --   ALBUMIN  --  3.4* 3.2* 3.1*  AST  --  21 23  --   ALT  --  40 36  --  ALKPHOS  --  117 106  --   BILITOT  --  0.3 0.4  --   GFRNONAA 31*  --  35* 32*  ANIONGAP 14  --  10 13    Lipids  Recent Labs  Lab 07/17/24 0537  CHOL 89  TRIG 64  HDL 33*  LDLCALC 43  CHOLHDL 2.7    Hematology Recent Labs  Lab 07/16/24 1339 07/17/24 0537 07/18/24 0331  WBC 11.7* 9.6 9.3  RBC 4.27 4.08* 4.04*  HGB 12.3* 11.8* 11.9*  HCT 40.5 39.4 38.7*  MCV 94.8 96.6 95.8  MCH 28.8 28.9 29.5  MCHC 30.4 29.9* 30.7  RDW 16.3* 16.0* 16.3*  PLT 209 209 197   Thyroid   Recent Labs  Lab 07/17/24 1804  TSH 1.870    BNP Recent Labs  Lab 07/16/24 1339  PROBNP 29,741.0*    DDimer No results for input(s): DDIMER in the last 168 hours.   Radiology  MR BRAIN WO CONTRAST Result Date: 07/16/2024 CLINICAL DATA:  Initial evaluation for acute neuro deficit, stroke suspected. EXAM: MRI HEAD WITHOUT CONTRAST TECHNIQUE: Multiplanar, multiecho pulse sequences of the brain and surrounding structures were obtained without intravenous contrast. COMPARISON:  CT from earlier the same day. FINDINGS: Brain: Generalized age-related cerebral atrophy.  Patchy T2/FLAIR hyperintensity involving the periventricular and deep white matter, consistent with chronic small vessel ischemic disease, mild for age. Few small remote lacunar infarcts noted about the bilateral basal ganglia. 9 mm acute ischemic nonhemorrhagic infarct seen involving the white matter of the posterior right frontal centrum semi ovale (series 5, image 25). No significant mass effect. No other evidence for acute or subacute ischemia. No areas of chronic cortical infarction. No acute or chronic intracranial blood products. No mass lesion, midline shift or mass effect no hydrocephalus or extra-axial fluid collection. Pituitary gland within normal limits. Vascular: Major intracranial vascular flow voids are maintained. Hypoplastic right vertebral artery noted. Skull and upper cervical spine: Craniocervical junction within normal limits. Bone marrow signal intensity overall within normal limits. No scalp soft tissue abnormality. Sinuses/Orbits: Prior bilateral ocular lens replacement. Paranasal sinuses are clear. No mastoid effusion. 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 9 mm T2 hyperintense lesion within the left parotid gland (series 17, image 16), indeterminate, but could reflect a primary salivary neoplasm. IMPRESSION: 1. 9 mm acute ischemic nonhemorrhagic infarct involving the white matter of the posterior right frontal centrum semi ovale. 2. Underlying age-related cerebral atrophy with mild chronic small vessel ischemic disease, with a few small remote lacunar infarcts about the bilateral basal ganglia. 3. 9 mm T2 hyperintense lesion within the left parotid gland, indeterminate, but could reflect a primary salivary neoplasm. Electronically Signed   By: Morene Hoard M.D.   On: 07/16/2024 19:48   CT CHEST WO CONTRAST Result Date: 07/16/2024 EXAM: CT CHEST WITHOUT CONTRAST 07/16/2024 06:30:47 PM TECHNIQUE: CT of the chest was performed without the administration of  intravenous contrast. Multiplanar reformatted images are provided for review. Automated exposure control, iterative reconstruction, and/or weight based adjustment of the mA/kV was utilized to reduce the radiation dose to as low as reasonably achievable. COMPARISON: Chest radiograph 07/16/2024 and PET CT 04/10/2024. CLINICAL HISTORY: Pneumonia, complication suspected, x-ray done. FINDINGS: MEDIASTINUM: Heart and pericardium are unremarkable. The central airways are clear. Vascular calcifications. No aortic aneurysm. The esophagus is decompressed. LYMPH NODES: Scattered mediastinal lymph nodes. No hilar or axillary lymphadenopathy. LUNGS AND PLEURA: Large bilateral pleural effusions. Diffuse airspace and interstitial disease throughout the lungs, more  prominent on the right, and with air bronchograms. This likely represents superimposed edema or pneumonia. Diffuse interstitial fibrosis throughout the lungs with honeycomb changes. Bronchial wall thickening. Cystic structure in the right apex probably represents a large bulla surrounded by pleural effusion. Infiltrates and effusions are new since the prior study. No pneumothorax. SOFT TISSUES/BONES: Degenerative changes in the spine. No acute bony abnormalities. No acute abnormality of the soft tissues. UPPER ABDOMEN: Limited images of the upper abdomen demonstrates no acute abnormality. IMPRESSION: 1. Diffuse airspace and interstitial disease throughout the lungs, more prominent on the right, with air bronchograms, likely representing superimposed edema or pneumonia, new since the prior study. 2. Large bilateral pleural effusions, new since the prior study. 3. Diffuse chronic interstitial fibrosis, chronic bronchitic changes, and bullous emphysematous changes . Electronically signed by: Elsie Gravely MD 07/16/2024 06:41 PM EST RP Workstation: [id number]   CT Head Wo Contrast Result Date: 07/16/2024 EXAM: CT HEAD WITHOUT CONTRAST 07/16/2024 02:30:54 PM TECHNIQUE:  CT of the head was performed without the administration of intravenous contrast. Automated exposure control, iterative reconstruction, and/or weight based adjustment of the mA/kV was utilized to reduce the radiation dose to as low as reasonably achievable. COMPARISON: CT head 12/18/2017 and MRI brain 12/18/2017. CLINICAL HISTORY: 89 year old male with a week ago started new medications and was slurring his words since per family. Reports new left leg weakness. FINDINGS: BRAIN AND VENTRICLES: No acute hemorrhage. Redemonstrated bilateral chronic lacunar infarctions at the basal ganglia. No acute territorial infarction. There is overall similar mild generalized parenchymal volume loss. There is overall similar moderate scattered white matter hypodensities which are nonspecific but most commonly represent chronic microvascular ischemic changes. No hydrocephalus. No extra-axial collection. No mass effect or midline shift. ORBITS: Bilateral lens replacement. SINUSES: No acute abnormality. SOFT TISSUES AND SKULL: There is calcification of the small scalp vessels which can be seen in the setting of end-stage renal disease. No acute soft tissue abnormality. No skull fracture. VASCULATURE: Bilateral carotid calcifications. IMPRESSION: 1. No acute intracranial abnormality. 2. No substantial change since 12/18/2017. Electronically signed by: Prentice Spade MD 07/16/2024 03:31 PM EST RP Workstation: [id number]   DG Chest 2 View Result Date: 07/16/2024 EXAM: 2 VIEW(S) XRAY OF THE CHEST 07/16/2024 01:05:00 PM COMPARISON: 07/04/2024 CLINICAL HISTORY: Weakness. FINDINGS: LUNGS AND PLEURA: Low lung volumes. Worsening aeration to the right upper lobe. Progressive bilateral interstitial and alveolar opacities. Small bilateral pleural effusions, increased from prior study. . No pneumothorax. HEART AND MEDIASTINUM: Aortic atherosclerosis. No acute abnormality of the cardiac and mediastinal silhouettes. BONES AND SOFT TISSUES: Stable  sclerotic lesion in right humeral head. IMPRESSION: 1. Progressive right upper lobe consolidation and increased bilateral interstitial and alveolar opacities. 2. Small bilateral pleural effusions, increased from previous exam. Electronically signed by: Waddell Calk MD 07/16/2024 01:28 PM EST RP Workstation: [id number]    Cardiac Studies   Patient Profile   Mr. Nicasio is a 89 year old male with history of coronary artery disease status post PCI to the RCA, paroxysmal atrial fibrillation/flutter, chronic HFpEF, hypertension, hyperlipidemia, type 2 diabetes mellitus, and carotid artery disease, who we have been asked to see due to atrial fibrillation with rapid ventricular response in the setting of acute stroke and pneumonia   Plan: Atrial fibrillation with RVR -Originally diagnosed with paroxysmal atrial fibrillation in 2017 with intermittent A-fib and a flutter -As outpatient was taking Xarelto  15 mg daily CHA2DS2-VASc score of at least 6 for stroke prophylaxis -in clinic 07/13/2024 noted  in atypical atrial flutter with RVR, started on metoprolol   25 mg twice daily  -This admission again with paroxysmal atrial fibrillation/flutter -Anticoagulation held in the setting of acute stroke -Cleared by neurology to restart anticoagulation -Will recommend we start heparin  infusion with no bolus in case anticoagulation needs to be held for thoracentesis -Start metoprolol  tartrate 12.5 twice daily, dosing limited secondary to hypotension -not an ideal candidate for digoxin due to elevated kidney function -Consider amiodarone if rate is unable to be controlled -Echocardiogram pending   CVA right frontal small ischemic nonhemorrhagic stroke/altered mental status - Episodes of delirium and screaming,  Slurred speech noted in the past few weeks with increasing falls and weakness - CT of the head was unremarkable -MRI of the brain revealed 1.9 mm acute ischemic nonhemorrhagic infarct involving the white  matter of the posterior right frontal centrum semi ovale -Speech and swallow elevation pending - Wants thoracentesis bilaterally completed, will initiate Eliquis  2.5 twice daily, otherwise heparin  infusion for now -Ongoing management per neurology and IM   Acute hypoxic respiratory failure/severe right upper lobe pneumonia and bilateral large pleural effusions/acute on chronic HFpEF -BNP of greater than 29,000 -Increasing bilateral pleural effusions with progression of right upper lobe consolidation -Continued on Zosyn  and doxycycline  on steroids -Thoracentesis scheduled Echo pending   Elevated high-sensitivity troponin -High-sensitivity troponins trended 68 and 56 -Likely demand ischemia from atrial fibrillation and pneumonia -Not consistent with ACS Echo pending, no immediate plans for ischemic workup  AKI -Serum creatinine 1.82 -Baseline serum creatinine 1.2   CAD status post stenting -Without angina -Not on antiplatelet medication given anticoagulation - Continue atorvastatin  once taking pills   Hyperlipidemia -LDL 43 -on atorvastatin  40 mg daily   Hypotension -Longstanding history of orthostatic hypotension, may need to initiate midodrine if symptomatic  Type 2 diabetes -Continued on insulin  -Ongoing management per IM   For questions or updates, please contact [facility] Please consult [email or web address] for contact info under     Signed, Quindarius Cabello, MD  07/18/2024, 12:24 PM    "

## 2024-07-18 NOTE — Procedures (Signed)
 PROCEDURE SUMMARY:  Successful US  guided left-sided thoracentesis. Yielded of clear yellow fluid. Patient tolerated procedure well. No immediate complications. EBL = trace  Specimen sent for labs.  Post procedure chest X-ray reveals no pneumothorax  Severiano Utsey CHRISTELLA Bal PA-C 07/18/2024 12:58 PM

## 2024-07-18 NOTE — Plan of Care (Signed)
" °  Problem: Health Behavior/Discharge Planning: Goal: Ability to manage health-related needs will improve Outcome: Progressing   Problem: Clinical Measurements: Goal: Ability to maintain clinical measurements within normal limits will improve 07/18/2024 0456 by Merilee Shlomo Artemus DELENA, RN Outcome: Progressing 07/18/2024 0455 by Merilee Shlomo Artemus DELENA, RN Outcome: Progressing Goal: Diagnostic test results will improve Outcome: Progressing Goal: Cardiovascular complication will be avoided Outcome: Progressing   Problem: Nutrition: Goal: Adequate nutrition will be maintained Outcome: Not Progressing   Problem: Pain Managment: Goal: General experience of comfort will improve and/or be controlled Outcome: Progressing   Problem: Safety: Goal: Ability to remain free from injury will improve Outcome: Progressing   "

## 2024-07-18 NOTE — Plan of Care (Signed)
" °  Problem: Clinical Measurements: Goal: Ability to maintain clinical measurements within normal limits will improve Outcome: Progressing Goal: Cardiovascular complication will be avoided Outcome: Progressing   Problem: Nutrition: Goal: Adequate nutrition will be maintained Outcome: Not Progressing   Problem: Pain Managment: Goal: General experience of comfort will improve and/or be controlled Outcome: Progressing   Problem: Safety: Goal: Ability to remain free from injury will improve Outcome: Progressing   "

## 2024-07-18 NOTE — Progress Notes (Signed)
 [facility]  - [place] at [facility]   PATIENT NAME: Gary Lynn    MR#:  [id number]  DATE OF BIRTH:  09/15/1930  SUBJECTIVE:  seen patient earlier with Dr perla  Wife and  2 daughters at bedside. Came in with two week history of upper respiratory infection, weakness confusion and slurred speech. More alert and awake. No resp distress.   VITALS:  Blood pressure (!) 106/42, pulse 95, temperature 97.9 F (36.6 C), temperature source Axillary, resp. rate (!) 25, height 5' 6 (1.676 m), weight 60.7 kg, SpO2 98%.  PHYSICAL EXAMINATION:  limited GENERAL:  89 y.o.-year-old patient with acute distress. Sleepy but awakens on VC LUNGS: N decreased breath sounds bilaterally, no wheezing CARDIOVASCULAR: S1, S2 normal. No murmur tachycardia ABDOMEN: Soft, nontender, nondistended.  EXTREMITIES: ++ edema b/l.    NEUROLOGIC: weak, grossly nonfocal--moves all extremities well SKIN per RN  LABORATORY PANEL:  CBC Recent Labs  Lab 07/18/24 0331  WBC 9.3  HGB 11.9*  HCT 38.7*  PLT 197    Chemistries  Recent Labs  Lab 07/17/24 0537 07/18/24 0331  NA 142 144  K 4.3 4.3  CL 108 107  CO2 24 24  GLUCOSE 198* 173*  BUN 63* 63*  CREATININE 1.82* 1.93*  CALCIUM  9.2 8.4*  MG 2.3 2.2  AST 23  --   ALT 36  --   ALKPHOS 106  --   BILITOT 0.4  --    Cardiac Enzymes No results for input(s): TROPONINI in the last 168 hours. RADIOLOGY:  MR BRAIN WO CONTRAST Result Date: 07/16/2024 CLINICAL DATA:  Initial evaluation for acute neuro deficit, stroke suspected. EXAM: MRI HEAD WITHOUT CONTRAST TECHNIQUE: Multiplanar, multiecho pulse sequences of the brain and surrounding structures were obtained without intravenous contrast. COMPARISON:  CT from earlier the same day. FINDINGS: Brain: Generalized age-related cerebral atrophy. Patchy T2/FLAIR hyperintensity involving the periventricular and deep white matter, consistent with chronic small vessel ischemic disease, mild for age.  Few small remote lacunar infarcts noted about the bilateral basal ganglia. 9 mm acute ischemic nonhemorrhagic infarct seen involving the white matter of the posterior right frontal centrum semi ovale (series 5, image 25). No significant mass effect. No other evidence for acute or subacute ischemia. No areas of chronic cortical infarction. No acute or chronic intracranial blood products. No mass lesion, midline shift or mass effect no hydrocephalus or extra-axial fluid collection. Pituitary gland within normal limits. Vascular: Major intracranial vascular flow voids are maintained. Hypoplastic right vertebral artery noted. Skull and upper cervical spine: Craniocervical junction within normal limits. Bone marrow signal intensity overall within normal limits. No scalp soft tissue abnormality. Sinuses/Orbits: Prior bilateral ocular lens replacement. Paranasal sinuses are clear. No mastoid effusion. 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 9 mm T2 hyperintense lesion within the left parotid gland (series 17, image 16), indeterminate, but could reflect a primary salivary neoplasm. IMPRESSION: 1. 9 mm acute ischemic nonhemorrhagic infarct involving the white matter of the posterior right frontal centrum semi ovale. 2. Underlying age-related cerebral atrophy with mild chronic small vessel ischemic disease, with a few small remote lacunar infarcts about the bilateral basal ganglia. 3. 9 mm T2 hyperintense lesion within the left parotid gland, indeterminate, but could reflect a primary salivary neoplasm. Electronically Signed   By: Morene Hoard M.D.   On: 07/16/2024 19:48   CT CHEST WO CONTRAST Result Date: 07/16/2024 EXAM: CT CHEST WITHOUT CONTRAST 07/16/2024 06:30:47 PM TECHNIQUE: CT of the chest was performed without  the administration of intravenous contrast. Multiplanar reformatted images are provided for review. Automated exposure control, iterative reconstruction, and/or weight based  adjustment of the mA/kV was utilized to reduce the radiation dose to as low as reasonably achievable. COMPARISON: Chest radiograph 07/16/2024 and PET CT 04/10/2024. CLINICAL HISTORY: Pneumonia, complication suspected, x-ray done. FINDINGS: MEDIASTINUM: Heart and pericardium are unremarkable. The central airways are clear. Vascular calcifications. No aortic aneurysm. The esophagus is decompressed. LYMPH NODES: Scattered mediastinal lymph nodes. No hilar or axillary lymphadenopathy. LUNGS AND PLEURA: Large bilateral pleural effusions. Diffuse airspace and interstitial disease throughout the lungs, more prominent on the right, and with air bronchograms. This likely represents superimposed edema or pneumonia. Diffuse interstitial fibrosis throughout the lungs with honeycomb changes. Bronchial wall thickening. Cystic structure in the right apex probably represents a large bulla surrounded by pleural effusion. Infiltrates and effusions are new since the prior study. No pneumothorax. SOFT TISSUES/BONES: Degenerative changes in the spine. No acute bony abnormalities. No acute abnormality of the soft tissues. UPPER ABDOMEN: Limited images of the upper abdomen demonstrates no acute abnormality. IMPRESSION: 1. Diffuse airspace and interstitial disease throughout the lungs, more prominent on the right, with air bronchograms, likely representing superimposed edema or pneumonia, new since the prior study. 2. Large bilateral pleural effusions, new since the prior study. 3. Diffuse chronic interstitial fibrosis, chronic bronchitic changes, and bullous emphysematous changes . Electronically signed by: Elsie Gravely MD 07/16/2024 06:41 PM EST RP Workstation: [id number]   CT Head Wo Contrast Result Date: 07/16/2024 EXAM: CT HEAD WITHOUT CONTRAST 07/16/2024 02:30:54 PM TECHNIQUE: CT of the head was performed without the administration of intravenous contrast. Automated exposure control, iterative reconstruction, and/or weight  based adjustment of the mA/kV was utilized to reduce the radiation dose to as low as reasonably achievable. COMPARISON: CT head 12/18/2017 and MRI brain 12/18/2017. CLINICAL HISTORY: 89 year old male with a week ago started new medications and was slurring his words since per family. Reports new left leg weakness. FINDINGS: BRAIN AND VENTRICLES: No acute hemorrhage. Redemonstrated bilateral chronic lacunar infarctions at the basal ganglia. No acute territorial infarction. There is overall similar mild generalized parenchymal volume loss. There is overall similar moderate scattered white matter hypodensities which are nonspecific but most commonly represent chronic microvascular ischemic changes. No hydrocephalus. No extra-axial collection. No mass effect or midline shift. ORBITS: Bilateral lens replacement. SINUSES: No acute abnormality. SOFT TISSUES AND SKULL: There is calcification of the small scalp vessels which can be seen in the setting of end-stage renal disease. No acute soft tissue abnormality. No skull fracture. VASCULATURE: Bilateral carotid calcifications. IMPRESSION: 1. No acute intracranial abnormality. 2. No substantial change since 12/18/2017. Electronically signed by: Prentice Spade MD 07/16/2024 03:31 PM EST RP Workstation: [id number]   DG Chest 2 View Result Date: 07/16/2024 EXAM: 2 VIEW(S) XRAY OF THE CHEST 07/16/2024 01:05:00 PM COMPARISON: 07/04/2024 CLINICAL HISTORY: Weakness. FINDINGS: LUNGS AND PLEURA: Low lung volumes. Worsening aeration to the right upper lobe. Progressive bilateral interstitial and alveolar opacities. Small bilateral pleural effusions, increased from prior study. . No pneumothorax. HEART AND MEDIASTINUM: Aortic atherosclerosis. No acute abnormality of the cardiac and mediastinal silhouettes. BONES AND SOFT TISSUES: Stable sclerotic lesion in right humeral head. IMPRESSION: 1. Progressive right upper lobe consolidation and increased bilateral interstitial and alveolar  opacities. 2. Small bilateral pleural effusions, increased from previous exam. Electronically signed by: Waddell Calk MD 07/16/2024 01:28 PM EST RP Workstation: [id number]    Assessment and Plan  From H and p  BRIANA NEWMAN is a 89  y.o. male with medical history significant of CAD s/p stenting, paroxysmal afib on xarelto , skin cancer status post radiation, T2DM presents to the emergency department for evaluation of altered mental status.  He is accompanied by his wife and another family member who provided majority of the history due to patient being hard of hearing.  A few weeks ago, he saw his primary care provider for a cough and was prescribed azithromycin , Lasix , and Tessalon  Perles. Cough has improved somewhat but persistent.  He finished the azithromycin  a few days ago.   Family states that since he has been taking these new medications, they have noted increased confusion, left sided weakness, increased falls,  slurred speech, yellowing of his skin, and swelling in his lower extremities.They also note he recently finished radiation therapy from skin cancer.   At baseline uses 3 wheeled walker per wife. No falls at home per wife  AMS/acute metabolic encephalopathy Falls  acute CVA right frontal small ischemic non-hemorrhagic stroke - wife describes delirium, at the time of my encounter at baseline mental status.  slurred speech noted in the past few weeks with increasing falls and weakness, concerning for subacute CVA  CT head unremarkable.  - MRI brain 1. 9 mm acute ischemic nonhemorrhagic infarct involving the white matter of the posterior right frontal centrum semi ovale. 2. Underlying age-related cerebral atrophy with mild chronic small vessel ischemic disease, with a few small remote lacunar infarcts about the bilateral basal ganglia. 3. 9 mm T2 hyperintense lesion within the left parotid gland, indeterminate, but could reflect a primary salivary neoplasm. -- Neurology  consultation with Dr. Germaine. Since patient is NPO will use aspirin  per rectal at present. Will resume Xarelto  once able to tolerated PO  - PT/OT/SLP - fall and delerium precautions    Acute hypoxic respiratory failure  sepsis due to severe right upper lobe pneumonia bilateral large pleural effusion CHF acute on chronic diastolic--BNP 29,000  - with increasing b/l pleural effusions and progression of RUL consolidation  -start patient on Zosyn  and continue doxycycline  -- stress doses steroids with Solu-Medrol -- speech therapy to see patient --1/27--Left side Thoracentesis to be done by IR today and right side will be attempted tomorrow  AKI  (baseline creat 1.1 in feb 2025) in the setting of sepsis - BUN 66, Cr 1.97. Baseline Cr around 1, with peripheral edema and pleural effusions despite recently starting lasix  20mg  daily (per wife)  -   hold nephrotoxic agents  -- monitor input output  Elevated troponin - no CP. Suspect demand  - trend trop, monitor on telemetry  - ECHO    CAD s/p stenting  acute on chronic a fib with RVR  history of paroxysmal afib -[facility] cardiology consulted-- will start po eliquis  after bilateral thoracentesis is done, till then start IV heparin  gtt -- Low dose BB   HTN  - resume home medications as appropriate  - PRN hydralazine     T2DM  - SSI    Tobacco abuse  - nicotine  patch   Critically ill.  Palliative care consultation   Spoke with pt's dter and wife  and they request DNR/DNI  Procedures: Family communication : wife and daughters Consults : neurology, cardiology, IR CODE STATUS: full DVT Prophylaxis : heparin  gtt Level of care: Progressive Status is: Inpatient Remains inpatient appropriate because: sepsis due to pneumonia, a fib with RVR, CHF, AK I    TOTAL CRITICAL TIME TAKING CARE OF THIS PATIENT: 50 minutes.  >50% time spent on counselling and  coordination of care  Note: This dictation was prepared with Dragon dictation  along with smaller phrase technology. Any transcriptional errors that result from this process are unintentional.  Leita Blanch M.D    [facility]   CC: Primary care physician; Jadali, Fayegh, MD

## 2024-07-18 NOTE — Telephone Encounter (Signed)
 Pharmacy Patient Advocate Encounter  Insurance verification completed.    The patient is insured through [employer]/ [employer]. Patient has Medicare and is not eligible for a copay card, but may be able to apply for patient assistance or Medicare RX Payment Plan (Patient Must reach out to their plan, if eligible for payment plan), if available.    Ran test claim for Eliquis  5mg  tablet and the current 30 day co-pay is $62.24.   This test claim was processed through [facility]- copay amounts may vary at other pharmacies due to pharmacy/plan contracts, or as the patient moves through the different stages of their insurance plan.

## 2024-07-18 NOTE — Progress Notes (Signed)
*  PRELIMINARY RESULTS* Echocardiogram 2D Echocardiogram has been performed.  Gary Lynn 07/18/2024, 10:20 AM

## 2024-07-18 NOTE — Progress Notes (Signed)
 "                                                                                                                                                                                                          Daily Progress Note   Patient Name: Gary Lynn       Date: 07/18/2024 DOB: 1931-06-20  Age: 89 y.o. MRN#: [id number] Attending Physician: Tobie Calix, MD Primary Care Physician: Jadali, Fayegh, MD Admit Date: 07/16/2024  Reason for Consultation/Follow-up: Establishing goals of care  Subjective: Notes and labs reviewed. Following conversation yesterday, wife and family spoke with chaplain service and attending service and transitioned patient to DNR/DNI status.  Left thoracentesis completed today with 700 mL output.   In to see patient.  He is currently resting in bed at this time with eyes closed.  His wife and a daughter are at bedside.  His wife appears very upbeat and states he is doing much better than he was yesterday.  She discusses his going down for thoracentesis.  She discusses being told of plans to return tomorrow to drain the other lung.  She is very hopeful for his continued improvement.    Nursing in to bedside to provide medication.  Upon raising his head of bed, he opened his eyes spontaneously and said hello to me.  Patient coughing with water intake.  Message sent to SLP to update upon departure from room.  Patient and family deny questions or concerns at this time.  PMT will continue to follow.  Length of Stay: 2  Current Medications: Scheduled Meds:    stroke: early stages of recovery book   Does not apply Once   atorvastatin   40 mg Oral Daily   Chlorhexidine  Gluconate Cloth  6 each Topical Daily   feeding supplement  237 mL Oral BID BM   finasteride   5 mg Oral Daily   guaiFENesin   600 mg Oral BID   hydrocortisone  sod succinate (SOLU-CORTEF ) inj  50 mg Intravenous Q6H   insulin  aspart  0-15 Units Subcutaneous TID WC   insulin  aspart  0-5 Units Subcutaneous QHS    ipratropium-albuterol   3 mL Nebulization BID   metoprolol  tartrate  12.5 mg Oral BID   nicotine   7 mg Transdermal Daily   tamsulosin   0.4 mg Oral Daily    Continuous Infusions:  heparin      piperacillin -tazobactam (ZOSYN )  IV 3.375 g (07/18/24 1341)    PRN Meds: acetaminophen  **OR** acetaminophen , metoprolol  tartrate, ondansetron  **OR** ondansetron  (ZOFRAN ) IV  Physical Exam Pulmonary:     Effort: Pulmonary  effort is normal.     Comments: Moist cough Skin:    General: Skin is warm and dry.  Neurological:     Mental Status: He is alert.             Vital Signs: BP 113/63   Pulse 86   Temp 98 F (36.7 C) (Axillary)   Resp 18   Ht 5' 6 (1.676 m)   Wt 60.7 kg   SpO2 100%   BMI 21.60 kg/m  SpO2: SpO2: 100 % O2 Device: O2 Device: Nasal Cannula O2 Flow Rate: O2 Flow Rate (L/min): 2 L/min  Intake/output summary:  Intake/Output Summary (Last 24 hours) at 07/18/2024 1432 Last data filed at 07/18/2024 1341 Gross per 24 hour  Intake 390 ml  Output 1650 ml  Net -1260 ml   LBM: Last BM Date : 07/15/24 Baseline Weight: Weight: 63.6 kg Most recent weight: Weight: 60.7 kg   Patient Active Problem List   Diagnosis Date Noted   AKI (acute kidney injury) 07/18/2024   Pleural effusion on right 07/18/2024   Weakness 07/18/2024   Status post thoracentesis 07/18/2024   Sepsis with acute renal failure (HCC) 07/17/2024   Acute metabolic encephalopathy 07/17/2024   Pressure injury of skin 07/17/2024   Atrial fibrillation with rapid ventricular response (HCC) 07/17/2024   Pneumonia of both lungs due to infectious organism 07/16/2024   Encounter to establish care 03/29/2024   Orthostatic hypotension 03/29/2024   Acute stroke due to ischemia [facility])    TIA (transient ischemic attack) 12/18/2017   Paroxysmal atrial fibrillation (HCC)    Chronic diastolic CHF (congestive heart failure) (HCC)    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  Code Status:    Code Status Orders  (From admission, onward)           Start     Ordered   07/17/24 1818  Do not attempt resuscitation (DNR)- Limited -Do Not Intubate (DNI)  (Code Status)  Continuous       Question Answer Comment  If pulseless and not breathing No CPR or chest compressions.   In Pre-Arrest Conditions (Patient Is Breathing and Has A Pulse) Do not intubate. Provide all appropriate non-invasive medical interventions. Avoid ICU transfer unless indicated or required.   Consent: Discussion documented in EHR or advanced directives reviewed      07/17/24 1817           Code Status History     Date Active Date Inactive Code Status Order ID Comments User Context   07/16/2024 1723 07/17/2024 1817 Full Code [id number]  Collie Daved BROCKS, DO ED   12/18/2017 1750 12/19/2017 1953 Full Code [id number]  Delane Lye, DO Inpatient       Thank you for allowing the Palliative Medicine Team to assist in the care of this patient.   Time In: 1:35 Time Out: 2:00 Total Time 25 min Prolonged Time Billed  no       Greater than 50%  of this time was spent counseling and coordinating care related to the above assessment and plan.  Camelia Lewis, NP  Please contact Palliative Medicine Team phone at [phone] for questions and concerns.       "

## 2024-07-18 NOTE — Consult Note (Signed)
 PHARMACY - ANTICOAGULATION CONSULT NOTE  Pharmacy Consult for IV Heparin  Indication: atrial fibrillation  Allergies[1]  Patient Measurements: Height: 5' 6 (167.6 cm) Weight: 60.7 kg (133 lb 13.1 oz) IBW/kg (Calculated) : 63.8 HEPARIN  DW (KG): 60.7  Labs: Recent Labs    07/16/24 1339 07/17/24 0537 07/18/24 0331  HGB 12.3* 11.8* 11.9*  HCT 40.5 39.4 38.7*  PLT 209 209 197  LABPROT 19.9* 28.0*  --   INR 1.6* 2.5*  --   CREATININE 1.97* 1.82* 1.93*    Estimated Creatinine Clearance: 21.4 mL/min (A) (by C-G formula based on SCr of 1.93 mg/dL (H)).   Medical History: Past Medical History:  Diagnosis Date   Carotid artery stenosis    a. carotid doppler 05/2016: RICA 40-59%, LICA 1-39%   Chronic diastolic CHF (congestive heart failure) (HCC)    a. echo 05/2016: EF 55-60%, AK of the basal-midinferolateral and inferior myocardium (c/w prior study), GR1DD, aortic sclerosis without stenosis, mild MR, PASP 40 mmHg   Coronary artery disease    Cardiac cath in February of 2013: EF 60%, high-grade chronic disease in OM 1, occluded mid RCA at the site of a previously placed stent with left-to-right collaterals, occluded left SFA.   Diabetes mellitus with complication (HCC)    Hypercholesterolemia    Hypertension    PAF (paroxysmal atrial fibrillation) (HCC)    a. Zio monitor 05/2016: predominant rhyhtm of sinus with intermittent episodes of Afib/flutter with the longest episode being 6 hours and 51 minutes with an average heart rate of 114 bpm. Occasional PACs and PVCs. b. CHADS2VASc --> 6 (CHF, HTN, age x 2, DM, vascular disease)    Medications:  Xarelto  15 mg prior to admission. Last reported dose was 07/15/24  Assessment: 89 y/o M with medical history including Afib admitted with respiratory failure secondary to pneumonia and bilateral pleural effusions and stroke. Pharmacy consulted to dose heparin  for Afib.  Baseline INR elevated at 2.5 likely secondary to Xarelto . Baseline  aPTT pending. Anticipate elevated baseline anti-Xa level.  Plan is to put patient on Eliquis  after completion of procedures. Pending right-sided thoracentesis 07/19/24.   Goal of Therapy:  Heparin  level 0.3 - 0.5 units/ml aPTT 66 - 85 seconds Monitor platelets by anticoagulation protocol: Yes   Plan:  --Start heparin  infusion at 850 units/hr, no bolus. Will follow no bolus / stroke protocol given recent CVA --Check aPTT and HL 8 hours from initiation --Daily CBC per protocol while on IV heparin   Marolyn KATHEE Mare 07/18/2024,1:46 PM    [1] No Known Allergies

## 2024-07-19 ENCOUNTER — Inpatient Hospital Stay

## 2024-07-19 ENCOUNTER — Ambulatory Visit: Admitting: Cardiology

## 2024-07-19 DIAGNOSIS — I4891 Unspecified atrial fibrillation: Secondary | ICD-10-CM | POA: Diagnosis not present

## 2024-07-19 DIAGNOSIS — I350 Nonrheumatic aortic (valve) stenosis: Secondary | ICD-10-CM

## 2024-07-19 DIAGNOSIS — Z9889 Other specified postprocedural states: Secondary | ICD-10-CM | POA: Diagnosis not present

## 2024-07-19 DIAGNOSIS — I420 Dilated cardiomyopathy: Secondary | ICD-10-CM

## 2024-07-19 DIAGNOSIS — R531 Weakness: Secondary | ICD-10-CM

## 2024-07-19 DIAGNOSIS — I639 Cerebral infarction, unspecified: Secondary | ICD-10-CM | POA: Diagnosis not present

## 2024-07-19 DIAGNOSIS — I5020 Unspecified systolic (congestive) heart failure: Secondary | ICD-10-CM | POA: Diagnosis not present

## 2024-07-19 DIAGNOSIS — J189 Pneumonia, unspecified organism: Secondary | ICD-10-CM | POA: Diagnosis not present

## 2024-07-19 DIAGNOSIS — N179 Acute kidney failure, unspecified: Secondary | ICD-10-CM | POA: Diagnosis not present

## 2024-07-19 DIAGNOSIS — G9341 Metabolic encephalopathy: Secondary | ICD-10-CM | POA: Diagnosis not present

## 2024-07-19 DIAGNOSIS — I480 Paroxysmal atrial fibrillation: Secondary | ICD-10-CM | POA: Diagnosis not present

## 2024-07-19 DIAGNOSIS — J90 Pleural effusion, not elsewhere classified: Secondary | ICD-10-CM | POA: Diagnosis not present

## 2024-07-19 LAB — PROTEIN, BODY FLUID (OTHER): Total Protein, Body Fluid Other: 1.5 g/dL

## 2024-07-19 LAB — LD, BODY FLUID (OTHER): LD, Body Fluid: 87 [IU]/L

## 2024-07-19 LAB — GLUCOSE, CAPILLARY
Glucose-Capillary: 231 mg/dL — ABNORMAL HIGH (ref 70–99)
Glucose-Capillary: 370 mg/dL — ABNORMAL HIGH (ref 70–99)
Glucose-Capillary: 350 mg/dL — ABNORMAL HIGH (ref 70–99)
Glucose-Capillary: 138 mg/dL — ABNORMAL HIGH (ref 70–99)

## 2024-07-19 LAB — CBC
HCT: 36.9 % — ABNORMAL LOW (ref 39.0–52.0)
Hemoglobin: 11.3 g/dL — ABNORMAL LOW (ref 13.0–17.0)
MCH: 29.2 pg (ref 26.0–34.0)
MCHC: 30.6 g/dL (ref 30.0–36.0)
MCV: 95.3 fL (ref 80.0–100.0)
Platelets: 194 10*3/uL (ref 150–400)
RBC: 3.87 MIL/uL — ABNORMAL LOW (ref 4.22–5.81)
RDW: 16.4 % — ABNORMAL HIGH (ref 11.5–15.5)
WBC: 14.1 10*3/uL — ABNORMAL HIGH (ref 4.0–10.5)
nRBC: 0 % (ref 0.0–0.2)

## 2024-07-19 LAB — BODY FLUID CELL COUNT WITH DIFFERENTIAL
Eos, Fluid: 0 %
Lymphs, Fluid: 78 %
Monocyte-Macrophage-Serous Fluid: 4 %
Neutrophil Count, Fluid: 18 %
Total Nucleated Cell Count, Fluid: 438 uL

## 2024-07-19 LAB — APTT: aPTT: 56 s — ABNORMAL HIGH (ref 24–36)

## 2024-07-19 LAB — GLUCOSE, BODY FLUID OTHER: Glucose, Body Fluid Other: 195 mg/dL

## 2024-07-19 LAB — HEPARIN LEVEL (UNFRACTIONATED): Heparin Unfractionated: 0.41 [IU]/mL (ref 0.30–0.70)

## 2024-07-19 MED ORDER — APIXABAN 2.5 MG PO TABS
2.5000 mg | ORAL_TABLET | Freq: Two times a day (BID) | ORAL | Status: DC
  Administered 2024-07-20 – 2024-07-21 (×4): 2.5 mg via ORAL
  Filled 2024-07-19 (×4): qty 1

## 2024-07-19 MED ORDER — HYDROCORTISONE SOD SUC (PF) 100 MG IJ SOLR
50.0000 mg | Freq: Every day | INTRAMUSCULAR | Status: DC
  Administered 2024-07-20: 50 mg via INTRAVENOUS
  Filled 2024-07-19: qty 1

## 2024-07-19 MED ORDER — HEPARIN BOLUS VIA INFUSION
900.0000 [IU] | Freq: Once | INTRAVENOUS | Status: DC
  Filled 2024-07-19: qty 900

## 2024-07-19 MED ORDER — METOPROLOL TARTRATE 25 MG PO TABS
25.0000 mg | ORAL_TABLET | Freq: Two times a day (BID) | ORAL | Status: DC
  Administered 2024-07-19: 25 mg via ORAL
  Filled 2024-07-19: qty 1

## 2024-07-19 MED ORDER — METOPROLOL TARTRATE 25 MG PO TABS
25.0000 mg | ORAL_TABLET | Freq: Four times a day (QID) | ORAL | Status: DC
  Administered 2024-07-19 – 2024-07-21 (×9): 25 mg via ORAL
  Filled 2024-07-19 (×10): qty 1

## 2024-07-19 MED ORDER — IPRATROPIUM-ALBUTEROL 0.5-2.5 (3) MG/3ML IN SOLN: 3.0000 mL | Freq: Four times a day (QID) | RESPIRATORY_TRACT | Status: DC | PRN

## 2024-07-19 MED ORDER — LIDOCAINE HCL (PF) 1 % IJ SOLN
10.0000 mL | Freq: Once | INTRAMUSCULAR | Status: AC
  Administered 2024-07-19: 10 mL via INTRADERMAL
  Filled 2024-07-19: qty 10

## 2024-07-19 NOTE — Plan of Care (Signed)
 PMT up to see patient. He is currently out of room and off unit for procedure. PMT will follow up at a later time.

## 2024-07-19 NOTE — TOC Initial Note (Signed)
 Transition of Care [facility]) - Initial/Assessment Note    Patient Details  Name: Gary Lynn MRN: [id number] Date of Birth: [birth year]  Transition of Care [facility]) CM/SW Contact:    Gary JINNY Ruts, Gary Lynn Phone Number: 07/19/2024, 11:51 AM  Clinical Narrative:                 Chart reviewed. I was able to speak with the patient wife and daughters at bedside. I introduced myself, my role, and reason for visit. The patient was delayed in responses. I was able to consult with the wife and children while in the room.  The patient wife reports that the patient PCP is Dr. Carin. The patient wife reports that she lives in the home with the patient and takes care of him. The patient wife reports that she does have a lot of support from her children in and out side of the home.   The patient wife reports that the patient is pretty independent in the home. The patient wife reports that the children drives the patient to medical appointments. The patient wife reports that the children will assist the patient at D/C. The patient wife reports that the patient uses Psychologist, forensic.   The patient wife reports that the patient has had HH in 2017 and it was called [facility]. The patient wife reports that the patient has never been in a SNF and will not go into one. The patient wife reports that the patient has a BSC, walker, and cane in the home.   The patient wife reports that she will need help bathing the patient but she is sure that her children will assist her with this.   There are no ICM needs at this time.    Barriers to Discharge: Continued Medical Work up   Patient Goals and CMS Choice            Expected Discharge Plan and Services                                              Prior Living Arrangements/Services   Lives with:: Spouse Patient language and need for interpreter reviewed:: Yes        Need for Family Participation in Patient Care: Yes (Comment)     Criminal  Activity/Legal Involvement Pertinent to Current Situation/Hospitalization: No - Comment as needed  Activities of Daily Living   ADL Screening (condition at time of admission) Is the patient deaf or have difficulty hearing?: No Does the patient have difficulty seeing, even when wearing glasses/contacts?: No Does the patient have difficulty concentrating, remembering, or making decisions?: No  Permission Sought/Granted   Permission granted to share information with : Yes, Release of Information Signed  Share Information with NAME: Gary Lynn     Permission granted to share info w Relationship: Spouse  Permission granted to share info w Contact Information: [phone]  Emotional Assessment Appearance:: Appears stated age Attitude/Demeanor/Rapport: Unable to Assess Affect (typically observed): Unable to Assess   Alcohol / Substance Use: Not Applicable Psych Involvement: No (comment)  Admission diagnosis:  Weakness [R53.1] CAP (community acquired pneumonia) [J18.9] AKI (acute kidney injury) [N17.9] Pneumonia of both lungs due to infectious organism, unspecified part of lung [J18.9] Patient Active Problem List   Diagnosis Date Noted   AKI (acute kidney injury) 07/18/2024   Pleural effusion on right 07/18/2024  Weakness 07/18/2024   Status post thoracentesis 07/18/2024   Sepsis with acute renal failure (HCC) 07/17/2024   Acute metabolic encephalopathy 07/17/2024   Pressure injury of skin 07/17/2024   Atrial fibrillation with rapid ventricular response (HCC) 07/17/2024   Pneumonia of both lungs due to infectious organism 07/16/2024   Encounter to establish care 03/29/2024   Orthostatic hypotension 03/29/2024   Acute stroke due to ischemia [facility])    TIA (transient ischemic attack) 12/18/2017   Paroxysmal atrial fibrillation (HCC)    Chronic diastolic CHF (congestive heart failure) (HCC)    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   Other specified causes  of urethral stricture 01/16/2015   Nocturia 01/16/2015   Essential hypertension 01/13/2015   PVC's (premature ventricular contractions) 03/19/2013   Carotid artery stenosis    Coronary artery disease    PCP:  Gary Bright, Gary Lynn Pharmacy:   [facility] - [place], [place] - [address] [place] [address] Phone: [phone] Fax: [phone]  [facility] [address], [place] - [address] [place] [address] Phone: [phone] Fax: [phone]     Social Drivers of Health (SDOH) Social History: SDOH Screenings   Food Insecurity: No Food Insecurity (07/16/2024)  Housing: Low Risk (07/16/2024)  Transportation Needs: No Transportation Needs (07/16/2024)  Utilities: Not At Risk (07/16/2024)  Depression (PHQ2-9): Low Risk (07/11/2024)  Social Connections: Moderately Isolated (07/16/2024)  Tobacco Use: High Risk (07/16/2024)   SDOH Interventions:     Readmission Risk Interventions     No data to display

## 2024-07-19 NOTE — Procedures (Signed)
 PROCEDURE SUMMARY:  Successful US  guided right sided thoracentesis. Yielded of amber fluid. Patient tolerated procedure well. No immediate complications. EBL = trace  Specimen sent for labs.  Post procedure chest X-ray reveals no pneumothorax  Gary Lynn CHRISTELLA Bal PA-C 07/19/2024 2:45 PM

## 2024-07-19 NOTE — Progress Notes (Signed)
 [facility]  - [place] [facility]   PATIENT NAME: Gary Lynn    MR#:  [id number]  DATE OF BIRTH:  Jun 07, 1931  SUBJECTIVE:  Patient was seen and examined today.  Feeling weak.  Eating okay.  Awaiting second thoracentesis today.  Family at bedside.  VITALS:  Blood pressure 108/69, pulse 84, temperature 97.9 F (36.6 C), temperature source Axillary, resp. rate (!) 29, height 5' 6 (1.676 m), weight 60.7 kg, SpO2 (!) 87%.  PHYSICAL EXAMINATION:   General.  Frail and malnourished elderly man, in no acute distress.  Hard of hearing Pulmonary.  Lungs clear bilaterally, normal respiratory effort.  Decreased breath sounds at bases CV.  Irregularly irregular Abdomen.  Soft, nontender, nondistended, BS positive. CNS.  Alert and oriented .  No focal neurologic deficit. Extremities.  No edema, no cyanosis, pulses intact and symmetrical.   LABORATORY PANEL:  CBC Recent Labs  Lab 07/19/24 0115  WBC 14.1*  HGB 11.3*  HCT 36.9*  PLT 194    Chemistries  Recent Labs  Lab 07/17/24 0537 07/18/24 0331  NA 142 144  K 4.3 4.3  CL 108 107  CO2 24 24  GLUCOSE 198* 173*  BUN 63* 63*  CREATININE 1.82* 1.93*  CALCIUM  9.2 8.4*  MG 2.3 2.2  AST 23  --   ALT 36  --   ALKPHOS 106  --   BILITOT 0.4  --    Cardiac Enzymes No results for input(s): TROPONINI in the last 168 hours. RADIOLOGY:  ECHOCARDIOGRAM COMPLETE Result Date: 07/18/2024    ECHOCARDIOGRAM REPORT   Patient Name:   Gary Lynn [facility] Date of Exam: 07/18/2024 Medical Rec #:  [id number]      Height:       66.0 in Accession #:    [id number]     Weight:       133.8 lb Date of Birth:  Apr 19, 1931     BSA:          1.686 m Patient Age:    89 years       BP:           109/50 mmHg Patient Gender: M              HR:           80 bpm. Exam Location:  [facility] Procedure: 2D Echo, Cardiac Doppler and Color Doppler (Both Spectral and Color            Flow Doppler were utilized during procedure). Indications:     Atrial  Fibrillation I48.91  History:         Patient has prior history of Echocardiogram examinations, most                  recent 06/02/2016. Risk Factors:Diabetes and Hypertension.                  Paroxysmal Afib.  Sonographer:     Christopher Furnace Referring Phys:  [id number] DAVED BROCKS [facility] Diagnosing Phys: Evalene Lunger MD IMPRESSIONS  1. Left ventricular ejection fraction, by estimation, is 25 to 30%. The left ventricle has severely decreased function. The left ventricle demonstrates global hypokinesis with severe inferior wall hypokinesis. Left ventricular diastolic parameters are indeterminate.  2. Right ventricular systolic function is normal. The right ventricular size is normal.  3. Moderate pleural effusion in the left lateral region.  4. The mitral valve is normal in structure. Mild mitral valve regurgitation. No evidence of mitral stenosis.  5. The aortic valve is calcified. Aortic valve regurgitation is not visualized. Suspect low flow, low gradient, Severe aortic valve stenosis. Aortic valve area, by VTI measures 0.89 cm.  6. The inferior vena cava is normal in size with greater than 50% respiratory variability, suggesting right atrial pressure of 3 mmHg. FINDINGS  Left Ventricle: Left ventricular ejection fraction, by estimation, is 25 to 30%. The left ventricle has severely decreased function. The left ventricle demonstrates global hypokinesis. Strain was performed and the global longitudinal strain is indeterminate. The left ventricular internal cavity size was normal in size. There is no left ventricular hypertrophy. Left ventricular diastolic parameters are indeterminate. Right Ventricle: The right ventricular size is normal. No increase in right ventricular wall thickness. Right ventricular systolic function is normal. Left Atrium: Left atrial size was normal in size. Right Atrium: Right atrial size was normal in size. Pericardium: There is no evidence of pericardial effusion. Mitral Valve: The mitral valve  is normal in structure. Mild mitral valve regurgitation. No evidence of mitral valve stenosis. Tricuspid Valve: The tricuspid valve is normal in structure. Tricuspid valve regurgitation is not demonstrated. No evidence of tricuspid stenosis. Aortic Valve: The aortic valve is calcified. Aortic valve regurgitation is not visualized. Severe aortic stenosis is present. Aortic valve mean gradient measures 9.5 mmHg. Aortic valve peak gradient measures 19.2 mmHg. Aortic valve area, by VTI measures 0.89 cm. Pulmonic Valve: The pulmonic valve was normal in structure. Pulmonic valve regurgitation is not visualized. No evidence of pulmonic stenosis. Aorta: The aortic root is normal in size and structure. Venous: The inferior vena cava is normal in size with greater than 50% respiratory variability, suggesting right atrial pressure of 3 mmHg. IAS/Shunts: No atrial level shunt detected by color flow Doppler. Additional Comments: 3D was performed not requiring image post processing on an independent workstation and was indeterminate. There is a moderate pleural effusion in the left lateral region.  LEFT VENTRICLE PLAX 2D LVIDd:         5.30 cm LVIDs:         5.00 cm LV PW:         0.90 cm LV IVS:        1.20 cm LVOT diam:     2.00 cm LV SV:         37 LV SV Index:   22 LVOT Area:     3.14 cm  RIGHT VENTRICLE RV Basal diam:  4.70 cm RV Mid diam:    3.50 cm LEFT ATRIUM           Index        RIGHT ATRIUM           Index LA diam:      3.00 cm 1.78 cm/m   RA Area:     17.40 cm LA Vol (A2C): 19.8 ml 11.74 ml/m  RA Volume:   49.20 ml  29.18 ml/m LA Vol (A4C): 22.3 ml 13.23 ml/m  AORTIC VALVE AV Area (Vmax):    0.71 cm AV Area (Vmean):   0.76 cm AV Area (VTI):     0.89 cm AV Vmax:           219.25 cm/s AV Vmean:          138.250 cm/s AV VTI:            0.414 m AV Peak Grad:      19.2 mmHg AV Mean Grad:      9.5 mmHg LVOT Vmax:  49.50 cm/s LVOT Vmean:        33.500 cm/s LVOT VTI:          0.117 m LVOT/AV VTI ratio: 0.28   AORTA Ao Root diam: 3.40 cm MITRAL VALVE MV Area (PHT): 6.17 cm    SHUNTS MV Decel Time: 123 msec    Systemic VTI:  0.12 m MV E velocity: 86.50 cm/s  Systemic Diam: 2.00 cm Evalene Lunger MD Electronically signed by Evalene Lunger MD Signature Date/Time: 07/18/2024/4:04:45 PM    Final    US  THORACENTESIS LEFT ASP PLEURAL SPACE W/IMG GUIDE Result Date: 07/18/2024 INDICATION: 89 year old male currently admitted with acute hypoxic respiratory failure and sepsis secondary to severe right upper lobe pneumonia. Imaging demonstrated bilateral pleural effusions. Request for diagnostic and therapeutic left-sided thoracentesis. EXAM: ULTRASOUND GUIDED DIAGNOSTIC AND THERAPEUTIC, LEFT-SIDED THORACENTESIS MEDICATIONS: 1% lidocaine , 5 mL. COMPLICATIONS: None immediate. PROCEDURE: An ultrasound guided thoracentesis was thoroughly discussed with the patient and questions answered. The benefits, risks, alternatives and complications were also discussed. The patient understands and wishes to proceed with the procedure. Written consent was obtained. Ultrasound was performed to localize and mark an adequate pocket of fluid in the left chest. The area was then prepped and draped in the normal sterile fashion. 1% Lidocaine  was used for local anesthesia. Under ultrasound guidance a 6 Fr Safe-T-Centesis catheter was introduced. Thoracentesis was performed. The catheter was removed and a dressing applied. FINDINGS: A total of approximately 700 mL of clear yellow fluid was removed. Samples were sent to the laboratory as requested by the clinical team. IMPRESSION: Successful ultrasound guided left-sided thoracentesis yielding 700 mL of pleural fluid. Procedure performed by: Sherrilee Bal, PA-C under the supervision Dr. DELENA Balder Electronically Signed   By: Juliene Balder M.D.   On: 07/18/2024 15:33   DG Chest Port 1 View Result Date: 07/18/2024 EXAM: 1 VIEW(S) XRAY OF THE CHEST 07/18/2024 11:46:00 AM COMPARISON: 07/16/2024 CLINICAL  HISTORY: Shortness of breath. Status post thoracentesis. FINDINGS: LUNGS AND PLEURA: Low lung volumes. Masslike opacity in right upper lobe. Similar small right pleural effusion. Hazy opacities in right lung. Background chronic interstitial lung disease. Hazy opacities in left lung. No pneumothorax. HEART AND MEDIASTINUM: Aortic atherosclerosis. No acute abnormality of the cardiac and mediastinal silhouettes. BONES AND SOFT TISSUES: No acute osseous abnormality. IMPRESSION: 1. Hazy opacities in right and left lungs, which may reflect interstitial edema versus atypical infection. . 2. Unchanged mass-like opacity in right upper lobe. 3. Similar small right pleural effusion. 4. Decreased volume of left pleural effusion with improved aeration to the left lung base. Electronically signed by: Waddell Calk MD 07/18/2024 12:27 PM EST RP Workstation: [facility] course.  From H and p  Gary Lynn is a 89 y.o. male with medical history significant of CAD s/p stenting, paroxysmal afib on xarelto , skin cancer status post radiation, T2DM presents to the emergency department for evaluation of altered mental status.  He is accompanied by his wife and another family member who provided majority of the history due to patient being hard of hearing.  A few weeks ago, he saw his primary care provider for a cough and was prescribed azithromycin , Lasix , and Tessalon  Perles. Cough has improved somewhat but persistent.  He finished the azithromycin  a few days ago.   Family states that since he has been taking these new medications, they have noted increased confusion, left sided weakness, increased falls,  slurred speech, yellowing of his skin, and swelling in his lower extremities.They also  note he recently finished radiation therapy from skin cancer.   At baseline uses 3 wheeled walker per wife. No falls at home per wife.  1/29: Vitals stable, slight worsening of leukocytosis which can be due to  thoracentesis yesterday.  Repeat thoracentesis today. Echocardiogram with significantly reduced EF of 25 to 30%, global hypokinesis and severe AS.  Patient is not a candidate for further intervention due to advanced age and significant underlying comorbidities.  Appropriate for comfort focused care, palliative care is on board.  Assessment and Plan:  AMS/acute metabolic encephalopathy Falls  acute CVA right frontal small ischemic non-hemorrhagic stroke - wife describes delirium, at the time of my encounter at baseline mental status.  slurred speech noted in the past few weeks with increasing falls and weakness, concerning for subacute CVA  CT head unremarkable.  - MRI brain 1. 9 mm acute ischemic nonhemorrhagic infarct involving the white matter of the posterior right frontal centrum semi ovale. 2. Underlying age-related cerebral atrophy with mild chronic small vessel ischemic disease, with a few small remote lacunar infarcts about the bilateral basal ganglia. 3. 9 mm T2 hyperintense lesion within the left parotid gland, indeterminate, but could reflect a primary salivary neoplasm. -Continue Eliquis  after Thora -Not on aspirin  due to increased risk of bleeding  - PT/OT/SLP - fall and delerium precautions    Acute hypoxic respiratory failure  sepsis due to severe right upper lobe pneumonia bilateral large pleural effusion CHF acute on chronic diastolic--BNP 29,000  - with increasing b/l pleural effusions and progression of RUL consolidation  -start patient on Zosyn  and continue doxycycline  -- stress doses steroids with Solu-Medrol-decrease the dose to daily -- speech therapy to see patient-recommending dysphagia 1 diet --1/27--Left side Thoracentesis to be done by IR  --1/29: Right-sided thoracentesis planned for today  Acute HFrEF. Severe aortic stenosis. Significantly elevated proBNP, echocardiogram done today with significantly reduced EF of 25%.  Severe aortic stenosis.   Cardiology is on board, not a candidate for further intervention at this time. - Palliative care concern  AKI  (baseline creat 1.1 in feb 2025) in the setting of sepsis - BUN 63, Cr 1.93. Baseline Cr around 1, with peripheral edema and pleural effusions despite recently starting lasix  20mg  daily (per wife)  -   hold nephrotoxic agents  -- monitor input output  Elevated troponin - no CP. Suspect demand  - trend trop, monitor on telemetry  - ECHO with diagnosis of acute HFrEF and severe AS   CAD s/p stenting  acute on chronic a fib with RVR  history of paroxysmal afib [facility] cardiology consulted-- will start po eliquis  after bilateral thoracentesis is done, till then start IV heparin  gtt -- Low dose BB   HTN  - resume home medications as appropriate  - PRN hydralazine     T2DM  - SSI    Tobacco abuse  - nicotine  patch   Critically ill.  Palliative care consultation   Spoke with pt's dter and wife  and they request DNR/DNI  Procedures: Family communication : Discussed with wife and 2 daughters at bedside Consults : neurology, cardiology, IR CODE STATUS: DNR DVT Prophylaxis : heparin  gtt-Will restart Eliquis  from tomorrow Level of care: Progressive Status is: Inpatient Remains inpatient appropriate because: sepsis due to pneumonia, a fib with RVR, CHF, AK I    TOTAL TIME TAKING CARE OF THIS PATIENT: 50 minutes.  >50% time spent on counselling and coordination of care  Note: This dictation was prepared with Dragon dictation along with  smaller phrase technology. Any transcriptional errors that result from this process are unintentional.  Amaryllis Dare M.D    [facility]   CC: Primary care physician; Jadali, Fayegh, MD

## 2024-07-19 NOTE — Progress Notes (Signed)
 "  Rounding Note   Patient Name: Gary Lynn Date of Encounter: 07/19/2024  [facility] Cardiologist: Deatrice Cage, MD   Subjective Resting comfortably in bed, sleeping but arousable - Family by the bedside including wife, 2 daughters - Wife concerned that he was agitated overnight, did not sleep well - Underwent thoracentesis yesterday, 700 mL out from the left  -scheduled for second thoracentesis today  Discussed echocardiogram findings that detail severely reduced ejection fraction 25%, global hypokinesis - Also with low-flow, low gradient severe aortic valve stenosis, heavily calcified valve  Telemetry reviewed atrial fibrillation/flutter overnight converting to normal sinus rhythm 2:31 AM back to atrial flutter 6:25 AM rate this morning 130 bpm  Scheduled Meds:   stroke: early stages of recovery book   Does not apply Once   atorvastatin   40 mg Oral Daily   Chlorhexidine  Gluconate Cloth  6 each Topical Daily   feeding supplement  237 mL Oral BID BM   finasteride   5 mg Oral Daily   guaiFENesin   600 mg Oral BID   hydrocortisone  sod succinate (SOLU-CORTEF ) inj  50 mg Intravenous Q6H   insulin  aspart  0-15 Units Subcutaneous TID WC   insulin  aspart  0-5 Units Subcutaneous QHS   metoprolol  tartrate  25 mg Oral BID   nicotine   7 mg Transdermal Daily   tamsulosin   0.4 mg Oral Daily   Continuous Infusions:  piperacillin -tazobactam (ZOSYN )  IV 3.375 g (07/19/24 1255)   PRN Meds: acetaminophen  **OR** acetaminophen , ipratropium-albuterol , metoprolol  tartrate, ondansetron  **OR** ondansetron  (ZOFRAN ) IV   Vital Signs  Vitals:   07/19/24 0300 07/19/24 0400 07/19/24 0500 07/19/24 0600  BP: 113/72 110/74 (!) 124/55 115/62  Pulse: 64 77 65 84  Resp: (!) 26 (!) 24 18 (!) 29  Temp:  97.9 F (36.6 C)    TempSrc:  Axillary    SpO2: 96% 96% 94% (!) 87%  Weight:      Height:        Intake/Output Summary (Last 24 hours) at 07/19/2024 1305 Last data filed at 07/19/2024  0600 Gross per 24 hour  Intake 689.62 ml  Output 450 ml  Net 239.62 ml      07/17/2024    9:00 AM 07/17/2024    5:00 AM 07/16/2024    1:54 PM  Last 3 Weights  Weight (lbs) 133 lb 13.1 oz 144 lb 6.4 oz 138 lb 14.2 oz  Weight (kg) 60.7 kg 65.5 kg 63 kg      Telemetry Atrial fibrillation converting to normal sinus rhythm 2:31 AM back to atrial flutter 6:25 AM, rate currently 130  Personally Reviewed  ECG   - Personally Reviewed  Physical Exam  GEN: No acute distress.   Neck: No JVD Cardiac: RRR, 2/6 SEM RSB Respiratory: Clear to auscultation bilaterally. GI: Soft, nontender, non-distended  MS: No edema; No deformity. Neuro:  Nonfocal  Psych: Normal affect   Labs High Sensitivity Troponin:  No results for input(s): TROPONINIHS in the last 720 hours.  Recent Labs  Lab 07/16/24 1339 07/16/24 1529  TRNPT 68* 56*       Chemistry Recent Labs  Lab 07/16/24 1339 07/16/24 1804 07/17/24 0537 07/18/24 0331  NA 140  --  142 144  K 4.4  --  4.3 4.3  CL 104  --  108 107  CO2 23  --  24 24  GLUCOSE 370*  --  198* 173*  BUN 66*  --  63* 63*  CREATININE 1.97*  --  1.82*  1.93*  CALCIUM  9.2  --  9.2 8.4*  MG  --   --  2.3 2.2  PROT  --  6.9 6.6  --   ALBUMIN  --  3.4* 3.2* 3.1*  AST  --  21 23  --   ALT  --  40 36  --   ALKPHOS  --  117 106  --   BILITOT  --  0.3 0.4  --   GFRNONAA 31*  --  35* 32*  ANIONGAP 14  --  10 13    Lipids  Recent Labs  Lab 07/17/24 0537  CHOL 89  TRIG 64  HDL 33*  LDLCALC 43  CHOLHDL 2.7    Hematology Recent Labs  Lab 07/17/24 0537 07/18/24 0331 07/19/24 0115  WBC 9.6 9.3 14.1*  RBC 4.08* 4.04* 3.87*  HGB 11.8* 11.9* 11.3*  HCT 39.4 38.7* 36.9*  MCV 96.6 95.8 95.3  MCH 28.9 29.5 29.2  MCHC 29.9* 30.7 30.6  RDW 16.0* 16.3* 16.4*  PLT 209 197 194   Thyroid   Recent Labs  Lab 07/17/24 1804  TSH 1.870    BNP Recent Labs  Lab 07/16/24 1339  PROBNP 29,741.0*    DDimer No results for input(s): DDIMER in the last  168 hours.   Radiology  ECHOCARDIOGRAM COMPLETE Result Date: 07/18/2024    ECHOCARDIOGRAM REPORT   Patient Name:   Gary Lynn [facility] Date of Exam: 07/18/2024 Medical Rec #:  [id number]      Height:       66.0 in Accession #:    [id number]     Weight:       133.8 lb Date of Birth:  08-16-1930     BSA:          1.686 m Patient Age:    89 years       BP:           109/50 mmHg Patient Gender: M              HR:           80 bpm. Exam Location:  [facility] Procedure: 2D Echo, Cardiac Doppler and Color Doppler (Both Spectral and Color            Flow Doppler were utilized during procedure). Indications:     Atrial Fibrillation I48.91  History:         Patient has prior history of Echocardiogram examinations, most                  recent 06/02/2016. Risk Factors:Diabetes and Hypertension.                  Paroxysmal Afib.  Sonographer:     Christopher Furnace Referring Phys:  [id number] DAVED BROCKS [facility] Diagnosing Phys: Evalene Lunger MD IMPRESSIONS  1. Left ventricular ejection fraction, by estimation, is 25 to 30%. The left ventricle has severely decreased function. The left ventricle demonstrates global hypokinesis with severe inferior wall hypokinesis. Left ventricular diastolic parameters are indeterminate.  2. Right ventricular systolic function is normal. The right ventricular size is normal.  3. Moderate pleural effusion in the left lateral region.  4. The mitral valve is normal in structure. Mild mitral valve regurgitation. No evidence of mitral stenosis.  5. The aortic valve is calcified. Aortic valve regurgitation is not visualized. Suspect low flow, low gradient, Severe aortic valve stenosis. Aortic valve area, by VTI measures 0.89 cm.  6. The inferior vena cava is normal in size  with greater than 50% respiratory variability, suggesting right atrial pressure of 3 mmHg. FINDINGS  Left Ventricle: Left ventricular ejection fraction, by estimation, is 25 to 30%. The left ventricle has severely decreased function. The left ventricle  demonstrates global hypokinesis. Strain was performed and the global longitudinal strain is indeterminate. The left ventricular internal cavity size was normal in size. There is no left ventricular hypertrophy. Left ventricular diastolic parameters are indeterminate. Right Ventricle: The right ventricular size is normal. No increase in right ventricular wall thickness. Right ventricular systolic function is normal. Left Atrium: Left atrial size was normal in size. Right Atrium: Right atrial size was normal in size. Pericardium: There is no evidence of pericardial effusion. Mitral Valve: The mitral valve is normal in structure. Mild mitral valve regurgitation. No evidence of mitral valve stenosis. Tricuspid Valve: The tricuspid valve is normal in structure. Tricuspid valve regurgitation is not demonstrated. No evidence of tricuspid stenosis. Aortic Valve: The aortic valve is calcified. Aortic valve regurgitation is not visualized. Severe aortic stenosis is present. Aortic valve mean gradient measures 9.5 mmHg. Aortic valve peak gradient measures 19.2 mmHg. Aortic valve area, by VTI measures 0.89 cm. Pulmonic Valve: The pulmonic valve was normal in structure. Pulmonic valve regurgitation is not visualized. No evidence of pulmonic stenosis. Aorta: The aortic root is normal in size and structure. Venous: The inferior vena cava is normal in size with greater than 50% respiratory variability, suggesting right atrial pressure of 3 mmHg. IAS/Shunts: No atrial level shunt detected by color flow Doppler. Additional Comments: 3D was performed not requiring image post processing on an independent workstation and was indeterminate. There is a moderate pleural effusion in the left lateral region.  LEFT VENTRICLE PLAX 2D LVIDd:         5.30 cm LVIDs:         5.00 cm LV PW:         0.90 cm LV IVS:        1.20 cm LVOT diam:     2.00 cm LV SV:         37 LV SV Index:   22 LVOT Area:     3.14 cm  RIGHT VENTRICLE RV Basal diam:  4.70  cm RV Mid diam:    3.50 cm LEFT ATRIUM           Index        RIGHT ATRIUM           Index LA diam:      3.00 cm 1.78 cm/m   RA Area:     17.40 cm LA Vol (A2C): 19.8 ml 11.74 ml/m  RA Volume:   49.20 ml  29.18 ml/m LA Vol (A4C): 22.3 ml 13.23 ml/m  AORTIC VALVE AV Area (Vmax):    0.71 cm AV Area (Vmean):   0.76 cm AV Area (VTI):     0.89 cm AV Vmax:           219.25 cm/s AV Vmean:          138.250 cm/s AV VTI:            0.414 m AV Peak Grad:      19.2 mmHg AV Mean Grad:      9.5 mmHg LVOT Vmax:         49.50 cm/s LVOT Vmean:        33.500 cm/s LVOT VTI:          0.117 m LVOT/AV VTI ratio: 0.28  AORTA Ao  Root diam: 3.40 cm MITRAL VALVE MV Area (PHT): 6.17 cm    SHUNTS MV Decel Time: 123 msec    Systemic VTI:  0.12 m MV E velocity: 86.50 cm/s  Systemic Diam: 2.00 cm Evalene Lunger MD Electronically signed by Evalene Lunger MD Signature Date/Time: 07/18/2024/4:04:45 PM    Final    US  THORACENTESIS LEFT ASP PLEURAL SPACE W/IMG GUIDE Result Date: 07/18/2024 INDICATION: 89 year old male currently admitted with acute hypoxic respiratory failure and sepsis secondary to severe right upper lobe pneumonia. Imaging demonstrated bilateral pleural effusions. Request for diagnostic and therapeutic left-sided thoracentesis. EXAM: ULTRASOUND GUIDED DIAGNOSTIC AND THERAPEUTIC, LEFT-SIDED THORACENTESIS MEDICATIONS: 1% lidocaine , 5 mL. COMPLICATIONS: None immediate. PROCEDURE: An ultrasound guided thoracentesis was thoroughly discussed with the patient and questions answered. The benefits, risks, alternatives and complications were also discussed. The patient understands and wishes to proceed with the procedure. Written consent was obtained. Ultrasound was performed to localize and mark an adequate pocket of fluid in the left chest. The area was then prepped and draped in the normal sterile fashion. 1% Lidocaine  was used for local anesthesia. Under ultrasound guidance a 6 Fr Safe-T-Centesis catheter was introduced.  Thoracentesis was performed. The catheter was removed and a dressing applied. FINDINGS: A total of approximately 700 mL of clear yellow fluid was removed. Samples were sent to the laboratory as requested by the clinical team. IMPRESSION: Successful ultrasound guided left-sided thoracentesis yielding 700 mL of pleural fluid. Procedure performed by: Sherrilee Bal, PA-C under the supervision Dr. DELENA Balder Electronically Signed   By: Juliene Balder M.D.   On: 07/18/2024 15:33   DG Chest Port 1 View Result Date: 07/18/2024 EXAM: 1 VIEW(S) XRAY OF THE CHEST 07/18/2024 11:46:00 AM COMPARISON: 07/16/2024 CLINICAL HISTORY: Shortness of breath. Status post thoracentesis. FINDINGS: LUNGS AND PLEURA: Low lung volumes. Masslike opacity in right upper lobe. Similar small right pleural effusion. Hazy opacities in right lung. Background chronic interstitial lung disease. Hazy opacities in left lung. No pneumothorax. HEART AND MEDIASTINUM: Aortic atherosclerosis. No acute abnormality of the cardiac and mediastinal silhouettes. BONES AND SOFT TISSUES: No acute osseous abnormality. IMPRESSION: 1. Hazy opacities in right and left lungs, which may reflect interstitial edema versus atypical infection. . 2. Unchanged mass-like opacity in right upper lobe. 3. Similar small right pleural effusion. 4. Decreased volume of left pleural effusion with improved aeration to the left lung base. Electronically signed by: Waddell Calk MD 07/18/2024 12:27 PM EST RP Workstation: [id number]    Cardiac Studies   Patient Profile   Mr. Bialy is a 89 year old male with history of coronary artery disease status post PCI to the RCA, paroxysmal atrial fibrillation/flutter, chronic HFpEF, hypertension, hyperlipidemia, type 2 diabetes mellitus, and carotid artery disease, who we have been asked to see due to atrial fibrillation with rapid ventricular response in the setting of acute stroke and pneumonia   Plan: Atrial fibrillation with RVR -Originally  diagnosed with paroxysmal atrial fibrillation in 2017 with intermittent A-fib and a flutter -As outpatient was taking Xarelto  15 mg daily CHA2DS2-VASc score of at least 6 for stroke prophylaxis -in clinic 07/13/2024 noted  in atypical atrial flutter with RVR, started on metoprolol  25 mg twice daily  -This admission again with paroxysmal atrial fibrillation/flutter -Anticoagulation initially held in the setting of acute stroke -Cleared by neurology to restart anticoagulation yesterday -Heparin  infusion held this morning for thoracentesis on right -Had episode of sinus rhythm from 2:31 AM until 6:25 AM now back to atrial flutter rate 130 -Will increase metoprolol  to tartrate  up to 25 twice daily -Amiodarone not ideal as he is not on anticoagulation and recent stroke though he continues to have paroxysmal arrhythmia and if he has difficult to control rate and rhythm control we may need to start amiodarone  2.  Cardiomyopathy Severely depressed ejection fraction 25% Unable to exclude tachycardia mediated cardiomyopathy versus underlying ischemia -Given recent stroke, debility, not a good candidate for cardiac catheterization  3. Severe aortic valve stenosis Severely depressed ejection fraction, low-flow low gradient severely calcified valve -Not a good candidate for TAVR given debility, recent stroke -Palliative following   4. CVA right frontal small ischemic nonhemorrhagic stroke/altered mental status -In the setting of paroxysmal atrial fibrillation - Episodes of delirium and screaming,  Slurred speech noted in the past few weeks with increasing falls and weakness - CT of the head was unremarkable -MRI of the brain revealed 1.9 mm acute ischemic nonhemorrhagic infarct involving the white matter of the posterior right frontal centrum semi ovale -Speech and swallow has evaluated - Neuro allergy following - Restart heparin  following thoracentesis today   5. Acute hypoxic respiratory  failure/severe right upper lobe pneumonia and bilateral large pleural effusions/acute on chronic HFpEF -BNP of greater than 29,000 -Increasing bilateral pleural effusions with progression of right upper lobe consolidation -Continued on Zosyn  and doxycycline  on steroids -Thoracentesis yesterday 700 mL out, scheduled for thoracentesis on right -Severely depressed ejection fraction estimated 25% likely contributing exacerbated by rapid atrial fibs/flutter -Lasix  on hold in the setting of renal failure creatinine 1.9 GFR 32   Elevated high-sensitivity troponin History of coronary artery disease prior PCI -High-sensitivity troponins trended 68 and 56 -Likely demand ischemia from atrial fibrillation and pneumonia -Not consistent with ACS -Of concern of severely reduced ejection fraction though currently not a candidate for cardiac catheterization  AKI -Serum creatinine 1.9 -Baseline serum creatinine 1.2 -Suspect component of ATN   CAD status post stenting -Without angina -Not on antiplatelet medication given anticoagulation with NOAC for A-fib - Continue atorvastatin  once taking pills   Hyperlipidemia -LDL 43 on atorvastatin  40 mg daily   Hypotension -Longstanding history of orthostatic hypotension, may need to initiate midodrine if symptomatic  Type 2 diabetes -Continued on insulin  -Ongoing management per IM   For questions or updates, please contact [facility] Please consult [email or web address] for contact info under     Signed, Denette Hass, MD  07/19/2024, 1:05 PM    "

## 2024-07-19 NOTE — Consult Note (Signed)
 PHARMACY - ANTICOAGULATION CONSULT NOTE  Pharmacy Consult for IV Heparin  Indication: atrial fibrillation  Allergies[1]  Patient Measurements: Height: 5' 6 (167.6 cm) Weight: 60.7 kg (133 lb 13.1 oz) IBW/kg (Calculated) : 63.8 HEPARIN  DW (KG): 60.7  Labs: Recent Labs    07/16/24 1339 07/17/24 0537 07/18/24 0331 07/18/24 1459 07/19/24 0115  HGB 12.3* 11.8* 11.9*  --  11.3*  HCT 40.5 39.4 38.7*  --  36.9*  PLT 209 209 197  --  194  APTT  --   --   --  27 56*  LABPROT 19.9* 28.0*  --   --   --   INR 1.6* 2.5*  --   --   --   HEPARINUNFRC  --   --   --   --  0.41  CREATININE 1.97* 1.82* 1.93*  --   --     Estimated Creatinine Clearance: 21.4 mL/min (A) (by C-G formula based on SCr of 1.93 mg/dL (H)).   Medical History: Past Medical History:  Diagnosis Date   Carotid artery stenosis    a. carotid doppler 05/2016: RICA 40-59%, LICA 1-39%   Chronic diastolic CHF (congestive heart failure) (HCC)    a. echo 05/2016: EF 55-60%, AK of the basal-midinferolateral and inferior myocardium (c/w prior study), GR1DD, aortic sclerosis without stenosis, mild MR, PASP 40 mmHg   Coronary artery disease    Cardiac cath in February of 2013: EF 60%, high-grade chronic disease in OM 1, occluded mid RCA at the site of a previously placed stent with left-to-right collaterals, occluded left SFA.   Diabetes mellitus with complication (HCC)    Hypercholesterolemia    Hypertension    PAF (paroxysmal atrial fibrillation) (HCC)    a. Zio monitor 05/2016: predominant rhyhtm of sinus with intermittent episodes of Afib/flutter with the longest episode being 6 hours and 51 minutes with an average heart rate of 114 bpm. Occasional PACs and PVCs. b. CHADS2VASc --> 6 (CHF, HTN, age x 2, DM, vascular disease)    Medications:  Xarelto  15 mg prior to admission. Last reported dose was 07/15/24  Assessment: 89 y/o M with medical history including Afib admitted with respiratory failure secondary to pneumonia  and bilateral pleural effusions and stroke. Pharmacy consulted to dose heparin  for Afib.  Baseline INR elevated at 2.5 likely secondary to Xarelto . Baseline aPTT pending. Anticipate elevated baseline anti-Xa level.  Plan is to put patient on Eliquis  after completion of procedures. Pending right-sided thoracentesis 07/19/24.   Goal of Therapy:  Heparin  level 0.3 - 0.5 units/ml aPTT 66 - 85 seconds Monitor platelets by anticoagulation protocol: Yes   Plan:  1/29 @ 0115:  aPTT = 56,  HL = 0.41 --aPTT SUBtherapeutic, not correlating with HL  --will increase heparin  drip rate to 900 units/hr --Heparin  gtt set to d/c @ 0800 in preparation for upcoming surgery --recheck aPTT and HL 8 hrs after rate change in case surgery is cancelled, anticipate correlation with next check  --Will follow no bolus / stroke protocol given recent CVA --Daily CBC per protocol while on IV heparin   Naelani Lafrance D 07/19/2024,1:50 AM     [1] No Known Allergies

## 2024-07-19 NOTE — Progress Notes (Signed)
 OT Cancellation Note  Patient Details Name: Gary Lynn MRN: [id number] DOB: Apr 29, 1931   Cancelled Treatment:    Reason Eval/Treat Not Completed: Patient at procedure or test/ unavailable. Pt is currently off unit for thoracentesis. OT will re-attempt evaluation when pt is next available.   Izetta Claude, MS, OTR/L , CBIS ascom [phone]  07/19/24, 1:46 PM

## 2024-07-19 NOTE — Inpatient Diabetes Management (Signed)
 Inpatient Diabetes Program Recommendations  AACE/ADA: New Consensus Statement on Inpatient Glycemic Control (2015)  Target Ranges:  Prepandial:   less than 140 mg/dL      Peak postprandial:   less than 180 mg/dL (1-2 hours)      Critically ill patients:  140 - 180 mg/dL    Latest Reference Range & Units 07/18/24 07:52 07/18/24 12:06 07/18/24 16:32 07/18/24 21:13  Glucose-Capillary 70 - 99 mg/dL 837 (H)  3 units Novolog   193 (H)  3 units Novolog   399 (H)  15 units Novolog   335 (H)  4 units Novolog    (H): Data is abnormally high  Latest Reference Range & Units 07/19/24 07:32  Glucose-Capillary 70 - 99 mg/dL 768 (H)  (H): Data is abnormally high     Home DM Meds: Glipizide  5 mg QPM        Rybelsus  7 mg daily  Current Orders: Novolog  Moderate Correction Scale/ SSI (0-15 units) TID AC + HS    MD- Note pt getting Solucortef 50 mg Q6H. Afternoon CBGs significantly elevated yest afternoon. CBG 231 this AM  May consider while getting steroids:  1. Start Lantus 6 units daily (0.1 units/kg)  2. Start Novolog  Meal Coverage: Novolog  3 units TID with meals HOLD if pt NPO HOLD if pt eats <50% meals    --Will follow patient during hospitalization--  Adina Rudolpho Arrow RN, MSN, CDCES Diabetes Coordinator Inpatient Glycemic Control Team Team Pager: [phone] (8a-5p)

## 2024-07-20 DIAGNOSIS — I5020 Unspecified systolic (congestive) heart failure: Secondary | ICD-10-CM

## 2024-07-20 DIAGNOSIS — I4891 Unspecified atrial fibrillation: Secondary | ICD-10-CM | POA: Diagnosis not present

## 2024-07-20 DIAGNOSIS — I480 Paroxysmal atrial fibrillation: Secondary | ICD-10-CM | POA: Diagnosis not present

## 2024-07-20 DIAGNOSIS — I420 Dilated cardiomyopathy: Secondary | ICD-10-CM | POA: Diagnosis not present

## 2024-07-20 DIAGNOSIS — N179 Acute kidney failure, unspecified: Secondary | ICD-10-CM | POA: Diagnosis not present

## 2024-07-20 DIAGNOSIS — I350 Nonrheumatic aortic (valve) stenosis: Secondary | ICD-10-CM | POA: Diagnosis not present

## 2024-07-20 LAB — CBC
HCT: 35.6 % — ABNORMAL LOW (ref 39.0–52.0)
Hemoglobin: 10.8 g/dL — ABNORMAL LOW (ref 13.0–17.0)
MCH: 29.1 pg (ref 26.0–34.0)
MCHC: 30.3 g/dL (ref 30.0–36.0)
MCV: 96 fL (ref 80.0–100.0)
Platelets: 178 10*3/uL (ref 150–400)
RBC: 3.71 MIL/uL — ABNORMAL LOW (ref 4.22–5.81)
RDW: 16.5 % — ABNORMAL HIGH (ref 11.5–15.5)
WBC: 16.5 10*3/uL — ABNORMAL HIGH (ref 4.0–10.5)
nRBC: 0.1 % (ref 0.0–0.2)

## 2024-07-20 LAB — PATHOLOGIST SMEAR REVIEW

## 2024-07-20 LAB — BASIC METABOLIC PANEL WITH GFR
Anion gap: 10 (ref 5–15)
BUN: 112 mg/dL — ABNORMAL HIGH (ref 8–23)
CO2: 27 mmol/L (ref 22–32)
Calcium: 8.1 mg/dL — ABNORMAL LOW (ref 8.9–10.3)
Chloride: 106 mmol/L (ref 98–111)
Creatinine, Ser: 2.64 mg/dL — ABNORMAL HIGH (ref 0.61–1.24)
GFR, Estimated: 22 mL/min — ABNORMAL LOW
Glucose, Bld: 262 mg/dL — ABNORMAL HIGH (ref 70–99)
Potassium: 3.7 mmol/L (ref 3.5–5.1)
Sodium: 143 mmol/L (ref 135–145)

## 2024-07-20 LAB — GLUCOSE, CAPILLARY
Glucose-Capillary: 231 mg/dL — ABNORMAL HIGH (ref 70–99)
Glucose-Capillary: 250 mg/dL — ABNORMAL HIGH (ref 70–99)
Glucose-Capillary: 232 mg/dL — ABNORMAL HIGH (ref 70–99)
Glucose-Capillary: 195 mg/dL — ABNORMAL HIGH (ref 70–99)

## 2024-07-20 MED ORDER — LOPERAMIDE HCL 2 MG PO CAPS
2.0000 mg | ORAL_CAPSULE | Freq: Once | ORAL | Status: AC
  Administered 2024-07-20: 2 mg via ORAL
  Filled 2024-07-20: qty 1

## 2024-07-20 MED ORDER — HYDROCORTISONE SOD SUC (PF) 100 MG IJ SOLR
30.0000 mg | Freq: Every day | INTRAMUSCULAR | Status: DC
  Administered 2024-07-21 – 2024-07-22 (×2): 30 mg via INTRAVENOUS
  Filled 2024-07-20 (×2): qty 0.6

## 2024-07-20 NOTE — Inpatient Diabetes Management (Signed)
 Inpatient Diabetes Program Recommendations  AACE/ADA: New Consensus Statement on Inpatient Glycemic Control (2015)  Target Ranges:  Prepandial:   less than 140 mg/dL      Peak postprandial:   less than 180 mg/dL (1-2 hours)      Critically ill patients:  140 - 180 mg/dL    Latest Reference Range & Units 07/19/24 07:32 07/19/24 11:54 07/19/24 15:15 07/19/24 21:42  Glucose-Capillary 70 - 99 mg/dL 768 (H) 629 (H) 649 (H) 138 (H)  (H): Data is abnormally high  Latest Reference Range & Units 07/20/24 08:58 07/20/24 12:31  Glucose-Capillary 70 - 99 mg/dL 768 (H) 749 (H)  (H): Data is abnormally high   Home DM Meds: Glipizide  5 mg QPM                              Rybelsus  7 mg daily   Current Orders: Novolog  Moderate Correction Scale/ SSI (0-15 units) TID AC + HS       MD- Note pt getting Solucortef 50 mg Daily    May consider while getting steroids:   1. Start Lantus 6 units daily (0.1 units/kg)   2. Start Novolog  Meal Coverage: Novolog  3 units TID with meals HOLD if pt NPO HOLD if pt eats <50% meals       --Will follow patient during hospitalization--   Adina Rudolpho Arrow RN, MSN, CDCES Diabetes Coordinator Inpatient Glycemic Control Team Team Pager: [phone] (8a-5p)

## 2024-07-20 NOTE — Evaluation (Signed)
 Physical Therapy Evaluation Patient Details Name: Gary Lynn MRN: [id number] DOB: [birth year] Today's Date: 07/20/2024  History of Present Illness  Pt is a 89 y.o. male with medical history significant of CAD s/p stenting, paroxysmal afib on xarelto , skin cancer status post radiation, T2DM presents to the emergency department for evaluation of altered mental status with family reporting noted increased confusion, left sided weakness, increased falls, slurred speech, yellowing of his skin, and swelling in his lower extremities. MD assessment includes: AMS/acute metabolic encephalopathy, falls, acute right frontal small ischemic non-hemorrhagic stroke, acute hypoxic respiratory failure, sepsis due to severe right upper lobe pneumonia, bilateral large pleural effusion, acute on chronic CHF, AKI, and elevated troponin with suspected demand ischemia.   Clinical Impression  Pt was pleasant and motivated to participate during the session and put forth good effort throughout. Pt required min A with all functional tasks along with cuing for sequencing.  Pt with noted weakness in his LLE compared to his RLE per below including decreased L DF strength resulting in foot drop during limited ambulation with AFO recommended at this time. Pt found on 2LO2/min with SpO2 mostly in the low 90s during the session dropping to a low of 89% with ambulation but quickly increased back to the low 90s upon returning to sitting.  Pt is at a very high risk for falls and will benefit from continued PT services upon discharge to safely address deficits listed in patient problem list for decreased caregiver assistance and eventual return to PLOF.          If plan is discharge home, recommend the following: Two people to help with walking and/or transfers;A lot of help with bathing/dressing/bathroom;Assistance with cooking/housework;Direct supervision/assist for medications management;Assist for transportation;Help with stairs or  ramp for entrance   Can travel by private vehicle   No    Equipment Recommendations Wheelchair cushion (measurements PT);Other (comment) (AFO for L foot)  Recommendations for Other Services       Functional Status Assessment Patient has had a recent decline in their functional status and demonstrates the ability to make significant improvements in function in a reasonable and predictable amount of time.     Precautions / Restrictions Precautions Precautions: Fall Restrictions Weight Bearing Restrictions Per Provider Order: No      Mobility  Bed Mobility Overal bed mobility: Needs Assistance Bed Mobility: Supine to Sit     Supine to sit: Min assist     General bed mobility comments: Min A for BLE and trunk control with cuing for sequencing    Transfers Overall transfer level: Needs assistance Equipment used: Rolling walker (2 wheels) Transfers: Sit to/from Stand Sit to Stand: Min assist           General transfer comment: Fair concentric but poor eccentric control with cuing for hand placement    Ambulation/Gait Ambulation/Gait assistance: Min assist Gait Distance (Feet): 3 Feet Assistive device: Rolling walker (2 wheels) Gait Pattern/deviations: Step-to pattern, Decreased stance time - left, Decreased step length - right Gait velocity: decreased     General Gait Details: Foot drop on the LLE with difficulty advancing the LLE and maintaining stability during LLE stance phase.  Pt able to take several small steps near the EOB and to the chair with min A to guide the RW and for stability  Stairs            Wheelchair Mobility     Tilt Bed    Modified Rankin (Stroke Patients Only)  Balance Overall balance assessment: Needs assistance   Sitting balance-Leahy Scale: Fair     Standing balance support: Bilateral upper extremity supported, During functional activity, Reliant on assistive device for balance Standing balance-Leahy Scale:  Poor                               Pertinent Vitals/Pain Pain Assessment Pain Assessment: No/denies pain    Home Living Family/patient expects to be discharged to:: Private residence Living Arrangements: Spouse/significant other Available Help at Discharge: Family;Available 24 hours/day Type of Home: House Home Access: Stairs to enter Entrance Stairs-Rails: Right;Left (too wide for both) Entrance Stairs-Number of Steps: 3   Home Layout: One level Home Equipment: Shower seat - built in;Rollator (4 wheels);Cane - single Information Systems Manager (2 wheels) Additional Comments: 3WW; history from patient and family in the room    Prior Function Prior Level of Function : Needs assist;History of Falls (last six months)             Mobility Comments: Mod Ind amb with a 3WW in the home and SPC in the community limited community distances, 2 falls secondary to LOB ADLs Comments: Spouse assists with ADLs as needed, pt mostly sponge bathes     Extremity/Trunk Assessment   Upper Extremity Assessment Upper Extremity Assessment: Defer to OT evaluation    Lower Extremity Assessment Lower Extremity Assessment: Generalized weakness;LLE deficits/detail LLE Deficits / Details: LLE knee flex and ext both 3+ to 4-/5, DF 2/5 RLE strength grossly [facility]       Communication   Communication Communication: Impaired Factors Affecting Communication: Difficulty expressing self;Hearing impaired    Cognition Arousal: Alert Behavior During Therapy: WFL for tasks assessed/performed                             Following commands: Impaired Following commands impaired: Follows one step commands with increased time     Cueing Cueing Techniques: Verbal cues, Tactile cues     General Comments      Exercises     Assessment/Plan    PT Assessment Patient needs continued PT services  PT Problem List Decreased strength;Decreased activity tolerance;Decreased  balance;Decreased mobility;Decreased knowledge of use of DME       PT Treatment Interventions DME instruction;Stair training;Functional mobility training;Therapeutic activities;Therapeutic exercise;Balance training;Gait training;Patient/family education    PT Goals (Current goals can be found in the Care Plan section)  Acute Rehab PT Goals Patient Stated Goal: To get stronger PT Goal Formulation: With patient Time For Goal Achievement: 08/02/24 Potential to Achieve Goals: Good    Frequency Min 2X/week     Co-evaluation               AM-PAC PT 6 Clicks Mobility  Outcome Measure Help needed turning from your back to your side while in a flat bed without using bedrails?: A Little Help needed moving from lying on your back to sitting on the side of a flat bed without using bedrails?: A Little Help needed moving to and from a bed to a chair (including a wheelchair)?: A Little Help needed standing up from a chair using your arms (e.g., wheelchair or bedside chair)?: A Little Help needed to walk in hospital room?: Total Help needed climbing 3-5 steps with a railing? : Total 6 Click Score: 14    End of Session Equipment Utilized During Treatment: Gait belt;Oxygen Activity Tolerance: Patient tolerated treatment well  Patient left: in chair;with call bell/phone within reach;with chair alarm set;with family/visitor present Nurse Communication: Mobility status PT Visit Diagnosis: Unsteadiness on feet (R26.81);History of falling (Z91.81);Difficulty in walking, not elsewhere classified (R26.2);Muscle weakness (generalized) (M62.81)    Time: 9083-9043 PT Time Calculation (min) (ACUTE ONLY): 40 min   Charges:   PT Evaluation $PT Eval Moderate Complexity: 1 Mod PT Treatments $Therapeutic Activity: 8-22 mins PT General Charges $$ ACUTE PT VISIT: 1 Visit    D. Scott Cleofas Hudgins PT, DPT 07/20/24, 11:28 AM

## 2024-07-20 NOTE — Progress Notes (Signed)
 [facility]  - [place] at [facility]   PATIENT NAME: Gary Lynn    MR#:  [id number]  DATE OF BIRTH:  [birth year]  SUBJECTIVE:  Patient was resting after working with PT which wakes him really tired when seen today.  Daughter and son at bedside.  Wife still not made any decisions regarding home with hospice but definitely wants him at home and declining SNF.  Family would like to get some help at home along with some equipments.  VITALS:  Blood pressure (!) 101/47, pulse 89, temperature (!) 97.4 F (36.3 C), temperature source Axillary, resp. rate 19, height 5' 6 (1.676 m), weight 60.7 kg, SpO2 99%.  PHYSICAL EXAMINATION:   General.  Very frail and malnourished elderly man, in no acute distress. Pulmonary.  Lungs clear bilaterally, normal respiratory effort. CV.  Regular rate and rhythm, no JVD, rub or murmur. Abdomen.  Soft, nontender, nondistended, BS positive. CNS.  Somnolent but arousable Extremities.  No edema, pulses intact and symmetrical.   LABORATORY PANEL:  CBC Recent Labs  Lab 07/20/24 1212  WBC 16.5*  HGB 10.8*  HCT 35.6*  PLT 178    Chemistries  Recent Labs  Lab 07/17/24 0537 07/18/24 0331 07/20/24 1212  NA 142 144 143  K 4.3 4.3 3.7  CL 108 107 106  CO2 24 24 27   GLUCOSE 198* 173* 262*  BUN 63* 63* 112*  CREATININE 1.82* 1.93* 2.64*  CALCIUM  9.2 8.4* 8.1*  MG 2.3 2.2  --   AST 23  --   --   ALT 36  --   --   ALKPHOS 106  --   --   BILITOT 0.4  --   --    Cardiac Enzymes No results for input(s): TROPONINI in the last 168 hours. RADIOLOGY:  US  THORACENTESIS RIGHT ASP PLEURAL SPACE W/IMG GUIDE Result Date: 07/19/2024 INDICATION: 89 year old male currently admitted with acute hypoxic respiratory failure and sepsis secondary to severe right upper lobe pneumonia. Imaging demonstrated bilateral pleural effusions. Patient previous underwent left-sided thoracentesis on 07/19/2024. Request for diagnostic and therapeutic right-sided  thoracentesis. EXAM: ULTRASOUND GUIDED DIAGNOSTIC AND THERAPEUTIC, RIGHT-SIDED THORACENTESIS MEDICATIONS: 1% lidocaine , 6 mL. COMPLICATIONS: None immediate. PROCEDURE: An ultrasound guided thoracentesis was thoroughly discussed with the patient and questions answered. The benefits, risks, alternatives and complications were also discussed. The patient understands and wishes to proceed with the procedure. Written consent was obtained. Ultrasound was performed to localize and mark an adequate pocket of fluid in the right chest. The area was then prepped and draped in the normal sterile fashion. 1% Lidocaine  was used for local anesthesia. Under ultrasound guidance a 6 Fr Safe-T-Centesis catheter was introduced. Thoracentesis was performed. The catheter was removed and a dressing applied. FINDINGS: A total of approximately 700 mL of amber fluid was removed. Samples were sent to the laboratory as requested by the clinical team. IMPRESSION: Successful ultrasound guided right-sided thoracentesis yielding 700 mL of pleural fluid. Procedure performed by: Sherrilee Bal, PA-C under the supervision of Dr. DELENA Balder Electronically Signed   By: Juliene Balder M.D.   On: 07/19/2024 15:51   DG Chest 1 View Result Date: 07/19/2024 CLINICAL DATA:  Recurrent right-sided pleural effusion. Status post thoracentesis. EXAM: CHEST  1 VIEW COMPARISON:  Yesterday FINDINGS: Numerous leads and wires project over the chest. Patient rotated right. Moderate cardiomegaly. Resolved right pleural effusion. No pneumothorax. Subpleural reticulation, most consistent with interstitial lung disease. Significantly improved aeration, with residual right upper lobe consolidation remaining. Prominent gas-filled left  upper abdominal bowel loops and/or stomach. IMPRESSION: Resolution of right pleural fluid, without pneumothorax after thoracentesis. Significantly improved aeration, with right upper lobe consolidation, likely pneumonia, remaining. Underlying  interstitial lung disease. Prominent gas within either stomach and/or bowel in the left upper quadrant. Correlate with abdominal symptoms and consider dedicated radiographs if indicated. Electronically Signed   By: Rockey Kilts M.D.   On: 07/19/2024 15:24   Brief Hospital course.  From H and p  Gary Lynn is a 89 y.o. male with medical history significant of CAD s/p stenting, paroxysmal afib on xarelto , skin cancer status post radiation, T2DM presents to the emergency department for evaluation of altered mental status.  He is accompanied by his wife and another family member who provided majority of the history due to patient being hard of hearing.  A few weeks ago, he saw his primary care provider for a cough and was prescribed azithromycin , Lasix , and Tessalon  Perles. Cough has improved somewhat but persistent.  He finished the azithromycin  a few days ago.   Family states that since he has been taking these new medications, they have noted increased confusion, left sided weakness, increased falls,  slurred speech, yellowing of his skin, and swelling in his lower extremities.They also note he recently finished radiation therapy from skin cancer.   At baseline uses 3 wheeled walker per wife. No falls at home per wife.  1/29: Vitals stable, slight worsening of leukocytosis which can be due to thoracentesis yesterday.  Repeat thoracentesis today. Echocardiogram with significantly reduced EF of 25 to 30%, global hypokinesis and severe AS.  Patient is not a candidate for further intervention due to advanced age and significant underlying comorbidities.  Appropriate for comfort focused care, palliative care is on board.  1/30: Vital stable on 3 L of oxygen.  Maximum home health and home equipments ordered as wife wants him to be at home, likely with outpatient palliative and switch to hospice later on.  Patient is high risk for mortality with multiorgan dysfunction.  Assessment and  Plan:  AMS/acute metabolic encephalopathy Falls  acute CVA right frontal small ischemic non-hemorrhagic stroke - wife describes delirium, at the time of my encounter at baseline mental status.  slurred speech noted in the past few weeks with increasing falls and weakness, concerning for subacute CVA  CT head unremarkable.  - MRI brain 1. 9 mm acute ischemic nonhemorrhagic infarct involving the white matter of the posterior right frontal centrum semi ovale. 2. Underlying age-related cerebral atrophy with mild chronic small vessel ischemic disease, with a few small remote lacunar infarcts about the bilateral basal ganglia. 3. 9 mm T2 hyperintense lesion within the left parotid gland, indeterminate, but could reflect a primary salivary neoplasm. -Continue Eliquis  after Thora -Not on aspirin  due to increased risk of bleeding  - PT/OT/SLP-recommending SNF but family wants home with home health - fall and delerium precautions    Acute hypoxic respiratory failure  sepsis due to severe right upper lobe pneumonia bilateral large pleural effusion CHF acute on chronic diastolic--BNP 29,000  - with increasing b/l pleural effusions and progression of RUL consolidation  -start patient on Zosyn  and continue doxycycline  -- stress doses steroids with Solu-Medrol-decrease the dose to daily, dose was further decreased. -- speech therapy to see patient-recommending dysphagia 1 diet --1/27--Left side Thoracentesis to be done by IR  --1/29: Right-sided thoracentesis with removal of 700 mL Cultures with no organisms seen.  Acute HFrEF. Severe aortic stenosis. Significantly elevated proBNP, echocardiogram with significantly reduced EF  of 25%.  Severe aortic stenosis.  Cardiology is on board, not a candidate for further intervention at this time. - Palliative care is on board-wife is not ready to switch him to hospice as advised by cardiology.  AKI  (baseline creat 1.1 in feb 2025) in the setting of  sepsis - Worsening renal function with BUN 112 and creatinine 2.64. Baseline Cr around 1, with peripheral edema and pleural effusions despite recently starting lasix  20mg  daily (per wife)  -   hold nephrotoxic agents  -- monitor input output  Elevated troponin - no CP. Suspect demand  - trend trop, monitor on telemetry  - ECHO with diagnosis of acute HFrEF and severe AS   CAD s/p stenting  acute on chronic a fib with RVR  history of paroxysmal afib [facility] cardiology consulted-- will start po eliquis  after bilateral thoracentesis is done, till then start IV heparin  gtt -- Low dose BB   HTN  - resume home medications as appropriate  - PRN hydralazine     T2DM  - SSI    Tobacco abuse  - nicotine  patch   Critically ill.  Palliative care consultation   Spoke with pt's dter and wife  and they request DNR/DNI  Procedures: Family communication : Discussed with daughter and son at bedside Consults : neurology, cardiology, IR CODE STATUS: DNR DVT Prophylaxis : Eliquis  Level of care: Progressive Status is: Inpatient Remains inpatient appropriate because: sepsis due to pneumonia, a fib with RVR, CHF, AK I    TOTAL TIME TAKING CARE OF THIS PATIENT: 50 minutes.  >50% time spent on counselling and coordination of care  Note: This dictation was prepared with Dragon dictation along with smaller phrase technology. Any transcriptional errors that result from this process are unintentional.  Amaryllis Dare M.D    [facility]   CC: Primary care physician; Jadali, Fayegh, MD

## 2024-07-20 NOTE — TOC CM/SW Note (Signed)
 Patient is not able to walk the distance required to go the bathroom, or he/she is unable to safely negotiate stairs required to access the bathroom.  A 3in1 BSC will alleviate this problem

## 2024-07-20 NOTE — TOC CM/SW Note (Signed)
" °  °  Durable Medical Equipment  (From admission, onward)           Start     Ordered   07/20/24 1517  For home use only DME Bedside commode  Once       Question:  Patient needs a bedside commode to treat with the following condition  Answer:  Weakness generalized   07/20/24 1516   07/20/24 1516  For home use only DME Hospital bed  Once       Question Answer Comment  Length of Need Lifetime   The above medical condition requires: Patient requires the ability to reposition frequently   Head must be elevated greater than: 30 degrees   Bed type Semi-electric   Hoyer Lift Yes   Trapeze Bar Yes   Support Surface: Gel Overlay      07/20/24 1516   07/20/24 1516  For home use only DME lightweight manual wheelchair with seat cushion  Once       Comments: Patient suffers from generalized weakness with multiple medical morbidities which impairs their ability to perform daily activities like bathing, dressing, feeding, grooming, and toileting in the home.  A walker will not resolve  issue with performing activities of daily living. A wheelchair will allow patient to safely perform daily activities. Patient is not able to propel themselves in the home using a standard weight wheelchair due to general weakness. Patient can self propel in the lightweight wheelchair. Length of need Lifetime. Accessories: elevating leg rests (ELRs), wheel locks, extensions and anti-tippers.   07/20/24 1516            "

## 2024-07-20 NOTE — TOC Initial Note (Signed)
 Transition of Care [facility]) - Initial/Assessment Note    Patient Details  Name: Gary Lynn MRN: [id number] Date of Birth: [birth year]  [facility]) CM/SW Contact:    Lauraine JAYSON Carpen, LCSW Phone Number: 07/20/2024, 4:33 PM  Clinical Narrative:  Patient not fully oriented. Wife and daughters at bedside. CSW introduced role and explained that therapy recommendations would be discussed. Family is agreeable to home health. Gave CMS scores for agencies that serve his zip code. Will send out referral to determine his options. Family wants hospital bed, BSC, and wheelchair. CSW ordered through [employer]. May also need home oxygen at discharge. Family agreeable to outpatient palliative and prefers [facility]. Liaison is aware. Patient will need ambulance transport home. CSW started insurance auth through [employer]. Per MD, likely discharge Monday. No further concerns. CSW will continue to follow patient and his family for support and facilitate return home once stable.                Expected Discharge Plan: Home w Home Health Services Barriers to Discharge: Continued Medical Work up   Patient Goals and CMS Choice   CMS Medicare.gov Compare Post Acute Care list provided to:: Patient Represenative (must comment) (Wife)        Expected Discharge Plan and Services     Post Acute Care Choice: Durable Medical Equipment, Home Health Living arrangements for the past 2 months: Single Family Home                 DME Arranged: Hospital bed, 3-N-1, Lightweight manual wheelchair with seat cushion DME Agency: [employer] Date DME Agency Contacted: 07/20/24   Representative spoke with at DME Agency: Mitch            Prior Living Arrangements/Services Living arrangements for the past 2 months: Single Family Home Lives with:: Spouse Patient language and need for interpreter reviewed:: Yes Do you feel safe going back to the place where you live?: Yes      Need for Family  Participation in Patient Care: Yes (Comment) Care giver support system in place?: Yes (comment)   Criminal Activity/Legal Involvement Pertinent to Current Situation/Hospitalization: No - Comment as needed  Activities of Daily Living   ADL Screening (condition at time of admission) Is the patient deaf or have difficulty hearing?: No Does the patient have difficulty seeing, even when wearing glasses/contacts?: No Does the patient have difficulty concentrating, remembering, or making decisions?: No  Permission Sought/Granted Permission sought to share information with : Facility Medical Sales Representative, Family Supports Permission granted to share information with : Yes, Release of Information Signed  Share Information with NAME: Sencere Symonette, Idell Aisha Pay Capps  Permission granted to share info w AGENCY: Home Health Agencies  Permission granted to share info w Relationship: Wife and daughters  Permission granted to share info w Contact Information: Mervyn: [phone], Idell: [phone], Pay: [phone]  Emotional Assessment Appearance:: Appears stated age Attitude/Demeanor/Rapport: Unable to Assess Affect (typically observed): Unable to Assess Orientation: : Oriented to Self, Oriented to Place, Oriented to  Time Alcohol  / Substance Use: Not Applicable Psych Involvement: No (comment)  Admission diagnosis:  Weakness [R53.1] CAP (community acquired pneumonia) [J18.9] AKI (acute kidney injury) [N17.9] Pneumonia of both lungs due to infectious organism, unspecified part of lung [J18.9] Patient Active Problem List   Diagnosis Date Noted   Dilated cardiomyopathy (HCC) 07/19/2024   Severe aortic valve stenosis 07/19/2024   AKI (acute kidney injury) 07/18/2024   Pleural effusion on right 07/18/2024  Weakness 07/18/2024   Status post thoracentesis 07/18/2024   Sepsis with acute renal failure (HCC) 07/17/2024   Acute metabolic encephalopathy 07/17/2024   Pressure injury of  skin 07/17/2024   Atrial fibrillation with rapid ventricular response (HCC) 07/17/2024   Pneumonia of both lungs due to infectious organism 07/16/2024   Encounter to establish care 03/29/2024   Orthostatic hypotension 03/29/2024   Acute stroke due to ischemia [facility])    TIA (transient ischemic attack) 12/18/2017   Paroxysmal atrial fibrillation (HCC)    Chronic diastolic CHF (congestive heart failure) (HCC)    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 [place] - [address] [place] [address] Phone: [phone] Fax: [phone]     Social Drivers of Health (SDOH) Social History: SDOH Screenings   Food Insecurity: No Food Insecurity (07/16/2024)  Housing: Low Risk (07/16/2024)  Transportation Needs: No Transportation Needs (07/16/2024)  Utilities: Not At Risk (07/16/2024)  Depression (PHQ2-9): Low Risk (07/11/2024)  Social Connections: Moderately Isolated (07/16/2024)  Tobacco Use: High Risk (07/16/2024)   SDOH Interventions:     Readmission Risk Interventions     No data to display

## 2024-07-20 NOTE — Progress Notes (Signed)
 "  Progress Note  Patient Name: Gary Lynn Date of Encounter: 07/20/2024 [facility] Cardiologist: Deatrice Cage, MD   Interval Summary    Patient overall doing much better. S/p b/l thoracentesis on supplemental O2. Appears to have converted to SR this AM.  Vital Signs Vitals:   07/20/24 0009 07/20/24 0010 07/20/24 0532 07/20/24 0856  BP: 110/75  113/71 96/64  Pulse: (!) 124  (!) 124 (!) 118  Resp: (!) 22 18 20 18   Temp: (!) 96.4 F (35.8 C)  97.9 F (36.6 C) (!) 97.2 F (36.2 C)  TempSrc:    Axillary  SpO2: 100%  95% 94%  Weight:      Height:        Intake/Output Summary (Last 24 hours) at 07/20/2024 1023 Last data filed at 07/19/2024 1629 Gross per 24 hour  Intake 135.66 ml  Output 150 ml  Net -14.34 ml      07/17/2024    9:00 AM 07/17/2024    5:00 AM 07/16/2024    1:54 PM  Last 3 Weights  Weight (lbs) 133 lb 13.1 oz 144 lb 6.4 oz 138 lb 14.2 oz  Weight (kg) 60.7 kg 65.5 kg 63 kg      Telemetry/ECG  Afib>SR ~9:30 AM - Personally Reviewed  Physical Exam  GEN: No acute distress.   Neck: No JVD Cardiac: RRR, no murmurs, rubs, or gallops.  Respiratory: Clear to auscultation bilaterally. GI: Soft, nontender, non-distended  MS: No edema  Assessment & Plan   Afib with RVR - originally diagnosed with pAfib in 2017 with intermittent Afib/flutter  - PTA Xarelto  15mg  daily - CHADSVASC at least 6 - in clinic 07/13/24 noted to be in typical aflutter with RVR started on metoprolol  25mg  BID - this admission patient had paroxysmal Afib/flutter - a/c initially held in the setting of acute stroke, but later restarted - patient has been in and out of afib/flutter during admission - this AM converted to NSR,  PACs and PVCs at 9:30AM. EKG to confirm - continue Eliquis  2.5mg BID  CM - EF 25% - unable to exclude tachycardia mediated CM vs underlying ischemia - low BP limiting GDMT - continue metoprolol  - due to recent stroke, debility, not a good candidate  for cath at this time. Can consider ischemic eval as OP  Severe AS - severely depressed EF, low-flow low gradient severely calcified valve - not a good TAVR candidate given debility, recent stroke - palliative following  CVA of right frontal small ischemic nonhemorrhagic stroke/AMS - in the setting of Afib - episodes of delirium  - CT head unremarkable - MRI brain showed 1.9 mm acute ischemic nonhemorrhagic infarct involving the white matter of the posterior right frontal centrum semi ovale  - per neurology  Acute hypoxic respiratory failure Severe RUL PNA and bilateral large pleural effusions Acute on chronic HFpEF - BNP >29,000 - abx per IM - s/p thoracentesis b/l - severele depressed EF likely contributing with rapid afib/flutter - lasix  held for renal failure. BMET pending  Elevated troponin H/o CAD and prior PCI - HS trop 68 >56 - likely demand ischemia form afib and PNA - not consistent with ACS - not currently a candidate for cath  HLD - LDL 43 - Liipitor 40mg  daily  Hypotension - long h/o orthostatic hypotension  GOC - palliative care is following     For questions or updates, please contact [facility] Please consult [email or web address] for contact info under  Signed, Airiana Elman VEAR Fishman, PA-C   "

## 2024-07-20 NOTE — Progress Notes (Addendum)
 "                                                                                                                                                                                                          Daily Progress Note   Patient Name: Gary Lynn       Date: 07/20/2024 DOB: [birth year]  Age: 89 y.o. MRN#: [id number] Attending Physician: Caleen Qualia, MD Primary Care Physician: Weyman Bright, MD Admit Date: 07/16/2024  Reason for Consultation/Follow-up: Establishing goals of care  Subjective: Notes and labs reviewed.  Spoke with attending at length regarding status, updates, and previous discussions with wife.  Into see patient.  He is currently resting in bed with eyes closed, sleeping soundly. His wife and 2 of his daughters are at bedside.  We discussed patient's current status.  Wife discusses desire to get patient home with home health to follow.  Brief discussion by daughters regarding care provided by hospice, and wife is clear she would like to continue life-prolonging care at this time.  Discussed recommendation for outpatient palliative with transition to hospice care when patient and family are ready.  Wife is amenable to this plan.  Daughters stepped out to speak with me upon my departure.  They discussed that they are aware of his poor and very limited prognosis.  Daughter states patient had already advised her that he knew his time was very limited, and that he was ready to go home to heaven.  She discusses that they are a family of faith and she does not want her father to suffer, however they discussed that their mother is the decision maker and will need to see how things go.  Questions answered regarding hospice care at home and hospice facility.  Length of Stay: 4  Current Medications: Scheduled Meds:    stroke: early stages of recovery book   Does not apply Once   apixaban   2.5 mg Oral BID   atorvastatin   40 mg Oral Daily   Chlorhexidine  Gluconate Cloth  6 each  Topical Daily   feeding supplement  237 mL Oral BID BM   finasteride   5 mg Oral Daily   guaiFENesin   600 mg Oral BID   [START ON 07/21/2024] hydrocortisone  sod succinate (SOLU-CORTEF ) inj  30 mg Intravenous Daily   insulin  aspart  0-15 Units Subcutaneous TID WC   insulin  aspart  0-5 Units Subcutaneous QHS   metoprolol  tartrate  25 mg Oral Q6H   nicotine   7 mg Transdermal Daily   tamsulosin   0.4 mg Oral Daily  Continuous Infusions:  piperacillin -tazobactam (ZOSYN )  IV 3.375 g (07/20/24 1253)    PRN Meds: acetaminophen  **OR** acetaminophen , ipratropium-albuterol , metoprolol  tartrate, ondansetron  **OR** ondansetron  (ZOFRAN ) IV  Physical Exam          Vital Signs: BP 108/62 (BP Location: Left Arm)   Pulse 73   Temp 98.6 F (37 C)   Resp 18   Ht 5' 6 (1.676 m)   Wt 60.7 kg   SpO2 99%   BMI 21.60 kg/m  SpO2: SpO2: 99 % O2 Device: O2 Device: Nasal Cannula O2 Flow Rate: O2 Flow Rate (L/min): 2 L/min  Intake/output summary:  Intake/Output Summary (Last 24 hours) at 07/20/2024 1623 Last data filed at 07/20/2024 1000 Gross per 24 hour  Intake 120 ml  Output 150 ml  Net -30 ml   LBM: Last BM Date : 07/20/24 Baseline Weight: Weight: 63.6 kg Most recent weight: Weight: 60.7 kg    Patient Active Problem List   Diagnosis Date Noted   Dilated cardiomyopathy (HCC) 07/19/2024   Severe aortic valve stenosis 07/19/2024   AKI (acute kidney injury) 07/18/2024   Pleural effusion on right 07/18/2024   Weakness 07/18/2024   Status post thoracentesis 07/18/2024   Sepsis with acute renal failure (HCC) 07/17/2024   Acute metabolic encephalopathy 07/17/2024   Pressure injury of skin 07/17/2024   Atrial fibrillation with rapid ventricular response (HCC) 07/17/2024   Pneumonia of both lungs due to infectious organism 07/16/2024   Encounter to establish care 03/29/2024   Orthostatic hypotension 03/29/2024   Acute stroke due to ischemia [facility])    TIA (transient ischemic attack)  12/18/2017   Paroxysmal atrial fibrillation (HCC)    Chronic diastolic CHF (congestive heart failure) (HCC)    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  Daughters state that they would want a company that can provide hospice home placement should patient not be able to die at home.   Code Status:    Code Status Orders  (From admission, onward)           Start     Ordered   07/17/24 1818  Do not attempt resuscitation (DNR)- Limited -Do Not Intubate (DNI)  (Code Status)  Continuous       Question Answer Comment  If pulseless and not breathing No CPR or chest compressions.   In Pre-Arrest Conditions (Patient Is Breathing and Has A Pulse) Do not intubate. Provide all appropriate non-invasive medical interventions. Avoid ICU transfer unless indicated or required.   Consent: Discussion documented in EHR or advanced directives reviewed      07/17/24 1817           Code Status History     Date Active Date Inactive Code Status Order ID Comments User Context   07/16/2024 1723 07/17/2024 1817 Full Code [id number]  Collie Daved BROCKS, DO ED   12/18/2017 1750 12/19/2017 1953 Full Code [id number]  Delane Lye, DO Inpatient       Thank you for allowing the Palliative Medicine Team to assist in the care of this patient.   Time In: 3:00 Time Out: 4:00 Total Time 60 min Prolonged Time Billed  no       Greater than 50%  of this time was spent counseling and coordinating care related to the above assessment and plan.  Camelia Lewis, NP  Please contact Palliative Medicine Team phone  at [phone] for questions and concerns.       "

## 2024-07-20 NOTE — Care Management Important Message (Signed)
 Important Message  Patient Details  Name: Gary Lynn MRN: [id number] Date of Birth: 1931-01-01   Important Message Given:  Yes - Medicare IM     Gary Lynn 07/20/2024, 1:57 PM

## 2024-07-20 NOTE — Plan of Care (Signed)

## 2024-07-20 NOTE — Progress Notes (Signed)
 [facility] Liaison Note  New referral for outpatient palliative services received from Lauraine Carpen, CM.  Patient will discharge with home health and request for Palliative to also follow.  Patient will likely discharge home on Monday. Referral sent into [facility] referral office.  Hospital liaison team will continue to follow through final disposition.  Saddie HILARIO Na, RN Nurse Liaison [phone]

## 2024-07-21 DIAGNOSIS — I420 Dilated cardiomyopathy: Secondary | ICD-10-CM | POA: Diagnosis not present

## 2024-07-21 DIAGNOSIS — I480 Paroxysmal atrial fibrillation: Secondary | ICD-10-CM | POA: Diagnosis not present

## 2024-07-21 DIAGNOSIS — Z7189 Other specified counseling: Secondary | ICD-10-CM | POA: Diagnosis not present

## 2024-07-21 DIAGNOSIS — J90 Pleural effusion, not elsewhere classified: Secondary | ICD-10-CM | POA: Diagnosis not present

## 2024-07-21 DIAGNOSIS — Z789 Other specified health status: Secondary | ICD-10-CM | POA: Diagnosis not present

## 2024-07-21 DIAGNOSIS — I5020 Unspecified systolic (congestive) heart failure: Secondary | ICD-10-CM | POA: Diagnosis not present

## 2024-07-21 DIAGNOSIS — J189 Pneumonia, unspecified organism: Secondary | ICD-10-CM | POA: Diagnosis not present

## 2024-07-21 DIAGNOSIS — G9341 Metabolic encephalopathy: Secondary | ICD-10-CM | POA: Diagnosis not present

## 2024-07-21 DIAGNOSIS — Z515 Encounter for palliative care: Secondary | ICD-10-CM | POA: Diagnosis not present

## 2024-07-21 DIAGNOSIS — Z9889 Other specified postprocedural states: Secondary | ICD-10-CM | POA: Diagnosis not present

## 2024-07-21 DIAGNOSIS — Z66 Do not resuscitate: Secondary | ICD-10-CM | POA: Diagnosis not present

## 2024-07-21 DIAGNOSIS — I350 Nonrheumatic aortic (valve) stenosis: Secondary | ICD-10-CM | POA: Diagnosis not present

## 2024-07-21 DIAGNOSIS — R531 Weakness: Secondary | ICD-10-CM | POA: Diagnosis not present

## 2024-07-21 DIAGNOSIS — N179 Acute kidney failure, unspecified: Secondary | ICD-10-CM | POA: Diagnosis not present

## 2024-07-21 LAB — GLUCOSE, CAPILLARY
Glucose-Capillary: 243 mg/dL — ABNORMAL HIGH (ref 70–99)
Glucose-Capillary: 257 mg/dL — ABNORMAL HIGH (ref 70–99)
Glucose-Capillary: 217 mg/dL — ABNORMAL HIGH (ref 70–99)
Glucose-Capillary: 185 mg/dL — ABNORMAL HIGH (ref 70–99)

## 2024-07-21 LAB — CULTURE, BLOOD (ROUTINE X 2): Culture: NO GROWTH

## 2024-07-21 MED ORDER — HYDROMORPHONE HCL 1 MG/ML IJ SOLN
0.5000 mg | INTRAMUSCULAR | Status: DC | PRN
  Administered 2024-07-21 – 2024-07-22 (×2): 0.5 mg via INTRAVENOUS
  Filled 2024-07-21 (×2): qty 0.5

## 2024-07-21 MED ORDER — MIDODRINE HCL 5 MG PO TABS
10.0000 mg | ORAL_TABLET | Freq: Once | ORAL | Status: AC
  Administered 2024-07-21: 10 mg via ORAL

## 2024-07-21 NOTE — Progress Notes (Signed)
 SLP Cancellation Note  Patient Details Name: Gary Lynn MRN: [id number] DOB: 1931-02-08   Cancelled treatment:       Reason Eval/Treat Not Completed:  (chart reviewed; met w/ Wife at bedside this afternoon. Pt lying in bed moaning - NSG present to address.)  Per chart and Wife's report, pt's POC is now for him to return home w/ Hospice Care as soon as equipment delivery is completed.  D/w Wife ways to support oral comfort via oral care and Pleasure bites/sips, even using sponge swabs to deliver sips. Highlighted finding something like a Milkshake that he would enjoy small tsps of -- Wife fully agreed stating he liked [facility] Frost[employer].   No further acute ST services indicated currently. MD to reconsult if new needs arise during admit. Recommend continue w/ current pureed diet for ease of oral intake. Wife agreed.  NSG present and updated.       [name], MS, CCC-SLP Speech Language Pathologist Rehab Services; [facility] [phone] (ascom) Genette Huertas 07/21/2024, 3:12 PM

## 2024-07-21 NOTE — Progress Notes (Signed)
 [facility]  - [place] [facility]   PATIENT NAME: Gary Lynn    MR#:  [id number]  DATE OF BIRTH:  [birth year]  SUBJECTIVE:  Patient was sleeping with intermittent moaning when seen today.  Family has decided to move on with hospice at home. When asked about pain he said yes but unable to localize the pain.  Appears very lethargic.  VITALS:  Blood pressure (!) 140/120, pulse 92, temperature 97.9 F (36.6 C), temperature source Axillary, resp. rate 20, height 5' 6 (1.676 m), weight 60.7 kg, SpO2 96%.  PHYSICAL EXAMINATION:   General.  Frail and malnourished elderly man, in no acute distress. Pulmonary.  Lungs clear bilaterally, normal respiratory effort. CV.  Regular rate and rhythm, no JVD, rub or murmur. Abdomen.  Soft, nontender, nondistended, BS positive. CNS.  Lethargic, no apparent new deficit Extremities.  No edema,  pulses intact and symmetrical.   LABORATORY PANEL:  CBC Recent Labs  Lab 07/20/24 1212  WBC 16.5*  HGB 10.8*  HCT 35.6*  PLT 178    Chemistries  Recent Labs  Lab 07/17/24 0537 07/18/24 0331 07/20/24 1212  NA 142 144 143  K 4.3 4.3 3.7  CL 108 107 106  CO2 24 24 27   GLUCOSE 198* 173* 262*  BUN 63* 63* 112*  CREATININE 1.82* 1.93* 2.64*  CALCIUM  9.2 8.4* 8.1*  MG 2.3 2.2  --   AST 23  --   --   ALT 36  --   --   ALKPHOS 106  --   --   BILITOT 0.4  --   --    Cardiac Enzymes No results for input(s): TROPONINI in the last 168 hours. RADIOLOGY:  No results found.  Brief Hospital course.  From H and p  Gary Lynn is a 89 y.o. male with medical history significant of CAD s/p stenting, paroxysmal afib on xarelto , skin cancer status post radiation, T2DM presents to the emergency department for evaluation of altered mental status.  He is accompanied by his wife and another family member who provided majority of the history due to patient being hard of hearing.  A few weeks ago, he saw his primary care provider for  a cough and was prescribed azithromycin , Lasix , and Tessalon  Perles. Cough has improved somewhat but persistent.  He finished the azithromycin  a few days ago.   Family states that since he has been taking these new medications, they have noted increased confusion, left sided weakness, increased falls,  slurred speech, yellowing of his skin, and swelling in his lower extremities.They also note he recently finished radiation therapy from skin cancer.   At baseline uses 3 wheeled walker per wife. No falls at home per wife.  1/29: Vitals stable, slight worsening of leukocytosis which can be due to thoracentesis yesterday.  Repeat thoracentesis today. Echocardiogram with significantly reduced EF of 25 to 30%, global hypokinesis and severe AS.  Patient is not a candidate for further intervention due to advanced age and significant underlying comorbidities.  Appropriate for comfort focused care, palliative care is on board.  1/30: Vital stable on 3 L of oxygen.  Maximum home health and home equipments ordered as wife wants him to be at home, likely with outpatient palliative and switch to hospice later on.  Patient is high risk for mortality with multiorgan dysfunction.  1/31: Vital stable on 2 to 3 L of oxygen, family decided to do hospice at home.  Awaiting equipment to be delivered. Hospice is now  on board.  Assessment and Plan:  AMS/acute metabolic encephalopathy Falls  acute CVA right frontal small ischemic non-hemorrhagic stroke - wife describes delirium, at the time of my encounter at baseline mental status.  slurred speech noted in the past few weeks with increasing falls and weakness, concerning for subacute CVA  CT head unremarkable.  - MRI brain 1. 9 mm acute ischemic nonhemorrhagic infarct involving the white matter of the posterior right frontal centrum semi ovale. 2. Underlying age-related cerebral atrophy with mild chronic small vessel ischemic disease, with a few small remote lacunar  infarcts about the bilateral basal ganglia. 3. 9 mm T2 hyperintense lesion within the left parotid gland, indeterminate, but could reflect a primary salivary neoplasm. -Continue Eliquis   -Not on aspirin  due to increased risk of bleeding  - PT/OT/SLP-recommending SNF  -Family now wants to take him home with hospice - fall and delerium precautions    Acute hypoxic respiratory failure  sepsis due to severe right upper lobe pneumonia bilateral large pleural effusion CHF acute on chronic diastolic--BNP 29,000  - with increasing b/l pleural effusions and progression of RUL consolidation  -start patient on Zosyn  and continue doxycycline , completed the course of doxycycline  and we will continue Zosyn  for total of 7-day -- Switching Solu-Medrol with prednisone  for 2 more days -- speech therapy to see patient-recommending dysphagia 1 diet --1/27--Left side Thoracentesis to be done by IR  --1/29: Right-sided thoracentesis with removal of 700 mL Cultures with no organisms seen.  Acute HFrEF. Severe aortic stenosis. Significantly elevated proBNP, echocardiogram with significantly reduced EF of 25%.  Severe aortic stenosis.  Cardiology is on board, not a candidate for further intervention at this time. - Palliative care is on board-family not decided to proceed with hospice.  GDMT cannot be introduced due to AKI  AKI  (baseline creat 1.1 in feb 2025) in the setting of sepsis - Worsening renal function with BUN 112 and creatinine 2.64. Baseline Cr around 1, with peripheral edema and pleural effusions despite recently starting lasix  20mg  daily (per wife)  -   hold nephrotoxic agents  -- monitor input output  Elevated troponin - no CP. Suspect demand  - trend trop, monitor on telemetry  - ECHO with diagnosis of acute HFrEF and severe AS -Not a candidate for cardiac cath   CAD s/p stenting  acute on chronic a fib with RVR  history of paroxysmal afib -[facility] cardiology consulted--cardiology  signed off today -- Low dose BB --Continue with Eliquis    HTN .  Labile blood pressure - resume home medications as appropriate  - PRN hydralazine     T2DM  - SSI    Tobacco abuse  - nicotine  patch   Critically ill.  Palliative care consultation   Spoke with pt's dter and wife  and they request DNR/DNI  Procedures: Family communication : Discussed with wife and son at bedside Consults : neurology, cardiology, IR CODE STATUS: DNR DVT Prophylaxis : Eliquis  Level of care: Progressive Status is: Inpatient Remains inpatient appropriate because: sepsis due to pneumonia, a fib with RVR, CHF, AK I    TOTAL TIME TAKING CARE OF THIS PATIENT: 50 minutes.  >50% time spent on counselling and coordination of care  Note: This dictation was prepared with Dragon dictation along with smaller phrase technology. Any transcriptional errors that result from this process are unintentional.  Amaryllis Dare M.D    [facility]   CC: Primary care physician; Jadali, Fayegh, MD

## 2024-07-21 NOTE — Plan of Care (Signed)

## 2024-07-21 NOTE — Plan of Care (Signed)
  Problem: Clinical Measurements: Goal: Ability to maintain clinical measurements within normal limits will improve Outcome: Progressing   Problem: Elimination: Goal: Will not experience complications related to bowel motility Outcome: Progressing   Problem: Nutritional: Goal: Maintenance of adequate nutrition will improve Outcome: Progressing

## 2024-07-22 DIAGNOSIS — I350 Nonrheumatic aortic (valve) stenosis: Secondary | ICD-10-CM | POA: Diagnosis not present

## 2024-07-22 DIAGNOSIS — Z9889 Other specified postprocedural states: Secondary | ICD-10-CM | POA: Diagnosis not present

## 2024-07-22 DIAGNOSIS — I420 Dilated cardiomyopathy: Secondary | ICD-10-CM | POA: Diagnosis not present

## 2024-07-22 DIAGNOSIS — J189 Pneumonia, unspecified organism: Secondary | ICD-10-CM | POA: Diagnosis not present

## 2024-07-22 DIAGNOSIS — I639 Cerebral infarction, unspecified: Secondary | ICD-10-CM | POA: Diagnosis not present

## 2024-07-22 DIAGNOSIS — I4891 Unspecified atrial fibrillation: Secondary | ICD-10-CM | POA: Diagnosis not present

## 2024-07-22 DIAGNOSIS — I480 Paroxysmal atrial fibrillation: Secondary | ICD-10-CM | POA: Diagnosis not present

## 2024-07-22 DIAGNOSIS — N179 Acute kidney failure, unspecified: Secondary | ICD-10-CM | POA: Diagnosis not present

## 2024-07-22 DIAGNOSIS — J90 Pleural effusion, not elsewhere classified: Secondary | ICD-10-CM | POA: Diagnosis not present

## 2024-07-22 DIAGNOSIS — R531 Weakness: Secondary | ICD-10-CM | POA: Diagnosis not present

## 2024-07-22 DIAGNOSIS — G9341 Metabolic encephalopathy: Secondary | ICD-10-CM | POA: Diagnosis not present

## 2024-07-22 LAB — CBC
HCT: 34.1 % — ABNORMAL LOW (ref 39.0–52.0)
Hemoglobin: 10.1 g/dL — ABNORMAL LOW (ref 13.0–17.0)
MCH: 29.6 pg (ref 26.0–34.0)
MCHC: 29.6 g/dL — ABNORMAL LOW (ref 30.0–36.0)
MCV: 100 fL (ref 80.0–100.0)
Platelets: 152 10*3/uL (ref 150–400)
RBC: 3.41 MIL/uL — ABNORMAL LOW (ref 4.22–5.81)
RDW: 17 % — ABNORMAL HIGH (ref 11.5–15.5)
WBC: 23.8 10*3/uL — ABNORMAL HIGH (ref 4.0–10.5)
nRBC: 0.1 % (ref 0.0–0.2)

## 2024-07-22 LAB — BASIC METABOLIC PANEL WITH GFR
Anion gap: 14 (ref 5–15)
BUN: 166 mg/dL — ABNORMAL HIGH (ref 8–23)
CO2: 25 mmol/L (ref 22–32)
Calcium: 7.7 mg/dL — ABNORMAL LOW (ref 8.9–10.3)
Chloride: 115 mmol/L — ABNORMAL HIGH (ref 98–111)
Creatinine, Ser: 3.78 mg/dL — ABNORMAL HIGH (ref 0.61–1.24)
GFR, Estimated: 14 mL/min — ABNORMAL LOW
Glucose, Bld: 226 mg/dL — ABNORMAL HIGH (ref 70–99)
Potassium: 4.3 mmol/L (ref 3.5–5.1)
Sodium: 155 mmol/L — ABNORMAL HIGH (ref 135–145)

## 2024-07-22 LAB — CULTURE, BLOOD (ROUTINE X 2)
Culture: NO GROWTH
Special Requests: ADEQUATE

## 2024-07-22 LAB — GLUCOSE, CAPILLARY: Glucose-Capillary: 207 mg/dL — ABNORMAL HIGH (ref 70–99)

## 2024-07-22 LAB — HEMOGLOBIN AND HEMATOCRIT, BLOOD
HCT: 30.3 % — ABNORMAL LOW (ref 39.0–52.0)
Hemoglobin: 9.2 g/dL — ABNORMAL LOW (ref 13.0–17.0)

## 2024-07-22 MED ORDER — HALOPERIDOL LACTATE 2 MG/ML PO CONC: 0.5000 mg | ORAL | Status: DC | PRN

## 2024-07-22 MED ORDER — MIDODRINE HCL 5 MG PO TABS: 10.0000 mg | ORAL_TABLET | Freq: Three times a day (TID) | ORAL | Status: DC

## 2024-07-22 MED ORDER — POLYVINYL ALCOHOL 1.4 % OP SOLN: 1.0000 [drp] | Freq: Four times a day (QID) | OPHTHALMIC | Status: DC | PRN

## 2024-07-22 MED ORDER — BIOTENE DRY MOUTH MT LIQD: 15.0000 mL | OROMUCOSAL | Status: DC | PRN

## 2024-07-22 MED ORDER — PIPERACILLIN-TAZOBACTAM IN DEX 2-0.25 GM/50ML IV SOLN
2.2500 g | Freq: Three times a day (TID) | INTRAVENOUS | Status: DC
  Filled 2024-07-22: qty 50

## 2024-07-22 MED ORDER — GLYCOPYRROLATE 1 MG PO TABS: 1.0000 mg | ORAL_TABLET | ORAL | Status: DC | PRN

## 2024-07-22 MED ORDER — GLYCOPYRROLATE 0.2 MG/ML IJ SOLN: 0.2000 mg | INTRAMUSCULAR | Status: DC | PRN

## 2024-07-22 MED ORDER — HALOPERIDOL LACTATE 5 MG/ML IJ SOLN: 0.5000 mg | INTRAMUSCULAR | Status: DC | PRN

## 2024-07-22 MED ORDER — SODIUM CHLORIDE 0.9 % IV SOLN: INTRAVENOUS | Status: DC

## 2024-07-22 MED ORDER — LACTATED RINGERS IV BOLUS
250.0000 mL | Freq: Once | INTRAVENOUS | Status: AC
  Administered 2024-07-22: 250 mL via INTRAVENOUS

## 2024-07-22 MED ORDER — HALOPERIDOL 0.5 MG PO TABS: 0.5000 mg | ORAL_TABLET | ORAL | Status: DC | PRN

## 2024-07-22 DEATH — deceased

## 2024-07-23 LAB — BODY FLUID CULTURE W GRAM STAIN
Culture: NO GROWTH
Gram Stain: NONE SEEN

## 2024-07-26 ENCOUNTER — Ambulatory Visit

## 2024-08-07 ENCOUNTER — Ambulatory Visit: Admitting: Physician Assistant

## 2024-08-19 NOTE — Progress Notes (Signed)
" °   Aug 20, 2024 1200  Spiritual Encounters  Type of Visit Initial  Care provided to: Family  Referral source Nurse (RN/NT/LPN)  Reason for visit End-of-life  OnCall Visit Yes   Chaplain responded to nurse page. Chaplain arrived as patient is passing. Chaplain provided compassionate presence and reflective listening as family shared stories of patient's life. Chaplain encouraged talking about grief and sadness. Chaplain prayed with large family in a circle around patient. Chaplain assisted family with departing and saying goodbye.  "

## 2024-08-19 NOTE — Plan of Care (Signed)
 Referral previously received for PMT to assist with goals of care discussion. Per previous plan, family was planning to take patient home with hospice on 07/23/2024.    After exhibiting signs of end of life earlier this morning, patient was transitioned to comfort measures.   Upon arrival to room, patient had recently passed. Multiple family members were present as well as orthoptist. Emotional support provided to patient's wife and family.    Thank you for your referral and allowing PMT to assist in Mr. Gary Lynn care.      Devere Sacks, ELNITA- [facility] Palliative Medicine Team  08/10/24 1:25 PM  Office [phone]  Pager [phone]   No charge

## 2024-08-19 NOTE — Progress Notes (Addendum)
"  ° °      CROSS COVER NOTE  NAME: Gary Lynn MRN: [id number] DOB : 11/18/1930 ATTENDING PHYSICIAN: Caleen Qualia, MD    Date of Service   07-28-24   HPI/Events of Note   Message received from RN Patient's was admitted AMS, CVA, and pneumonia. He is in A.fib with HR in the 120's. patient has been having continuous Black/very dark watery stool. I placed a rectal tube(FMS). I also took a picture of the stool in th chart just so you know what I am talking about. could we please get an order to check H&H          Today's Vitals   07/28/24 0115 07-28-24 0456 07/28/2024 0500 07/28/24 0505  BP: (!) 98/48  (!) 72/43 (!) 78/50  Pulse: 80  (!) 109   Resp: 20     Temp: 97.9 F (36.6 C)  98 F (36.7 C)   TempSrc: Axillary  Oral   SpO2:      Weight:      Height:      PainSc:  10-Worst pain ever     Body mass index is 21.6 kg/m.   Interventions   Assessment/Plan: Start gentle IVF Check H&H ---> 10.1 Eliquis  Held       To reach the provider On-Call:   7AM- 7PM see care teams to locate the attending and reach out to them via [email or web address] Password: [email or web address] 7PM-7AM contact night-coverage If you still have difficulty reaching the appropriate provider, please page the [facility] (Director on Call) for [facility] on amion for assistance  This document was prepared using Conservation officer, historic buildings and may include unintentional dictation errors.  Rockie Rams  FNP-BC, PMHNP-BC Nurse Practitioner [facility] [place]  "

## 2024-08-19 NOTE — Progress Notes (Signed)
 PHARMACY NOTE:  ANTIMICROBIAL RENAL DOSAGE ADJUSTMENT  Current antimicrobial regimen includes a mismatch between antimicrobial dosage and estimated renal function.  As per policy approved by the Pharmacy & Therapeutics and Medical Executive Committees, the antimicrobial dosage will be adjusted accordingly.  Current antimicrobial dosage:  Zosyn  3.375 g IV Q8H  Indication: Aspiration PNA  Renal Function:  Estimated Creatinine Clearance: 10.9 mL/min (A) (by C-G formula based on SCr of 3.78 mg/dL (H)). []      On intermittent HD, scheduled: []      On CRRT    Antimicrobial dosage has been changed to:  Zosyn  2.25 g IV Q8H  Additional comments:   Thank you for allowing pharmacy to be a part of this patient's care.  Lum DEL [place], [facility] August 08, 2024 12:46 PM

## 2024-08-19 DEATH — deceased

## 2024-09-27 ENCOUNTER — Ambulatory Visit: Admitting: Cardiology

## 2025-01-16 ENCOUNTER — Ambulatory Visit: Admitting: Radiation Oncology
